# Patient Record
Sex: Female | Born: 1945
Health system: Southern US, Community
[De-identification: ages and names within clinical notes are randomized; demographics above are authoritative.]

## PROBLEM LIST (undated history)

## (undated) DIAGNOSIS — E669 Obesity, unspecified: Secondary | ICD-10-CM

## (undated) DIAGNOSIS — E785 Hyperlipidemia, unspecified: Secondary | ICD-10-CM

## (undated) DIAGNOSIS — T7840XA Allergy, unspecified, initial encounter: Secondary | ICD-10-CM

## (undated) DIAGNOSIS — N189 Chronic kidney disease, unspecified: Secondary | ICD-10-CM

## (undated) DIAGNOSIS — I10 Essential (primary) hypertension: Secondary | ICD-10-CM

## (undated) DIAGNOSIS — M1990 Unspecified osteoarthritis, unspecified site: Secondary | ICD-10-CM

## (undated) DIAGNOSIS — K635 Polyp of colon: Secondary | ICD-10-CM

## (undated) DIAGNOSIS — H269 Unspecified cataract: Secondary | ICD-10-CM

## (undated) HISTORY — DX: Chronic kidney disease, unspecified: N18.9

## (undated) HISTORY — DX: Unspecified osteoarthritis, unspecified site: M19.90

## (undated) HISTORY — DX: Obesity, unspecified: E66.9

## (undated) HISTORY — PX: COLON SURGERY: SHX602

## (undated) HISTORY — DX: Essential (primary) hypertension: I10

## (undated) HISTORY — PX: ABDOMINAL HYSTERECTOMY: SHX81

## (undated) HISTORY — DX: Unspecified cataract: H26.9

## (undated) HISTORY — DX: Polyp of colon: K63.5

## (undated) HISTORY — DX: Hyperlipidemia, unspecified: E78.5

## (undated) HISTORY — DX: Allergy, unspecified, initial encounter: T78.40XA

---

## 1998-06-12 ENCOUNTER — Ambulatory Visit (HOSPITAL_COMMUNITY): Admission: RE | Admit: 1998-06-12 | Discharge: 1998-06-12 | Payer: Self-pay | Admitting: Gastroenterology

## 2005-02-10 ENCOUNTER — Ambulatory Visit: Payer: Self-pay | Admitting: Unknown Physician Specialty

## 2005-12-12 ENCOUNTER — Other Ambulatory Visit: Payer: Self-pay

## 2005-12-25 ENCOUNTER — Ambulatory Visit: Payer: Self-pay | Admitting: Unknown Physician Specialty

## 2006-03-02 ENCOUNTER — Ambulatory Visit: Payer: Self-pay | Admitting: Unknown Physician Specialty

## 2006-05-18 ENCOUNTER — Ambulatory Visit: Payer: Self-pay | Admitting: Unknown Physician Specialty

## 2006-05-28 ENCOUNTER — Inpatient Hospital Stay: Payer: Self-pay | Admitting: Unknown Physician Specialty

## 2007-04-26 ENCOUNTER — Ambulatory Visit: Payer: Self-pay | Admitting: Unknown Physician Specialty

## 2008-06-19 ENCOUNTER — Ambulatory Visit: Payer: Self-pay | Admitting: Unknown Physician Specialty

## 2008-09-21 LAB — HM PAP SMEAR: HM Pap smear: NORMAL

## 2009-06-22 ENCOUNTER — Ambulatory Visit: Payer: Self-pay | Admitting: Unknown Physician Specialty

## 2009-07-22 LAB — HM COLONOSCOPY: HM Colonoscopy: NORMAL

## 2009-11-18 LAB — HM DEXA SCAN: HM Dexa Scan: NORMAL

## 2010-12-23 ENCOUNTER — Encounter: Payer: Self-pay | Admitting: Internal Medicine

## 2010-12-23 ENCOUNTER — Ambulatory Visit (INDEPENDENT_AMBULATORY_CARE_PROVIDER_SITE_OTHER): Payer: BC Managed Care – PPO | Admitting: Internal Medicine

## 2010-12-23 VITALS — BP 120/80 | HR 61 | Temp 97.8°F | Resp 12 | Ht 68.5 in | Wt 275.5 lb

## 2010-12-23 DIAGNOSIS — I10 Essential (primary) hypertension: Secondary | ICD-10-CM

## 2010-12-23 DIAGNOSIS — E785 Hyperlipidemia, unspecified: Secondary | ICD-10-CM

## 2010-12-23 DIAGNOSIS — Z0000 Encounter for general adult medical examination without abnormal findings: Secondary | ICD-10-CM

## 2010-12-23 DIAGNOSIS — E669 Obesity, unspecified: Secondary | ICD-10-CM | POA: Insufficient documentation

## 2010-12-23 DIAGNOSIS — E119 Type 2 diabetes mellitus without complications: Secondary | ICD-10-CM

## 2010-12-23 LAB — COMPREHENSIVE METABOLIC PANEL
ALT: 33 U/L (ref 0–35)
Albumin: 3.5 g/dL (ref 3.5–5.2)
Alkaline Phosphatase: 71 U/L (ref 39–117)
Glucose, Bld: 144 mg/dL — ABNORMAL HIGH (ref 70–99)
Potassium: 4.2 mEq/L (ref 3.5–5.1)
Sodium: 138 mEq/L (ref 135–145)
Total Protein: 7 g/dL (ref 6.0–8.3)

## 2010-12-23 LAB — LIPID PANEL
HDL: 42.3 mg/dL (ref >39.00)
Total CHOL/HDL Ratio: 6

## 2010-12-23 LAB — CBC WITH DIFFERENTIAL/PLATELET
Basophils Absolute: 0 10*3/uL (ref 0.0–0.1)
Basophils Relative: 0.7 % (ref 0.0–3.0)
Eosinophils Relative: 3 % (ref 0.0–5.0)
HCT: 38.3 % (ref 36.0–46.0)
Hemoglobin: 13.1 g/dL (ref 12.0–15.0)
Lymphocytes Relative: 17.4 % (ref 12.0–46.0)
Lymphs Abs: 1.1 10*3/uL (ref 0.7–4.0)
Monocytes Relative: 7.1 % (ref 3.0–12.0)
Neutro Abs: 4.6 10*3/uL (ref 1.4–7.7)
RBC: 4.09 Mil/uL (ref 3.87–5.11)
RDW: 13.4 % (ref 11.5–14.6)

## 2010-12-23 LAB — HEMOGLOBIN A1C: Hgb A1c MFr Bld: 6.2 % (ref 4.6–6.5)

## 2010-12-23 LAB — MICROALBUMIN / CREATININE URINE RATIO: Microalb Creat Ratio: 0.1 mg/g (ref 0.0–30.0)

## 2010-12-23 NOTE — Patient Instructions (Signed)
[facility] [place]  Labs today.

## 2010-12-23 NOTE — Progress Notes (Signed)
Subjective:    Patient ID: Savannah Hood, female    DOB: 1945/06/14, 65 y.o.   MRN: [id number]  HPI Savannah Hood is a 65 year old female who presents to establish care. She reports a history in the past of prediabetes which was treated with medication. She reports that she lost a significant amount of weight using weight watchers and was able to come off medication for her diabetes. Subsequently, she has regained her weight and is concerned that her blood sugars may be elevated. She also notes a history of hypertension for which she takes losartan and metoprolol. She has not been regularly checking her blood pressure.  She is concerned about her weight and is interested in starting a diet and exercise program to help lose weight. She currently eats a regular diet and is sedentary.  Outpatient Encounter Prescriptions as of 12/23/2010  Medication Sig Dispense Refill  . aspirin 81 MG tablet Take 81 mg by mouth daily.        . brimonidine (ALPHAGAN P) 0.1 % SOLN Place 1 drop into both eyes 2 (two) times daily.        . Cetirizine HCl (ZYRTEC ALLERGY PO) Take 1 tablet by mouth daily.        Marland Kitchen CINNAMON PO Take 1 capsule by mouth 2 (two) times daily.        . Coenzyme Q10 (COQ10 PO) Take 1 tablet by mouth daily.        Marland Kitchen KRILL OIL PO Take 1 capsule by mouth daily.        Marland Kitchen latanoprost (XALATAN) 0.005 % ophthalmic solution Place 1 drop into both eyes daily.        Marland Kitchen losartan-hydrochlorothiazide (HYZAAR) 100-25 MG per tablet Take 1 tablet by mouth daily.        . LUTEIN PO Take 1 tablet by mouth daily.        . metoprolol (TOPROL-XL) 50 MG 24 hr tablet Take 50 mg by mouth daily.        . Misc Natural Products (TART CHERRY ADVANCED PO) Take 1 capsule by mouth 2 (two) times daily.        . Multiple Vitamin (MULTIVITAMIN) capsule Take 1 capsule by mouth daily.        . Multiple Vitamins-Minerals (PRESERVISION AREDS PO) Take 1 tablet by mouth 2 (two) times daily.        Marland Kitchen nystatin-triamcinolone (MYCOLOG  II) cream Apply 1 application topically as needed.          Review of Systems  Constitutional: Negative for fever, chills, appetite change, fatigue and unexpected weight change.  HENT: Negative for ear pain, congestion, sore throat, trouble swallowing, neck pain, voice change and sinus pressure.   Eyes: Negative for visual disturbance.  Respiratory: Negative for cough, shortness of breath, wheezing and stridor.   Cardiovascular: Negative for chest pain, palpitations and leg swelling.  Gastrointestinal: Negative for nausea, vomiting, abdominal pain, diarrhea, constipation, blood in stool, abdominal distention and anal bleeding.  Genitourinary: Negative for dysuria and flank pain.  Musculoskeletal: Positive for arthralgias. Negative for myalgias and gait problem.  Skin: Negative for color change and rash.  Neurological: Negative for dizziness and headaches.  Hematological: Negative for adenopathy. Does not bruise/bleed easily.  Psychiatric/Behavioral: Negative for suicidal ideas, sleep disturbance and dysphoric mood. The patient is not nervous/anxious.    BP 120/80  Pulse 61  Temp(Src) 97.8 F (36.6 C) (Oral)  Resp 12  Ht 5' 8.5" (1.74 m)  Wt 275 lb 8  oz (124.966 kg)  BMI 41.28 kg/m2  SpO2 98%     Objective:   Physical Exam  Constitutional: She is oriented to person, place, and time. She appears well-developed and well-nourished. No distress.  HENT:  Head: Normocephalic and atraumatic.  Right Ear: External ear normal.  Left Ear: External ear normal.  Nose: Nose normal.  Mouth/Throat: Oropharynx is clear and moist. No oropharyngeal exudate.  Eyes: Conjunctivae are normal. Pupils are equal, round, and reactive to light. Right eye exhibits no discharge. Left eye exhibits no discharge. No scleral icterus.  Neck: Normal range of motion. Neck supple. No tracheal deviation present. No thyromegaly present.  Cardiovascular: Normal rate, regular rhythm, normal heart sounds and intact  distal pulses.  Exam reveals no gallop and no friction rub.   No murmur heard. Pulmonary/Chest: Effort normal and breath sounds normal. No respiratory distress. She has no wheezes. She has no rales. She exhibits no tenderness.  Abdominal: Soft. Bowel sounds are normal. She exhibits no distension and no mass. There is no tenderness. There is no rebound and no guarding.  Musculoskeletal: Normal range of motion. She exhibits no edema and no tenderness.  Lymphadenopathy:    She has no cervical adenopathy.  Neurological: She is alert and oriented to person, place, and time. No cranial nerve deficit. She exhibits normal muscle tone. Coordination normal.  Skin: Skin is warm and dry. No rash noted. She is not diaphoretic. No erythema. No pallor.  Psychiatric: She has a normal mood and affect. Her behavior is normal. Judgment and thought content normal.          Assessment & Plan:  1. Obesity - patient BMI elevated at 41. We discussed goal BMI of 20-25. We discussed calorie counting with a goal of 1200 calories per day for weight loss of 1-2 pounds per week. Given that we watchers worked well for her in the past I encouraged her to consider using this again. I also gave her some resources in regards to healthy diet. We discussed using appetite suppressants, however given her hypertension and use of medications is limited. We will plan to check a set blood work today including CBC, CMP, TSH, hemoglobin A1c, and lipid profile. She will followup in one month.  2. Hypertension - patient with history of hypertension. Blood pressure is currently well controlled on losartan and metoprolol. Will check renal function with labs today. She will return to clinic in one month.  3. Diabetes - patient with history of diabetes in the past. She was previously on medication for this. We'll check hemoglobin A1c with labs.  4. Health Maintenance - will set up mammogram. Will get records on previous Pap smears. Will get  records on previous vaccinations. Patient will followup for physical exam in one month.

## 2010-12-25 ENCOUNTER — Other Ambulatory Visit: Payer: Self-pay | Admitting: Internal Medicine

## 2011-01-17 ENCOUNTER — Ambulatory Visit: Payer: Self-pay | Admitting: Internal Medicine

## 2011-01-19 ENCOUNTER — Other Ambulatory Visit: Payer: Self-pay | Admitting: Internal Medicine

## 2011-01-27 ENCOUNTER — Encounter: Payer: Self-pay | Admitting: Internal Medicine

## 2011-01-27 ENCOUNTER — Ambulatory Visit (INDEPENDENT_AMBULATORY_CARE_PROVIDER_SITE_OTHER): Payer: BC Managed Care – PPO | Admitting: Internal Medicine

## 2011-01-27 VITALS — BP 122/78 | HR 60 | Temp 97.6°F | Wt 271.0 lb

## 2011-01-27 DIAGNOSIS — R739 Hyperglycemia, unspecified: Secondary | ICD-10-CM

## 2011-01-27 DIAGNOSIS — R7309 Other abnormal glucose: Secondary | ICD-10-CM

## 2011-01-27 DIAGNOSIS — N289 Disorder of kidney and ureter, unspecified: Secondary | ICD-10-CM

## 2011-01-27 DIAGNOSIS — Z23 Encounter for immunization: Secondary | ICD-10-CM

## 2011-01-27 DIAGNOSIS — H6690 Otitis media, unspecified, unspecified ear: Secondary | ICD-10-CM

## 2011-01-27 DIAGNOSIS — E785 Hyperlipidemia, unspecified: Secondary | ICD-10-CM

## 2011-01-27 DIAGNOSIS — I10 Essential (primary) hypertension: Secondary | ICD-10-CM

## 2011-01-27 MED ORDER — NYSTATIN-TRIAMCINOLONE 100000-0.1 UNIT/GM-% EX CREA: TOPICAL_CREAM | Freq: Two times a day (BID) | CUTANEOUS | Status: DC

## 2011-01-27 MED ORDER — METOPROLOL SUCCINATE ER 50 MG PO TB24: 50.0000 mg | ORAL_TABLET | Freq: Every day | ORAL | Status: DC

## 2011-01-27 MED ORDER — AMOXICILLIN-POT CLAVULANATE 875-125 MG PO TABS: 1.0000 | ORAL_TABLET | Freq: Two times a day (BID) | ORAL | Status: AC

## 2011-01-27 MED ORDER — LOSARTAN POTASSIUM 100 MG PO TABS: 100.0000 mg | ORAL_TABLET | Freq: Every day | ORAL | Status: DC

## 2011-01-27 NOTE — Patient Instructions (Addendum)
Stop Hyzaar. Start Cozaar. Monitor blood pressure. Call if blood pressure >140/90 consistently.  Increase fluid intake. Decrease intake of saturated fats and sugar. Increase fiber intake. Start antibiotics for ear infection. Call if fever >101F, or worsening pain. Labs in 3 months.

## 2011-01-27 NOTE — Progress Notes (Signed)
Subjective:    Patient ID: Savannah Hood, female    DOB: 07-13-1945, 65 y.o.   MRN: [id number]  HPI 65 year old female presents for her physical exam. She reports that she has been feeling well she denies any complaints today with the exception of some mild left ear pain. She denies any congestion. She denies any fever.  Today we reviewed her lab work from her previous visit which showed an elevated fasting blood sugar at 144 and elevated hemoglobin A1c at 6.2. We discussed that this was consistent with prediabetes. We also reviewed her recent cholesterol numbers including elevated triglycerides which were greater than 300. She reports that she has been on cholesterol medication the past. We also discussed elevated creatinine level on recent labs of 1.3. We discussed that this may be related to dehydration. She has been taking hydrochlorothiazide as part of her regimen for high blood pressure. She reports full compliance with his meds. She also notes that she has limited intake of fluids and often forgets to drink liquids.  Outpatient Encounter Prescriptions as of 01/27/2011  Medication Sig Dispense Refill  . aspirin 81 MG tablet Take 81 mg by mouth daily.        . brimonidine (ALPHAGAN P) 0.1 % SOLN Place 1 drop into both eyes 2 (two) times daily.        Marland Kitchen CINNAMON PO Take 1 capsule by mouth 2 (two) times daily.        . Coenzyme Q10 (COQ10 PO) Take 1 tablet by mouth daily.        Marland Kitchen KRILL OIL PO Take 1 capsule by mouth daily.        Marland Kitchen latanoprost (XALATAN) 0.005 % ophthalmic solution Place 1 drop into both eyes daily.        Marland Kitchen loratadine (CLARITIN) 10 MG tablet Take 10 mg by mouth daily.        . LUTEIN PO Take 1 tablet by mouth daily.        . metoprolol (TOPROL-XL) 50 MG 24 hr tablet Take 1 tablet (50 mg total) by mouth daily.  90 tablet  3  . Misc Natural Products (TART CHERRY ADVANCED PO) Take 1 capsule by mouth 2 (two) times daily.        Marland Kitchen nystatin-triamcinolone (MYCOLOG II) cream Apply  topically 2 (two) times daily.  120 g  3    Review of Systems  Constitutional: Negative for fever, chills, appetite change, fatigue and unexpected weight change.  HENT: Positive for ear pain. Negative for congestion, sore throat, trouble swallowing, neck pain, voice change and sinus pressure.   Eyes: Negative for visual disturbance.  Respiratory: Negative for cough, shortness of breath, wheezing and stridor.   Cardiovascular: Negative for chest pain, palpitations and leg swelling.  Gastrointestinal: Negative for nausea, vomiting, abdominal pain, diarrhea, constipation, blood in stool, abdominal distention and anal bleeding.  Genitourinary: Negative for dysuria and flank pain.  Musculoskeletal: Negative for myalgias, arthralgias and gait problem.  Skin: Negative for color change and rash.  Neurological: Negative for dizziness and headaches.  Hematological: Negative for adenopathy. Does not bruise/bleed easily.  Psychiatric/Behavioral: Negative for suicidal ideas, sleep disturbance and dysphoric mood. The patient is not nervous/anxious.    BP 122/78  Pulse 60  Temp(Src) 97.6 F (36.4 C) (Oral)  Wt 271 lb (122.925 kg)  SpO2 96%     Objective:   Physical Exam  Constitutional: She is oriented to person, place, and time. She appears well-developed and well-nourished. No distress.  HENT:  Head: Normocephalic and atraumatic.  Right Ear: Tympanic membrane, external ear and ear canal normal.  Left Ear: External ear normal. Tympanic membrane is injected. A middle ear effusion is present.  Nose: Nose normal.  Mouth/Throat: Oropharynx is clear and moist. No oropharyngeal exudate.  Eyes: Conjunctivae are normal. Pupils are equal, round, and reactive to light. Right eye exhibits no discharge. Left eye exhibits no discharge. No scleral icterus.  Neck: Normal range of motion. Neck supple. No tracheal deviation present. No thyromegaly present.  Cardiovascular: Normal rate, regular rhythm, normal  heart sounds and intact distal pulses.  Exam reveals no gallop and no friction rub.   No murmur heard. Pulmonary/Chest: Effort normal and breath sounds normal. No respiratory distress. She has no wheezes. She has no rales. She exhibits no tenderness. Right breast exhibits no inverted nipple, no mass, no nipple discharge, no skin change and no tenderness. Left breast exhibits no mass, no nipple discharge, no skin change and no tenderness. Breasts are symmetrical.  Musculoskeletal: Normal range of motion. She exhibits no edema and no tenderness.  Lymphadenopathy:    She has no cervical adenopathy.  Neurological: She is alert and oriented to person, place, and time. No cranial nerve deficit. She exhibits normal muscle tone. Coordination normal.  Skin: Skin is warm and dry. No rash noted. She is not diaphoretic. No erythema. No pallor.  Psychiatric: She has a normal mood and affect. Her behavior is normal. Judgment and thought content normal.          Assessment & Plan:  1. General exam -exam including breast exam is normal today. Pap was deferred because patient is status post complete hysterectomy. We reviewed recent labs as above.  2. Otitis media - exam remarkable for left otitis media. Will treat with Augmentin. Patient will call if symptoms are not improving.  3. Hyperglycemia -we discussed hyperglycemia and that patient's fasting blood sugar was consistent with diabetes. She is making effort at improving diet by reducing sugar in her diet and increasing her intake of fiber. We will plan to repeat her fasting blood sugar and hemoglobin A1c in 3 months.  4. Hypertriglyceridemia -we discussed elevated triglycerides. We discussed increasing fiber and decreasing saturated fat in her diet. We also discussed him creasing physical activity. We will plan to repeat lipids in 3 months.  5. Renal insufficiency -we discussed elevated creatinine on labs. This seems most consistent with dehydration. Will  try stopping her hydrochlorothiazide and repeating renal function in 3 months.  6. Hypertension - as above, will change Hyzaar to Cozaar, removing hydrochlorothiazide because of dehydration on recent labs. She will continue to monitor blood pressure and call if blood pressure greater than 140/90.

## 2011-02-12 ENCOUNTER — Encounter: Payer: Self-pay | Admitting: Internal Medicine

## 2011-04-09 ENCOUNTER — Ambulatory Visit (INDEPENDENT_AMBULATORY_CARE_PROVIDER_SITE_OTHER): Payer: BC Managed Care – PPO | Admitting: *Deleted

## 2011-04-09 VITALS — BP 186/90 | HR 64 | Wt 263.0 lb

## 2011-04-09 DIAGNOSIS — I10 Essential (primary) hypertension: Secondary | ICD-10-CM

## 2011-04-09 MED ORDER — AMLODIPINE BESYLATE 10 MG PO TABS: 10.0000 mg | ORAL_TABLET | Freq: Every day | ORAL | Status: DC

## 2011-04-09 NOTE — Progress Notes (Signed)
Pt came in for BP check.  It was 177/84 at her pharmacy yesterday.  Today it is 186/90 in office.  Per Dr. Dan Humphreys, instructed pt to add amlodipine 5 mg's to other meds she is taking and come back in one week for recheck.  Medicine sent to [facility].

## 2011-05-05 ENCOUNTER — Encounter: Payer: Self-pay | Admitting: Internal Medicine

## 2011-05-05 ENCOUNTER — Ambulatory Visit (INDEPENDENT_AMBULATORY_CARE_PROVIDER_SITE_OTHER): Payer: BC Managed Care – PPO | Admitting: Internal Medicine

## 2011-05-05 DIAGNOSIS — E785 Hyperlipidemia, unspecified: Secondary | ICD-10-CM

## 2011-05-05 DIAGNOSIS — H6692 Otitis media, unspecified, left ear: Secondary | ICD-10-CM

## 2011-05-05 DIAGNOSIS — E119 Type 2 diabetes mellitus without complications: Secondary | ICD-10-CM

## 2011-05-05 DIAGNOSIS — H6690 Otitis media, unspecified, unspecified ear: Secondary | ICD-10-CM

## 2011-05-05 DIAGNOSIS — I10 Essential (primary) hypertension: Secondary | ICD-10-CM

## 2011-05-05 DIAGNOSIS — R7309 Other abnormal glucose: Secondary | ICD-10-CM

## 2011-05-05 DIAGNOSIS — E669 Obesity, unspecified: Secondary | ICD-10-CM

## 2011-05-05 LAB — COMPREHENSIVE METABOLIC PANEL
ALT: 83 U/L — ABNORMAL HIGH (ref 0–35)
Alkaline Phosphatase: 104 U/L (ref 39–117)
CO2: 25 mEq/L (ref 19–32)
Creatinine, Ser: 1 mg/dL (ref 0.4–1.2)
GFR: 58.03 mL/min — ABNORMAL LOW (ref >60.00)
Glucose, Bld: 116 mg/dL — ABNORMAL HIGH (ref 70–99)
Sodium: 134 mEq/L — ABNORMAL LOW (ref 135–145)
Total Bilirubin: 0.6 mg/dL (ref 0.3–1.2)

## 2011-05-05 LAB — LIPID PANEL
Cholesterol: 233 mg/dL — ABNORMAL HIGH (ref 0–200)
HDL: 46.8 mg/dL (ref >39.00)
Total CHOL/HDL Ratio: 5
VLDL: 31.2 mg/dL (ref 0.0–40.0)

## 2011-05-05 LAB — LDL CHOLESTEROL, DIRECT: Direct LDL: 150.5 mg/dL

## 2011-05-05 MED ORDER — AMOXICILLIN-POT CLAVULANATE 875-125 MG PO TABS: 1.0000 | ORAL_TABLET | Freq: Two times a day (BID) | ORAL | Status: AC

## 2011-05-05 NOTE — Assessment & Plan Note (Signed)
Blood pressure is well-controlled today. We'll check renal function with labs. Followup 6 months.

## 2011-05-05 NOTE — Assessment & Plan Note (Signed)
Patient reports making significant improvement in her diet. However, weight is unchanged. Encouraged continued efforts at improvement dietary choices and regular physical activity.

## 2011-05-05 NOTE — Progress Notes (Signed)
Subjective:    Patient ID: Savannah Hood, female    DOB: April 09, 1945, 66 y.o.   MRN: [id number]  HPI 66 year old female with history of hypertension, hyperlipidemia, diabetes, obesity presents for followup. She reports that generally she is doing well. In regards to her hypertension, she notes significant improvement in her blood pressures with the addition of amlodipine. She denies any chest pain, palpitations, headache.  In regards to her hyperlipidemia, elevated blood sugars, and obesity, she notes taking some efforts at improving her diet over the last few months. She is interested in having her A1c and cholesterol rechecked today. She has not had any significant weight loss. She continues to be sedentary.  She notes a one-week history of nasal congestion and left ear pain. She reports that she's been using over-the-counter Claritin with minimal improvement. She tried changing to [employer] with no improvement as well. She denies any cough, shortness of breath, fever, or chills. She notes a history of otitis media in her left ear this fall.  Outpatient Encounter Prescriptions as of 05/05/2011  Medication Sig Dispense Refill  . Alpha-Lipoic Acid 200 MG TABS Take by mouth daily.      Marland Kitchen amLODipine (NORVASC) 10 MG tablet Take 1 tablet (10 mg total) by mouth daily.  30 tablet  1  . aspirin 81 MG tablet Take 81 mg by mouth daily.        . brimonidine (ALPHAGAN P) 0.1 % SOLN Place 1 drop into both eyes 2 (two) times daily.        . cholecalciferol (VITAMIN D) 1000 UNITS tablet Take 2,000 Units by mouth daily.      Marland Kitchen CINNAMON PO Take 1 capsule by mouth 2 (two) times daily.        . Coenzyme Q10 (COQ10 PO) Take 1 tablet by mouth daily.        . fexofenadine (ALLEGRA) 180 MG tablet Take 180 mg by mouth daily.      Marland Kitchen KRILL OIL PO Take 1 capsule by mouth daily.        Marland Kitchen latanoprost (XALATAN) 0.005 % ophthalmic solution Place 1 drop into both eyes daily.        Marland Kitchen losartan (COZAAR) 100 MG tablet Take 1  tablet (100 mg total) by mouth daily.  90 tablet  4  . LUTEIN PO Take 1 tablet by mouth daily.        . metoprolol (TOPROL-XL) 50 MG 24 hr tablet Take 1 tablet (50 mg total) by mouth daily.  90 tablet  3  . Misc Natural Products (TART CHERRY ADVANCED PO) Take 1 capsule by mouth 2 (two) times daily.        . Multiple Vitamins-Minerals (ICAPS MV) TABS Take by mouth daily.      Marland Kitchen nystatin-triamcinolone (MYCOLOG II) cream Apply topically 2 (two) times daily.  120 g  3  . amoxicillin-clavulanate (AUGMENTIN) 875-125 MG per tablet Take 1 tablet by mouth 2 (two) times daily.  20 tablet  0  . DISCONTD: loratadine (CLARITIN) 10 MG tablet Take 10 mg by mouth daily.          Review of Systems  Constitutional: Negative for fever, chills, appetite change, fatigue and unexpected weight change.  HENT: Positive for ear pain and congestion. Negative for sore throat, rhinorrhea, trouble swallowing, neck pain, voice change, postnasal drip and sinus pressure.   Eyes: Negative for visual disturbance.  Respiratory: Negative for cough, shortness of breath, wheezing and stridor.   Cardiovascular: Negative for chest  pain, palpitations and leg swelling.  Gastrointestinal: Negative for nausea, vomiting, abdominal pain, diarrhea, constipation, blood in stool, abdominal distention and anal bleeding.  Genitourinary: Negative for dysuria and flank pain.  Musculoskeletal: Negative for myalgias, arthralgias and gait problem.  Skin: Negative for color change and rash.  Neurological: Negative for dizziness and headaches.  Hematological: Negative for adenopathy. Does not bruise/bleed easily.  Psychiatric/Behavioral: Negative for suicidal ideas, sleep disturbance and dysphoric mood. The patient is not nervous/anxious.    BP 118/70  Pulse 78  Temp(Src) 97.4 F (36.3 C) (Oral)  Ht 5\' 7"  (1.702 m)  Wt 272 lb (123.378 kg)  BMI 42.60 kg/m2  SpO2 100%     Objective:   Physical Exam  Constitutional: She is oriented to  person, place, and time. She appears well-developed and well-nourished. No distress.  HENT:  Head: Normocephalic and atraumatic.  Right Ear: External ear normal. Tympanic membrane is not erythematous. No middle ear effusion.  Left Ear: External ear normal. Tympanic membrane is erythematous. A middle ear effusion is present.  Nose: Nose normal.  Mouth/Throat: Oropharynx is clear and moist. No oropharyngeal exudate.  Eyes: Conjunctivae are normal. Pupils are equal, round, and reactive to light. Right eye exhibits no discharge. Left eye exhibits no discharge. No scleral icterus.  Neck: Normal range of motion. Neck supple. No tracheal deviation present. No thyromegaly present.  Cardiovascular: Normal rate, regular rhythm, normal heart sounds and intact distal pulses.  Exam reveals no gallop and no friction rub.   No murmur heard. Pulmonary/Chest: Effort normal and breath sounds normal. No respiratory distress. She has no wheezes. She has no rales. She exhibits no tenderness.  Musculoskeletal: Normal range of motion. She exhibits no edema and no tenderness.  Lymphadenopathy:    She has no cervical adenopathy.  Neurological: She is alert and oriented to person, place, and time. No cranial nerve deficit. She exhibits normal muscle tone. Coordination normal.  Skin: Skin is warm and dry. No rash noted. She is not diaphoretic. No erythema. No pallor.  Psychiatric: She has a normal mood and affect. Her behavior is normal. Judgment and thought content normal.          Assessment & Plan:

## 2011-05-05 NOTE — Assessment & Plan Note (Signed)
Noted on labs in November 2012. Patient has made significant changes to her diet. Will recheck lipids today. Goal LDL less than 100.

## 2011-05-05 NOTE — Assessment & Plan Note (Signed)
Symptoms and exam consistent with left otitis media. Patient feels that symptoms are gradually improving. She will continue to monitor. If symptoms persist over the next 48 hours or worsen, she will start Augmentin twice daily for 10 days. She will call if symptoms do not resolve.

## 2011-05-05 NOTE — Assessment & Plan Note (Signed)
Last A1c was elevated at 6.2%. Will check A1c today. Patient has made significant changes in her diet. Followup in 6 months or sooner if needed.

## 2011-05-07 ENCOUNTER — Encounter: Payer: Self-pay | Admitting: Internal Medicine

## 2011-06-06 ENCOUNTER — Other Ambulatory Visit: Payer: Self-pay | Admitting: Internal Medicine

## 2011-07-07 ENCOUNTER — Other Ambulatory Visit: Payer: Self-pay | Admitting: *Deleted

## 2011-07-07 MED ORDER — NYSTATIN-TRIAMCINOLONE 100000-0.1 UNIT/GM-% EX CREA: TOPICAL_CREAM | Freq: Two times a day (BID) | CUTANEOUS | Status: DC

## 2011-07-11 LAB — HM MAMMOGRAPHY: HM Mammogram: NORMAL

## 2011-08-02 ENCOUNTER — Other Ambulatory Visit: Payer: Self-pay | Admitting: Internal Medicine

## 2011-09-28 ENCOUNTER — Other Ambulatory Visit: Payer: Self-pay | Admitting: Internal Medicine

## 2011-11-20 ENCOUNTER — Ambulatory Visit (INDEPENDENT_AMBULATORY_CARE_PROVIDER_SITE_OTHER): Payer: BC Managed Care – PPO | Admitting: Internal Medicine

## 2011-11-20 ENCOUNTER — Encounter: Payer: Self-pay | Admitting: Internal Medicine

## 2011-11-20 VITALS — BP 102/70 | HR 59 | Temp 97.9°F | Ht 67.0 in | Wt 263.0 lb

## 2011-11-20 DIAGNOSIS — Z2911 Encounter for prophylactic immunotherapy for respiratory syncytial virus (RSV): Secondary | ICD-10-CM

## 2011-11-20 DIAGNOSIS — Z23 Encounter for immunization: Secondary | ICD-10-CM

## 2011-11-20 DIAGNOSIS — R739 Hyperglycemia, unspecified: Secondary | ICD-10-CM

## 2011-11-20 DIAGNOSIS — I10 Essential (primary) hypertension: Secondary | ICD-10-CM

## 2011-11-20 DIAGNOSIS — E669 Obesity, unspecified: Secondary | ICD-10-CM

## 2011-11-20 DIAGNOSIS — E785 Hyperlipidemia, unspecified: Secondary | ICD-10-CM

## 2011-11-20 DIAGNOSIS — E7849 Other hyperlipidemia: Secondary | ICD-10-CM | POA: Insufficient documentation

## 2011-11-20 DIAGNOSIS — R7309 Other abnormal glucose: Secondary | ICD-10-CM

## 2011-11-20 DIAGNOSIS — R7303 Prediabetes: Secondary | ICD-10-CM | POA: Insufficient documentation

## 2011-11-20 LAB — COMPREHENSIVE METABOLIC PANEL
AST: 27 U/L (ref 0–37)
Albumin: 3.8 g/dL (ref 3.5–5.2)
Alkaline Phosphatase: 95 U/L (ref 39–117)
Potassium: 4.2 mEq/L (ref 3.5–5.1)
Sodium: 139 mEq/L (ref 135–145)
Total Protein: 7 g/dL (ref 6.0–8.3)

## 2011-11-20 LAB — HEMOGLOBIN A1C: Hgb A1c MFr Bld: 5.7 % (ref 4.6–6.5)

## 2011-11-20 LAB — LIPID PANEL: Total CHOL/HDL Ratio: 6

## 2011-11-20 NOTE — Patient Instructions (Signed)
[email or web address]  

## 2011-11-20 NOTE — Assessment & Plan Note (Signed)
Blood sugars have been slightly elevated with A1c in the prediabetes range. Encourage continued efforts at healthy diet and regular physical activity. Followup 6 months.

## 2011-11-20 NOTE — Assessment & Plan Note (Signed)
Blood pressure well-controlled on current medications. Will plan to continue. Recent renal function was normal. Followup 6 months.

## 2011-11-20 NOTE — Assessment & Plan Note (Signed)
Congratulated patient on nearly 10 pound weight loss. Encouraged her to continue efforts at healthy diet and regular physical activity. Followup 6 months.

## 2011-11-20 NOTE — Progress Notes (Signed)
Subjective:    Patient ID: Savannah Hood, female    DOB: 17-Apr-1945, 66 y.o.   MRN: [id number]  HPI 66 year old female with history of hypertension, hyperlipidemia, elevated blood sugars, and obesity presents for followup. She reports she is generally doing well. She has been following [employer] for the last several months and has lost nearly 10 pounds. She is also trying to incorporate more physical activity. In regards to hypertension, she reports full compliance with medication. She denies any chest pain, palpitations, headache. She has no new concerns today.  Outpatient Encounter Prescriptions as of 11/20/2011  Medication Sig Dispense Refill  . Alpha-Lipoic Acid 200 MG TABS Take by mouth daily.      Marland Kitchen amLODipine (NORVASC) 10 MG tablet TAKE 1 TABLET BY MOUTH EVERY DAY  30 tablet  1  . aspirin 81 MG tablet Take 81 mg by mouth daily.        . brimonidine (ALPHAGAN P) 0.1 % SOLN Place 1 drop into both eyes 2 (two) times daily.        . cholecalciferol (VITAMIN D) 1000 UNITS tablet Take 2,000 Units by mouth daily.      Marland Kitchen CINNAMON PO Take 1 capsule by mouth 2 (two) times daily.        . Coenzyme Q10 (COQ10 PO) Take 1 tablet by mouth daily.        . fexofenadine (ALLEGRA) 180 MG tablet Take 180 mg by mouth daily.      Marland Kitchen KRILL OIL PO Take 1 capsule by mouth daily.        Marland Kitchen latanoprost (XALATAN) 0.005 % ophthalmic solution Place 1 drop into both eyes daily.        Marland Kitchen losartan (COZAAR) 100 MG tablet Take 1 tablet (100 mg total) by mouth daily.  90 tablet  4  . LUTEIN PO Take 1 tablet by mouth daily.        . metoprolol (TOPROL-XL) 50 MG 24 hr tablet Take 1 tablet (50 mg total) by mouth daily.  90 tablet  3  . Misc Natural Products (TART CHERRY ADVANCED PO) Take 1 capsule by mouth 2 (two) times daily.        . Multiple Vitamins-Minerals (ICAPS MV) TABS Take by mouth daily.      Marland Kitchen nystatin-triamcinolone (MYCOLOG II) cream APPLY TOPICALLY TWICE A DAY  120 g  3    Review of Systems    Constitutional: Negative for fever, chills, appetite change, fatigue and unexpected weight change.  HENT: Negative for ear pain, congestion, sore throat, trouble swallowing, neck pain, voice change and sinus pressure.   Eyes: Negative for visual disturbance.  Respiratory: Negative for cough, shortness of breath, wheezing and stridor.   Cardiovascular: Negative for chest pain, palpitations and leg swelling.  Gastrointestinal: Negative for nausea, vomiting, abdominal pain, diarrhea, constipation, blood in stool, abdominal distention and anal bleeding.  Genitourinary: Negative for dysuria and flank pain.  Musculoskeletal: Negative for myalgias, arthralgias and gait problem.  Skin: Negative for color change and rash.  Neurological: Negative for dizziness and headaches.  Hematological: Negative for adenopathy. Does not bruise/bleed easily.  Psychiatric/Behavioral: Negative for suicidal ideas, disturbed wake/sleep cycle and dysphoric mood. The patient is not nervous/anxious.        Objective:   Physical Exam  Constitutional: She is oriented to person, place, and time. She appears well-developed and well-nourished. No distress.  HENT:  Head: Normocephalic and atraumatic.  Right Ear: External ear normal.  Left Ear: External ear normal.  Nose: Nose normal.  Mouth/Throat: Oropharynx is clear and moist. No oropharyngeal exudate.  Eyes: Conjunctivae are normal. Pupils are equal, round, and reactive to light. Right eye exhibits no discharge. Left eye exhibits no discharge. No scleral icterus.  Neck: Normal range of motion. Neck supple. No tracheal deviation present. No thyromegaly present.  Cardiovascular: Normal rate, regular rhythm, normal heart sounds and intact distal pulses.  Exam reveals no gallop and no friction rub.   No murmur heard. Pulmonary/Chest: Effort normal and breath sounds normal. No respiratory distress. She has no wheezes. She has no rales. She exhibits no tenderness.   Musculoskeletal: Normal range of motion. She exhibits no edema and no tenderness.  Lymphadenopathy:    She has no cervical adenopathy.  Neurological: She is alert and oriented to person, place, and time. No cranial nerve deficit. She exhibits normal muscle tone. Coordination normal.  Skin: Skin is warm and dry. No rash noted. She is not diaphoretic. No erythema. No pallor.  Psychiatric: She has a normal mood and affect. Her behavior is normal. Judgment and thought content normal.          Assessment & Plan:

## 2011-11-20 NOTE — Assessment & Plan Note (Signed)
Lipids elevated above goal with LDL 150. Patient is working on dietary and exercise interventions in an effort to lose weight and improve cholesterol. We'll plan to repeat lipids in 6 months.

## 2011-11-21 ENCOUNTER — Encounter: Payer: Self-pay | Admitting: Internal Medicine

## 2011-11-28 ENCOUNTER — Other Ambulatory Visit: Payer: Self-pay | Admitting: Internal Medicine

## 2011-12-30 ENCOUNTER — Other Ambulatory Visit: Payer: Self-pay | Admitting: Internal Medicine

## 2012-01-11 ENCOUNTER — Other Ambulatory Visit: Payer: Self-pay | Admitting: Internal Medicine

## 2012-01-29 ENCOUNTER — Other Ambulatory Visit: Payer: Self-pay | Admitting: Internal Medicine

## 2012-03-11 ENCOUNTER — Telehealth: Payer: Self-pay | Admitting: Internal Medicine

## 2012-03-11 NOTE — Telephone Encounter (Signed)
Losartan Potassium 100 mg tablet  Take 1 tablet by mouth every   # 90

## 2012-03-12 MED ORDER — LOSARTAN POTASSIUM 100 MG PO TABS: 100.0000 mg | ORAL_TABLET | Freq: Every day | ORAL | Status: DC

## 2012-03-12 NOTE — Telephone Encounter (Signed)
Med filled.  

## 2012-05-08 ENCOUNTER — Other Ambulatory Visit: Payer: Self-pay

## 2012-05-26 ENCOUNTER — Encounter: Payer: BC Managed Care – PPO | Admitting: Internal Medicine

## 2012-07-13 ENCOUNTER — Other Ambulatory Visit: Payer: Self-pay | Admitting: Internal Medicine

## 2012-08-09 ENCOUNTER — Encounter: Payer: Self-pay | Admitting: Internal Medicine

## 2012-08-09 ENCOUNTER — Ambulatory Visit (INDEPENDENT_AMBULATORY_CARE_PROVIDER_SITE_OTHER): Payer: BC Managed Care – PPO | Admitting: Internal Medicine

## 2012-08-09 VITALS — BP 110/72 | HR 56 | Temp 97.8°F | Ht 67.5 in | Wt 270.0 lb

## 2012-08-09 DIAGNOSIS — E669 Obesity, unspecified: Secondary | ICD-10-CM

## 2012-08-09 DIAGNOSIS — I10 Essential (primary) hypertension: Secondary | ICD-10-CM

## 2012-08-09 DIAGNOSIS — B3789 Other sites of candidiasis: Secondary | ICD-10-CM | POA: Insufficient documentation

## 2012-08-09 DIAGNOSIS — Z0000 Encounter for general adult medical examination without abnormal findings: Secondary | ICD-10-CM

## 2012-08-09 DIAGNOSIS — B372 Candidiasis of skin and nail: Secondary | ICD-10-CM

## 2012-08-09 LAB — HM PAP SMEAR

## 2012-08-09 LAB — COMPREHENSIVE METABOLIC PANEL
ALT: 27 U/L (ref 0–35)
AST: 23 U/L (ref 0–37)
Albumin: 3.5 g/dL (ref 3.5–5.2)
Calcium: 9 mg/dL (ref 8.4–10.5)
Chloride: 104 mEq/L (ref 96–112)
Potassium: 4.4 mEq/L (ref 3.5–5.1)
Sodium: 137 mEq/L (ref 135–145)
Total Protein: 6.9 g/dL (ref 6.0–8.3)

## 2012-08-09 LAB — CBC WITH DIFFERENTIAL/PLATELET
Basophils Absolute: 0 10*3/uL (ref 0.0–0.1)
Eosinophils Relative: 2.1 % (ref 0.0–5.0)
HCT: 38.3 % (ref 36.0–46.0)
Lymphocytes Relative: 14.7 % (ref 12.0–46.0)
Lymphs Abs: 1.1 10*3/uL (ref 0.7–4.0)
Monocytes Relative: 5.4 % (ref 3.0–12.0)
Platelets: 181 10*3/uL (ref 150.0–400.0)
RDW: 13.3 % (ref 11.5–14.6)
WBC: 7.4 10*3/uL (ref 4.5–10.5)

## 2012-08-09 LAB — LIPID PANEL: HDL: 36.8 mg/dL — ABNORMAL LOW (ref >39.00)

## 2012-08-09 LAB — LDL CHOLESTEROL, DIRECT: Direct LDL: 143.3 mg/dL

## 2012-08-09 MED ORDER — NYSTATIN 100000 UNIT/GM EX POWD: Freq: Four times a day (QID) | CUTANEOUS | Status: DC

## 2012-08-09 MED ORDER — FLUCONAZOLE 150 MG PO TABS: 150.0000 mg | ORAL_TABLET | Freq: Every day | ORAL | Status: DC

## 2012-08-09 NOTE — Assessment & Plan Note (Signed)
Exam is consistent with topical candidiasis. Will change to topical nystatin powder and will start oral diflucan for 7 day course. Encouraged pt to keep area dry as much as possible. She will call or email with update later this week.

## 2012-08-09 NOTE — Assessment & Plan Note (Signed)
Wt Readings from Last 3 Encounters:  08/09/12 270 lb (122.471 kg)  11/20/11 263 lb (119.296 kg)  05/05/11 272 lb (123.378 kg)   Encouraged effort at healthy diet and prioritizing exercise such as walking with goal of 5 days per week.

## 2012-08-09 NOTE — Assessment & Plan Note (Signed)
General medical exam including breast exam normal today. PAP and pelvic deferred as pt s/p complete hysterectomy.  Mammogram ordered. Will check labs including CBC, CMP, lipids. Encouraged continued efforts at healthy diet and regular physical activity with goal of weight loss.

## 2012-08-09 NOTE — Assessment & Plan Note (Signed)
BP Readings from Last 3 Encounters:  08/09/12 110/72  11/20/11 102/70  05/05/11 118/70   BP well controlled on current medications. Will continue.

## 2012-08-09 NOTE — Progress Notes (Signed)
Subjective:    Patient ID: Savannah Hood, female    DOB: February 21, 1946, 67 y.o.   MRN: [id number]  HPI 67YO female presents for annual exam. Doing well. Only concern today is rash in left groin area. This has been present off and on for greater than 1 year. Using topical Nystatin cream with minimal improvement. Rash tends to come and go. Becomes occasionally bright red and tender. No fever, chills. Also had rash under breasts in the past, however this has cleared.  Also reports ongoing struggle with weight loss. Not following any particular diet. Not currently exercising. However, plans to start increased walking.  Outpatient Encounter Prescriptions as of 08/09/2012  Medication Sig Dispense Refill  . Alpha-Lipoic Acid 200 MG TABS Take by mouth daily.      Marland Kitchen amLODipine (NORVASC) 10 MG tablet TAKE 1 TABLET BY MOUTH EVERY DAY  30 tablet  6  . b complex vitamins capsule Take 1 capsule by mouth daily.      . brimonidine (ALPHAGAN P) 0.1 % SOLN Place 1 drop into both eyes 2 (two) times daily.        . cetirizine (ZYRTEC) 10 MG tablet Take 10 mg by mouth daily.      . cholecalciferol (VITAMIN D) 1000 UNITS tablet Take 2,000 Units by mouth daily.      Marland Kitchen CINNAMON PO Take 1 capsule by mouth 2 (two) times daily.        Marland Kitchen KRILL OIL PO Take 1 capsule by mouth daily.        Marland Kitchen latanoprost (XALATAN) 0.005 % ophthalmic solution Place 1 drop into both eyes daily.        Marland Kitchen losartan (COZAAR) 100 MG tablet Take 1 tablet (100 mg total) by mouth daily.  90 tablet  4  . LUTEIN PO Take 1 tablet by mouth daily.        . metoprolol succinate (TOPROL-XL) 50 MG 24 hr tablet TAKE 1 TABLET BY MOUTH ONCE A DAY  90 tablet  3  . Multiple Vitamins-Minerals (ICAPS MV) TABS Take by mouth daily.      Marland Kitchen nystatin-triamcinolone (MYCOLOG II) cream APPLY TOPICALLY TWICE A DAY  120 g  3  . aspirin 81 MG tablet Take 81 mg by mouth daily.        . Coenzyme Q10 (COQ10 PO) Take 1 tablet by mouth daily.        . fexofenadine (ALLEGRA) 180  MG tablet Take 180 mg by mouth daily.      . fluconazole (DIFLUCAN) 150 MG tablet Take 1 tablet (150 mg total) by mouth daily.  7 tablet  1  . Misc Natural Products (TART CHERRY ADVANCED PO) Take 1 capsule by mouth 2 (two) times daily.        Marland Kitchen nystatin (MYCOSTATIN) powder Apply topically 4 (four) times daily.  60 g  4  . nystatin-triamcinolone (MYCOLOG II) cream APPLY TO AFFECTED AREA TWICE A DAY  120 g  3   No facility-administered encounter medications on file as of 08/09/2012.   BP 110/72  Pulse 56  Temp(Src) 97.8 F (36.6 C) (Oral)  Ht 5' 7.5" (1.715 m)  Wt 270 lb (122.471 kg)  BMI 41.64 kg/m2  SpO2 97%  Review of Systems  Constitutional: Negative for fever, chills, appetite change, fatigue and unexpected weight change.  HENT: Negative for ear pain, congestion, sore throat, trouble swallowing, neck pain, voice change and sinus pressure.   Eyes: Negative for visual disturbance.  Respiratory: Negative for  cough, shortness of breath, wheezing and stridor.   Cardiovascular: Negative for chest pain, palpitations and leg swelling.  Gastrointestinal: Negative for nausea, vomiting, abdominal pain, diarrhea, constipation, blood in stool, abdominal distention and anal bleeding.  Genitourinary: Negative for dysuria and flank pain.  Musculoskeletal: Negative for myalgias, arthralgias and gait problem.  Skin: Positive for color change and rash.  Neurological: Negative for dizziness and headaches.  Hematological: Negative for adenopathy. Does not bruise/bleed easily.  Psychiatric/Behavioral: Negative for suicidal ideas, sleep disturbance and dysphoric mood. The patient is not nervous/anxious.        Objective:   Physical Exam  Constitutional: She is oriented to person, place, and time. She appears well-developed and well-nourished. No distress.  HENT:  Head: Normocephalic and atraumatic.  Right Ear: External ear normal.  Left Ear: External ear normal.  Nose: Nose normal.  Mouth/Throat:  Oropharynx is clear and moist. No oropharyngeal exudate.  Eyes: Conjunctivae are normal. Pupils are equal, round, and reactive to light. Right eye exhibits no discharge. Left eye exhibits no discharge. No scleral icterus.  Neck: Normal range of motion. Neck supple. No tracheal deviation present. No thyromegaly present.  Cardiovascular: Normal rate, regular rhythm, normal heart sounds and intact distal pulses.  Exam reveals no gallop and no friction rub.   No murmur heard. Pulmonary/Chest: Effort normal and breath sounds normal. No accessory muscle usage. Not tachypneic. No respiratory distress. She has no decreased breath sounds. She has no wheezes. She has no rales. She exhibits no tenderness. Right breast exhibits no inverted nipple, no mass, no nipple discharge, no skin change and no tenderness. Left breast exhibits no inverted nipple, no mass, no nipple discharge, no skin change and no tenderness. Breasts are symmetrical.  Abdominal: Soft. Bowel sounds are normal. She exhibits no distension and no mass. There is no tenderness. There is no rebound and no guarding.  Musculoskeletal: Normal range of motion. She exhibits no edema and no tenderness.  Lymphadenopathy:    She has no cervical adenopathy.  Neurological: She is alert and oriented to person, place, and time. No cranial nerve deficit. She exhibits normal muscle tone. Coordination normal.  Skin: Skin is warm and dry. Rash noted. Rash is macular. She is not diaphoretic. There is erythema. No pallor.     Psychiatric: She has a normal mood and affect. Her behavior is normal. Judgment and thought content normal.          Assessment & Plan:

## 2012-08-17 ENCOUNTER — Encounter: Payer: Self-pay | Admitting: Internal Medicine

## 2012-08-17 DIAGNOSIS — B372 Candidiasis of skin and nail: Secondary | ICD-10-CM

## 2012-08-17 MED ORDER — NYSTATIN 100000 UNIT/GM EX POWD: Freq: Four times a day (QID) | CUTANEOUS | Status: DC

## 2012-08-18 ENCOUNTER — Other Ambulatory Visit: Payer: Self-pay | Admitting: Internal Medicine

## 2012-09-13 ENCOUNTER — Ambulatory Visit: Payer: Self-pay | Admitting: Internal Medicine

## 2012-10-08 ENCOUNTER — Encounter: Payer: Self-pay | Admitting: Internal Medicine

## 2012-10-27 ENCOUNTER — Other Ambulatory Visit: Payer: Self-pay

## 2012-12-23 ENCOUNTER — Other Ambulatory Visit: Payer: Self-pay | Admitting: Internal Medicine

## 2013-01-04 ENCOUNTER — Other Ambulatory Visit: Payer: Self-pay | Admitting: Internal Medicine

## 2013-01-24 ENCOUNTER — Other Ambulatory Visit: Payer: Self-pay | Admitting: Internal Medicine

## 2013-01-24 NOTE — Telephone Encounter (Signed)
Eprescribed.

## 2013-01-27 ENCOUNTER — Other Ambulatory Visit: Payer: Self-pay

## 2013-02-07 ENCOUNTER — Ambulatory Visit: Payer: BC Managed Care – PPO | Admitting: Internal Medicine

## 2013-02-21 ENCOUNTER — Other Ambulatory Visit: Payer: Self-pay | Admitting: Internal Medicine

## 2013-03-09 ENCOUNTER — Ambulatory Visit: Payer: BC Managed Care – PPO | Admitting: Internal Medicine

## 2013-03-10 ENCOUNTER — Ambulatory Visit: Payer: BC Managed Care – PPO | Admitting: Internal Medicine

## 2013-03-11 ENCOUNTER — Ambulatory Visit (INDEPENDENT_AMBULATORY_CARE_PROVIDER_SITE_OTHER): Payer: BC Managed Care – PPO | Admitting: Internal Medicine

## 2013-03-11 ENCOUNTER — Encounter: Payer: Self-pay | Admitting: Internal Medicine

## 2013-03-11 VITALS — BP 110/70 | HR 61 | Temp 97.6°F | Wt 272.0 lb

## 2013-03-11 DIAGNOSIS — Z23 Encounter for immunization: Secondary | ICD-10-CM

## 2013-03-11 DIAGNOSIS — E785 Hyperlipidemia, unspecified: Secondary | ICD-10-CM

## 2013-03-11 DIAGNOSIS — I10 Essential (primary) hypertension: Secondary | ICD-10-CM

## 2013-03-11 DIAGNOSIS — E669 Obesity, unspecified: Secondary | ICD-10-CM

## 2013-03-11 DIAGNOSIS — R739 Hyperglycemia, unspecified: Secondary | ICD-10-CM

## 2013-03-11 DIAGNOSIS — R7309 Other abnormal glucose: Secondary | ICD-10-CM

## 2013-03-11 LAB — COMPREHENSIVE METABOLIC PANEL
ALT: 30 U/L (ref 0–35)
AST: 29 U/L (ref 0–37)
Alkaline Phosphatase: 80 U/L (ref 39–117)
CO2: 24 mEq/L (ref 19–32)
Calcium: 9 mg/dL (ref 8.4–10.5)
Chloride: 105 mEq/L (ref 96–112)
Creatinine, Ser: 1.2 mg/dL (ref 0.4–1.2)
GFR: 47.83 mL/min — ABNORMAL LOW (ref >60.00)
Glucose, Bld: 129 mg/dL — ABNORMAL HIGH (ref 70–99)
Sodium: 136 mEq/L (ref 135–145)
Total Bilirubin: 0.7 mg/dL (ref 0.3–1.2)
Total Protein: 6.6 g/dL (ref 6.0–8.3)

## 2013-03-11 LAB — HEMOGLOBIN A1C: Hgb A1c MFr Bld: 6.1 % (ref 4.6–6.5)

## 2013-03-11 LAB — MICROALBUMIN / CREATININE URINE RATIO
Microalb Creat Ratio: 0.1 mg/g (ref 0.0–30.0)
Microalb, Ur: 0.3 mg/dL (ref 0.0–1.9)

## 2013-03-11 NOTE — Progress Notes (Signed)
Pre-visit discussion using our clinic review tool. No additional management support is needed unless otherwise documented below in the visit note.  

## 2013-03-12 NOTE — Assessment & Plan Note (Signed)
BP Readings from Last 3 Encounters:  03/11/13 110/70  08/09/12 110/72  11/20/11 102/70   BP well controlled on current medications. Will continue.

## 2013-03-12 NOTE — Assessment & Plan Note (Signed)
Lab Results  Component Value Date   HGBA1C 6.1 03/11/2013   BG elevated in range of increased risk of DM. Encouraged healthy, low-glycemic index diet and regular exercise.

## 2013-03-12 NOTE — Assessment & Plan Note (Signed)
Wt Readings from Last 3 Encounters:  03/11/13 272 lb (123.378 kg)  08/09/12 270 lb (122.471 kg)  11/20/11 263 lb (119.296 kg)   Encouraged healthy diet and regular exercise with goal of weight loss.

## 2013-03-12 NOTE — Progress Notes (Signed)
Subjective:    Patient ID: Savannah Hood, female    DOB: 1945/10/31, 67 y.o.   MRN: [id number]  HPI 67 year old female with history of obesity, hypertension, and elevated blood sugar presents for followup. She reports that she is generally feeling well. She is compliant with her medications. No concerns today. She is planning to take a trip later this month to [place].  Outpatient Encounter Prescriptions as of 03/11/2013  Medication Sig  . Alpha-Lipoic Acid 200 MG TABS Take by mouth daily.  Marland Kitchen amLODipine (NORVASC) 10 MG tablet TAKE 1 TABLET BY MOUTH EVERY DAY  . b complex vitamins capsule Take 1 capsule by mouth daily.  . brimonidine (ALPHAGAN P) 0.1 % SOLN Place 1 drop into both eyes 2 (two) times daily.    . cetirizine (ZYRTEC) 10 MG tablet Take 10 mg by mouth daily.  . cholecalciferol (VITAMIN D) 1000 UNITS tablet Take 2,000 Units by mouth daily.  Marland Kitchen CINNAMON PO Take 1 capsule by mouth 2 (two) times daily.    . Coenzyme Q10 (COQ10 PO) Take 1 tablet by mouth daily.    Marland Kitchen KRILL OIL PO Take 1 capsule by mouth daily.    Marland Kitchen latanoprost (XALATAN) 0.005 % ophthalmic solution Place 1 drop into both eyes daily.    Marland Kitchen losartan (COZAAR) 100 MG tablet Take 1 tablet (100 mg total) by mouth daily.  . LUTEIN PO Take 1 tablet by mouth daily.    . metoprolol succinate (TOPROL-XL) 50 MG 24 hr tablet TAKE 1 TABLET BY MOUTH ONCE A DAY  . Misc Natural Products (TART CHERRY ADVANCED PO) Take 1 capsule by mouth 2 (two) times daily.    . Multiple Vitamins-Minerals (ICAPS MV) TABS Take by mouth daily.  Marland Kitchen nystatin (MYCOSTATIN/NYSTOP) 100000 UNIT/GM POWD APPLY TOPICALLY 4 (FOUR) TIMES DAILY.  Marland Kitchen nystatin-triamcinolone (MYCOLOG II) cream APPLY TOPICALLY TWICE A DAY  . aspirin 81 MG tablet Take 81 mg by mouth daily.      Review of Systems  Constitutional: Negative for fever, chills, appetite change, fatigue and unexpected weight change.  HENT: Negative for congestion, ear pain, sinus pressure, sore throat,  trouble swallowing and voice change.   Eyes: Negative for visual disturbance.  Respiratory: Negative for cough, shortness of breath, wheezing and stridor.   Cardiovascular: Negative for chest pain, palpitations and leg swelling.  Gastrointestinal: Negative for nausea, vomiting, abdominal pain, diarrhea, constipation, blood in stool, abdominal distention and anal bleeding.  Genitourinary: Negative for dysuria and flank pain.  Musculoskeletal: Negative for arthralgias, gait problem, myalgias and neck pain.  Skin: Negative for color change and rash.  Neurological: Negative for dizziness and headaches.  Hematological: Negative for adenopathy. Does not bruise/bleed easily.  Psychiatric/Behavioral: Negative for suicidal ideas, sleep disturbance and dysphoric mood. The patient is not nervous/anxious.        Objective:   Physical Exam  Constitutional: She is oriented to person, place, and time. She appears well-developed and well-nourished. No distress.  HENT:  Head: Normocephalic and atraumatic.  Right Ear: External ear normal.  Left Ear: External ear normal.  Nose: Nose normal.  Mouth/Throat: Oropharynx is clear and moist. No oropharyngeal exudate.  Eyes: Conjunctivae are normal. Pupils are equal, round, and reactive to light. Right eye exhibits no discharge. Left eye exhibits no discharge. No scleral icterus.  Neck: Normal range of motion. Neck supple. No tracheal deviation present. No thyromegaly present.  Cardiovascular: Normal rate, regular rhythm, normal heart sounds and intact distal pulses.  Exam reveals no gallop and  no friction rub.   No murmur heard. Pulmonary/Chest: Effort normal and breath sounds normal. No accessory muscle usage. Not tachypneic. No respiratory distress. She has no decreased breath sounds. She has no wheezes. She has no rhonchi. She has no rales. She exhibits no tenderness.  Musculoskeletal: Normal range of motion. She exhibits no edema and no tenderness.    Lymphadenopathy:    She has no cervical adenopathy.  Neurological: She is alert and oriented to person, place, and time. No cranial nerve deficit. She exhibits normal muscle tone. Coordination normal.  Skin: Skin is warm and dry. No rash noted. She is not diaphoretic. No erythema. No pallor.  Psychiatric: She has a normal mood and affect. Her behavior is normal. Judgment and thought content normal.          Assessment & Plan:

## 2013-03-14 ENCOUNTER — Other Ambulatory Visit: Payer: Self-pay | Admitting: Internal Medicine

## 2013-03-15 DIAGNOSIS — Z23 Encounter for immunization: Secondary | ICD-10-CM

## 2013-03-15 NOTE — Addendum Note (Signed)
Addended by: Theola Sequin on: 03/15/2013 05:54 PM   Modules accepted: Orders

## 2013-03-27 ENCOUNTER — Other Ambulatory Visit: Payer: Self-pay | Admitting: Internal Medicine

## 2013-05-25 ENCOUNTER — Other Ambulatory Visit: Payer: Self-pay | Admitting: Internal Medicine

## 2013-05-26 NOTE — Telephone Encounter (Signed)
Ok refill? 

## 2013-06-08 ENCOUNTER — Other Ambulatory Visit: Payer: Self-pay | Admitting: Internal Medicine

## 2013-06-09 NOTE — Telephone Encounter (Signed)
Ok refill? 

## 2013-07-29 ENCOUNTER — Other Ambulatory Visit: Payer: Self-pay | Admitting: Internal Medicine

## 2013-08-26 ENCOUNTER — Other Ambulatory Visit: Payer: Self-pay | Admitting: Internal Medicine

## 2013-09-01 ENCOUNTER — Other Ambulatory Visit: Payer: Self-pay | Admitting: Internal Medicine

## 2013-09-02 NOTE — Telephone Encounter (Signed)
Refill

## 2013-09-19 ENCOUNTER — Encounter: Payer: BC Managed Care – PPO | Admitting: Internal Medicine

## 2013-10-04 ENCOUNTER — Other Ambulatory Visit: Payer: Self-pay | Admitting: Internal Medicine

## 2013-10-24 ENCOUNTER — Ambulatory Visit (INDEPENDENT_AMBULATORY_CARE_PROVIDER_SITE_OTHER): Payer: BC Managed Care – PPO | Admitting: Internal Medicine

## 2013-10-24 ENCOUNTER — Encounter: Payer: Self-pay | Admitting: Internal Medicine

## 2013-10-24 VITALS — BP 124/72 | HR 60 | Temp 97.5°F | Ht 67.5 in | Wt 275.2 lb

## 2013-10-24 DIAGNOSIS — N183 Chronic kidney disease, stage 3 unspecified: Secondary | ICD-10-CM | POA: Insufficient documentation

## 2013-10-24 DIAGNOSIS — E785 Hyperlipidemia, unspecified: Secondary | ICD-10-CM

## 2013-10-24 DIAGNOSIS — E669 Obesity, unspecified: Secondary | ICD-10-CM

## 2013-10-24 DIAGNOSIS — Z0000 Encounter for general adult medical examination without abnormal findings: Secondary | ICD-10-CM | POA: Insufficient documentation

## 2013-10-24 DIAGNOSIS — E7849 Other hyperlipidemia: Secondary | ICD-10-CM

## 2013-10-24 DIAGNOSIS — I10 Essential (primary) hypertension: Secondary | ICD-10-CM

## 2013-10-24 LAB — LIPID PANEL
CHOLESTEROL: 257 mg/dL — AB (ref 0–200)
HDL: 40.2 mg/dL (ref >39.00)
NonHDL: 216.8
Total CHOL/HDL Ratio: 6
Triglycerides: 217 mg/dL — ABNORMAL HIGH (ref 0.0–149.0)
VLDL: 43.4 mg/dL — AB (ref 0.0–40.0)

## 2013-10-24 LAB — COMPREHENSIVE METABOLIC PANEL
ALBUMIN: 3.7 g/dL (ref 3.5–5.2)
ALK PHOS: 69 U/L (ref 39–117)
ALT: 18 U/L (ref 0–35)
AST: 22 U/L (ref 0–37)
BUN: 20 mg/dL (ref 6–23)
CALCIUM: 8.8 mg/dL (ref 8.4–10.5)
CO2: 26 mEq/L (ref 19–32)
Chloride: 100 mEq/L (ref 96–112)
Creatinine, Ser: 1.2 mg/dL (ref 0.4–1.2)
GFR: 48.2 mL/min — ABNORMAL LOW (ref >60.00)
GLUCOSE: 99 mg/dL (ref 70–99)
POTASSIUM: 4.2 meq/L (ref 3.5–5.1)
Sodium: 132 mEq/L — ABNORMAL LOW (ref 135–145)
Total Bilirubin: 0.7 mg/dL (ref 0.2–1.2)
Total Protein: 6.4 g/dL (ref 6.0–8.3)

## 2013-10-24 LAB — CBC WITH DIFFERENTIAL/PLATELET
Basophils Absolute: 0 10*3/uL (ref 0.0–0.1)
Basophils Relative: 0.5 % (ref 0.0–3.0)
EOS PCT: 1.7 % (ref 0.0–5.0)
Eosinophils Absolute: 0.1 10*3/uL (ref 0.0–0.7)
HCT: 40.3 % (ref 36.0–46.0)
Hemoglobin: 13.8 g/dL (ref 12.0–15.0)
LYMPHS PCT: 16.8 % (ref 12.0–46.0)
Lymphs Abs: 1.2 10*3/uL (ref 0.7–4.0)
MCHC: 34.2 g/dL (ref 30.0–36.0)
MCV: 93.8 fl (ref 78.0–100.0)
Monocytes Absolute: 0.5 10*3/uL (ref 0.1–1.0)
Monocytes Relative: 6.5 % (ref 3.0–12.0)
Neutro Abs: 5.2 10*3/uL (ref 1.4–7.7)
Neutrophils Relative %: 74.5 % (ref 43.0–77.0)
PLATELETS: 205 10*3/uL (ref 150.0–400.0)
RBC: 4.29 Mil/uL (ref 3.87–5.11)
RDW: 13.4 % (ref 11.5–15.5)
WBC: 7 10*3/uL (ref 4.0–10.5)

## 2013-10-24 LAB — MICROALBUMIN / CREATININE URINE RATIO
Creatinine,U: 53 mg/dL
Microalb Creat Ratio: 0.4 mg/g (ref 0.0–30.0)
Microalb, Ur: 0.2 mg/dL (ref 0.0–1.9)

## 2013-10-24 LAB — HEMOGLOBIN A1C: HEMOGLOBIN A1C: 6.2 % (ref 4.6–6.5)

## 2013-10-24 LAB — LDL CHOLESTEROL, DIRECT: Direct LDL: 190.9 mg/dL

## 2013-10-24 NOTE — Assessment & Plan Note (Signed)
Wt Readings from Last 3 Encounters:  10/24/13 275 lb 4 oz (124.853 kg)  03/11/13 272 lb (123.378 kg)  08/09/12 270 lb (122.471 kg)   Body mass index is 42.45 kg/(m^2). Encouraged healthy diet and regular exercise such as walking with goal of weight loss.

## 2013-10-24 NOTE — Assessment & Plan Note (Signed)
General medical exam including breast exam normal today. PAP and pelvic deferred as pt s/p complete hysterectomy. Mammogram ordered. Colonoscopy UTD. Immunizations UTD. Will check labs including CBC, CMP, lipids, A1c. Encouraged healthy diet and exercise.

## 2013-10-24 NOTE — Progress Notes (Signed)
The patient is here for annual Medicare Wellness Examination and management of other chronic and acute problems.   The risk factors are reflected in the history.  The roster of all physicians providing medical care to patient - is listed in the Snapshot section of the chart.  Activities of daily living:   The patient is 100% independent in all ADLs: dressing, toileting, feeding as well as independent mobility. Patient lives with husband. Pets - two dogs. Single story home. Carpet and tile floors.  Home safety :  The patient has smoke detectors in the home.  They wear seatbelts in their car. There are no firearms at home.  There is no violence in the home. They feel safe where they live.  Infectious Risks: There is no risks for hepatitis, STDs or HIV.  There is no  history of blood transfusion.  They have no travel history to infectious disease endemic areas of the world.  Additional Health Care Providers: The patient has seen their dentist in the last six months. Dentist - Dr. Leanor Kail, [facility] They have seen their eye doctor in the last year. Opthalmologist - Dr. Dawna Part They deny hearing issues. They have deferred audiologic testing in the last year.   They do not  have excessive sun exposure. Discussed the need for sun protection: hats,long sleeves and use of sunscreen if there is significant sun exposure.  Dermatologist - none  Diet: the importance of a healthy diet is discussed. They do have a relatively healthy diet.  The benefits of regular aerobic exercise were discussed. Patient exercises by walking almost daily.  Depression screen: there are no signs or vegative symptoms of depression- irritability, change in appetite, anhedonia, sadness/tearfullness.  Cognitive assessment: the patient manages all their financial and personal affairs and is actively engaged. They could relate day,date,year and events.  HCPOA - husband, Iona Beard Living Will - yes  The following  portions of the patient's history were reviewed and updated as appropriate: allergies, current medications, past family history, past medical history,  past surgical history, past social history and problem list.  Visual acuity was not assessed per patient preference as they have regular follow up with their ophthalmologist. Hearing and body mass index were assessed and reviewed.   During the course of the visit the patient was educated and counseled about appropriate screening and preventive services including : fall prevention , diabetes screening, nutrition counseling, colorectal cancer screening, and recommended immunizations.    Review of Systems  Constitutional: Negative for fever, chills, appetite change, fatigue and unexpected weight change.  Eyes: Negative for visual disturbance.  Respiratory: Negative for shortness of breath.   Cardiovascular: Negative for chest pain and leg swelling.  Gastrointestinal: Negative for nausea, vomiting, abdominal pain, diarrhea and blood in stool.  Musculoskeletal: Negative for arthralgias, back pain and myalgias.  Skin: Negative for color change and rash.  Hematological: Negative for adenopathy. Does not bruise/bleed easily.  Psychiatric/Behavioral: Negative for sleep disturbance and dysphoric mood. The patient is not nervous/anxious.        Objective:    BP 124/72  Pulse 60  Temp(Src) 97.5 F (36.4 C) (Oral)  Ht 5' 7.5" (1.715 m)  Wt 275 lb 4 oz (124.853 kg)  BMI 42.45 kg/m2  SpO2 97% Physical Exam  Constitutional: She is oriented to person, place, and time. She appears well-developed and well-nourished. No distress.  HENT:  Head: Normocephalic and atraumatic.  Right Ear: External ear normal.  Left Ear: External ear normal.  Nose: Nose  normal.  Mouth/Throat: Oropharynx is clear and moist. No oropharyngeal exudate.  Eyes: Conjunctivae are normal. Pupils are equal, round, and reactive to light. Right eye exhibits no discharge. Left eye  exhibits no discharge. No scleral icterus.  Neck: Normal range of motion. Neck supple. No tracheal deviation present. No thyromegaly present.  Cardiovascular: Normal rate, regular rhythm, normal heart sounds and intact distal pulses.  Exam reveals no gallop and no friction rub.   No murmur heard. Pulmonary/Chest: Effort normal and breath sounds normal. No accessory muscle usage. Not tachypneic. No respiratory distress. She has no decreased breath sounds. She has no wheezes. She has no rhonchi. She has no rales. She exhibits no tenderness. Right breast exhibits no inverted nipple, no mass, no nipple discharge, no skin change and no tenderness. Left breast exhibits no inverted nipple, no mass, no nipple discharge, no skin change and no tenderness. Breasts are symmetrical.  Abdominal: Soft. Bowel sounds are normal. She exhibits no distension and no mass. There is no tenderness. There is no rebound and no guarding.  Musculoskeletal: Normal range of motion. She exhibits no edema and no tenderness.  Lymphadenopathy:    She has no cervical adenopathy.  Neurological: She is alert and oriented to person, place, and time. No cranial nerve deficit. She exhibits normal muscle tone. Coordination normal.  Skin: Skin is warm and dry. No rash noted. She is not diaphoretic. No erythema. No pallor.  Psychiatric: She has a normal mood and affect. Her behavior is normal. Judgment and thought content normal.          Assessment & Plan:   Problem List Items Addressed This Visit     Unprioritized   CKD (chronic kidney disease) stage 3, GFR 30-59 ml/min     Chronic mild renal insufficiency noted, and stable. Likely secondary to HTN. Will continue Losartan. Plan to monitor renal function q3-6 months.    Familial hyperlipidemia, high LDL     Labs show elevated LDL 190. Recommended starting Atorvastatin 20mg  daily and rechecking lipids in 4 weeks.    Hypertension      BP Readings from Last 3 Encounters:   10/24/13 124/72  03/11/13 110/70  08/09/12 110/72   BP well controlled on current medications. Will continue.    Medicare annual wellness visit, subsequent - Primary     General medical exam including breast exam normal today. PAP and pelvic deferred as pt s/p complete hysterectomy. Mammogram ordered. Colonoscopy UTD. Immunizations UTD. Will check labs including CBC, CMP, lipids, A1c. Encouraged healthy diet and exercise.    Relevant Orders      Comprehensive metabolic panel (Completed)      Hemoglobin A1c (Completed)      Lipid panel (Completed)      Microalbumin / creatinine urine ratio (Completed)      CBC with Differential (Completed)      MM Digital Screening   Obesity      Wt Readings from Last 3 Encounters:  10/24/13 275 lb 4 oz (124.853 kg)  03/11/13 272 lb (123.378 kg)  08/09/12 270 lb (122.471 kg)   Body mass index is 42.45 kg/(m^2). Encouraged healthy diet and regular exercise such as walking with goal of weight loss.        Return in about 6 months (around 04/26/2014) for Recheck.

## 2013-10-24 NOTE — Progress Notes (Signed)
Pre visit review using our clinic review tool, if applicable. No additional management support is needed unless otherwise documented below in the visit note. 

## 2013-10-24 NOTE — Assessment & Plan Note (Signed)
BP Readings from Last 3 Encounters:  10/24/13 124/72  03/11/13 110/70  08/09/12 110/72   BP well controlled on current medications. Will continue.

## 2013-10-24 NOTE — Assessment & Plan Note (Signed)
Chronic mild renal insufficiency noted, and stable. Likely secondary to HTN. Will continue Losartan. Plan to monitor renal function q3-6 months.

## 2013-10-24 NOTE — Assessment & Plan Note (Signed)
Labs show elevated LDL 190. Recommended starting Atorvastatin 20mg  daily and rechecking lipids in 4 weeks.

## 2013-10-24 NOTE — Patient Instructions (Signed)

## 2013-10-26 ENCOUNTER — Encounter: Payer: Self-pay | Admitting: *Deleted

## 2013-10-31 ENCOUNTER — Telehealth: Payer: Self-pay | Admitting: *Deleted

## 2013-10-31 NOTE — Telephone Encounter (Signed)
Pt states that she does not want to take any statin medications because she has taken before and it made her glucose increase.  Pt wants to know why her kidney function is low and wanted to let you know that she is only covered under Part A or medicare, in case you need to change anything for her last visit.

## 2013-10-31 NOTE — Telephone Encounter (Signed)
She has chronic slight decrease in kidney function which is likely from hypertension, diabetes and obesity. I would recommend that we set up an evaluation with a kidney specialist at some point, with evaluation to include ultrasound of the kidneys.

## 2013-11-01 NOTE — Telephone Encounter (Signed)
Notified pt., Pt states that she does not want to do anything at this time but she will keep this in mind for the future.

## 2013-11-14 ENCOUNTER — Other Ambulatory Visit: Payer: Self-pay | Admitting: Internal Medicine

## 2013-11-27 LAB — HM MAMMOGRAPHY: HM MAMMO: NORMAL (ref 0–4)

## 2013-11-29 ENCOUNTER — Other Ambulatory Visit: Payer: Self-pay | Admitting: Internal Medicine

## 2013-12-06 ENCOUNTER — Ambulatory Visit: Payer: Self-pay | Admitting: Internal Medicine

## 2013-12-12 ENCOUNTER — Other Ambulatory Visit: Payer: Self-pay | Admitting: Internal Medicine

## 2013-12-15 ENCOUNTER — Ambulatory Visit (INDEPENDENT_AMBULATORY_CARE_PROVIDER_SITE_OTHER): Payer: BC Managed Care – PPO

## 2013-12-15 DIAGNOSIS — Z23 Encounter for immunization: Secondary | ICD-10-CM

## 2013-12-18 ENCOUNTER — Other Ambulatory Visit: Payer: Self-pay | Admitting: Internal Medicine

## 2014-01-02 ENCOUNTER — Encounter: Payer: Self-pay | Admitting: Internal Medicine

## 2014-02-16 ENCOUNTER — Other Ambulatory Visit: Payer: Self-pay | Admitting: Internal Medicine

## 2014-03-22 ENCOUNTER — Other Ambulatory Visit: Payer: Self-pay | Admitting: Internal Medicine

## 2014-03-23 NOTE — Telephone Encounter (Signed)
Refill

## 2014-04-30 ENCOUNTER — Other Ambulatory Visit: Payer: Self-pay | Admitting: Internal Medicine

## 2014-05-29 ENCOUNTER — Other Ambulatory Visit: Payer: Self-pay | Admitting: Internal Medicine

## 2014-05-31 ENCOUNTER — Other Ambulatory Visit: Payer: Self-pay | Admitting: Internal Medicine

## 2014-06-26 ENCOUNTER — Other Ambulatory Visit: Payer: Self-pay | Admitting: Internal Medicine

## 2014-08-16 ENCOUNTER — Encounter: Payer: Self-pay | Admitting: Internal Medicine

## 2014-08-16 ENCOUNTER — Other Ambulatory Visit: Payer: Self-pay | Admitting: *Deleted

## 2014-08-16 ENCOUNTER — Ambulatory Visit (INDEPENDENT_AMBULATORY_CARE_PROVIDER_SITE_OTHER): Payer: BLUE CROSS/BLUE SHIELD | Admitting: Internal Medicine

## 2014-08-16 VITALS — BP 117/71 | HR 56 | Temp 97.6°F | Ht 67.5 in | Wt 270.5 lb

## 2014-08-16 DIAGNOSIS — E669 Obesity, unspecified: Secondary | ICD-10-CM

## 2014-08-16 DIAGNOSIS — N183 Chronic kidney disease, stage 3 unspecified: Secondary | ICD-10-CM

## 2014-08-16 DIAGNOSIS — R739 Hyperglycemia, unspecified: Secondary | ICD-10-CM

## 2014-08-16 DIAGNOSIS — I10 Essential (primary) hypertension: Secondary | ICD-10-CM | POA: Diagnosis not present

## 2014-08-16 DIAGNOSIS — E785 Hyperlipidemia, unspecified: Secondary | ICD-10-CM

## 2014-08-16 DIAGNOSIS — R7309 Other abnormal glucose: Secondary | ICD-10-CM | POA: Diagnosis not present

## 2014-08-16 DIAGNOSIS — E7849 Other hyperlipidemia: Secondary | ICD-10-CM

## 2014-08-16 LAB — MICROALBUMIN / CREATININE URINE RATIO
Creatinine,U: 246.4 mg/dL
Microalb Creat Ratio: 0.4 mg/g (ref 0.0–30.0)
Microalb, Ur: 0.9 mg/dL (ref 0.0–1.9)

## 2014-08-16 LAB — COMPREHENSIVE METABOLIC PANEL
ALK PHOS: 80 U/L (ref 39–117)
ALT: 21 U/L (ref 0–35)
AST: 20 U/L (ref 0–37)
Albumin: 3.9 g/dL (ref 3.5–5.2)
BUN: 19 mg/dL (ref 6–23)
CHLORIDE: 105 meq/L (ref 96–112)
CO2: 26 meq/L (ref 19–32)
Calcium: 9 mg/dL (ref 8.4–10.5)
Creatinine, Ser: 1.11 mg/dL (ref 0.40–1.20)
GFR: 52.1 mL/min — ABNORMAL LOW (ref >60.00)
GLUCOSE: 136 mg/dL — AB (ref 70–99)
Potassium: 4.6 mEq/L (ref 3.5–5.1)
SODIUM: 136 meq/L (ref 135–145)
Total Bilirubin: 0.8 mg/dL (ref 0.2–1.2)
Total Protein: 6.6 g/dL (ref 6.0–8.3)

## 2014-08-16 LAB — TSH: TSH: 2.54 u[IU]/mL (ref 0.35–4.50)

## 2014-08-16 LAB — LIPID PANEL
CHOL/HDL RATIO: 6
CHOLESTEROL: 253 mg/dL — AB (ref 0–200)
HDL: 43.9 mg/dL (ref >39.00)
NONHDL: 209.1
TRIGLYCERIDES: 231 mg/dL — AB (ref 0.0–149.0)
VLDL: 46.2 mg/dL — AB (ref 0.0–40.0)

## 2014-08-16 LAB — HEMOGLOBIN A1C: Hgb A1c MFr Bld: 6.1 % (ref 4.6–6.5)

## 2014-08-16 LAB — LDL CHOLESTEROL, DIRECT: LDL DIRECT: 167 mg/dL

## 2014-08-16 LAB — VITAMIN D 25 HYDROXY (VIT D DEFICIENCY, FRACTURES): VITD: 12.59 ng/mL — AB (ref 30.00–100.00)

## 2014-08-16 MED ORDER — LOSARTAN POTASSIUM 100 MG PO TABS: 100.0000 mg | ORAL_TABLET | Freq: Every day | ORAL | Status: DC

## 2014-08-16 MED ORDER — AMLODIPINE BESYLATE 10 MG PO TABS: 10.0000 mg | ORAL_TABLET | Freq: Every day | ORAL | Status: DC

## 2014-08-16 MED ORDER — METOPROLOL SUCCINATE ER 50 MG PO TB24: 50.0000 mg | ORAL_TABLET | Freq: Every day | ORAL | Status: DC

## 2014-08-16 NOTE — Patient Instructions (Signed)
Labs today

## 2014-08-16 NOTE — Progress Notes (Signed)
Pre visit review using our clinic review tool, if applicable. No additional management support is needed unless otherwise documented below in the visit note. 

## 2014-08-16 NOTE — Progress Notes (Signed)
Subjective:    Patient ID: Savannah Hood, female    DOB: 1945-09-19, 69 y.o.   MRN: [id number]  HPI 69YO female presents for follow up.  Feeling well. Compliant with medications. No concerns today. No recent chest pain, palpitations.  Wt Readings from Last 3 Encounters:  08/16/14 270 lb 8 oz (122.698 kg)  10/24/13 275 lb 4 oz (124.853 kg)  03/11/13 272 lb (123.378 kg)     Past medical, surgical, family and social history per today's encounter.  Review of Systems  Constitutional: Negative for fever, chills, appetite change, fatigue and unexpected weight change.  Eyes: Negative for visual disturbance.  Respiratory: Negative for shortness of breath.   Cardiovascular: Negative for chest pain and leg swelling.  Gastrointestinal: Negative for nausea, vomiting, abdominal pain, diarrhea and constipation.  Musculoskeletal: Negative for myalgias and arthralgias.  Skin: Negative for color change and rash.  Hematological: Negative for adenopathy. Does not bruise/bleed easily.  Psychiatric/Behavioral: Negative for dysphoric mood. The patient is not nervous/anxious.        Objective:    BP 117/71 mmHg  Pulse 56  Temp(Src) 97.6 F (36.4 C) (Oral)  Ht 5' 7.5" (1.715 m)  Wt 270 lb 8 oz (122.698 kg)  BMI 41.72 kg/m2  SpO2 96% Physical Exam  Constitutional: She is oriented to person, place, and time. She appears well-developed and well-nourished. No distress.  HENT:  Head: Normocephalic and atraumatic.  Right Ear: External ear normal.  Left Ear: External ear normal.  Nose: Nose normal.  Mouth/Throat: Oropharynx is clear and moist. No oropharyngeal exudate.  Eyes: Conjunctivae are normal. Pupils are equal, round, and reactive to light. Right eye exhibits no discharge. Left eye exhibits no discharge. No scleral icterus.  Neck: Normal range of motion. Neck supple. No tracheal deviation present. No thyromegaly present.  Cardiovascular: Normal rate, regular rhythm, normal heart  sounds and intact distal pulses.  Exam reveals no gallop and no friction rub.   No murmur heard. Pulmonary/Chest: Effort normal and breath sounds normal. No respiratory distress. She has no wheezes. She has no rales. She exhibits no tenderness.  Musculoskeletal: Normal range of motion. She exhibits no edema or tenderness.  Lymphadenopathy:    She has no cervical adenopathy.  Neurological: She is alert and oriented to person, place, and time. No cranial nerve deficit. She exhibits normal muscle tone. Coordination normal.  Skin: Skin is warm and dry. No rash noted. She is not diaphoretic. No erythema. No pallor.  Psychiatric: She has a normal mood and affect. Her behavior is normal. Judgment and thought content normal.          Assessment & Plan:   Problem List Items Addressed This Visit      Unprioritized   CKD (chronic kidney disease) stage 3, GFR 30-59 ml/min    Renal function with labs today.      Relevant Orders   Vit D  25 hydroxy (rtn osteoporosis monitoring)   Microalbumin / creatinine urine ratio   Elevated blood sugar    Will check A1c with labs.      Relevant Orders   Hemoglobin A1c   TSH   Familial hyperlipidemia, high LDL    Will check lipids and LFTs with labs.      Relevant Medications   metoprolol succinate (TOPROL-XL) 50 MG 24 hr tablet   losartan (COZAAR) 100 MG tablet   amLODipine (NORVASC) 10 MG tablet   Other Relevant Orders   Lipid panel   Hypertension - Primary  BP Readings from Last 3 Encounters:  08/16/14 117/71  10/24/13 124/72  03/11/13 110/70   BP well controlled. Continue current medications. Renal function with labs.      Relevant Medications   metoprolol succinate (TOPROL-XL) 50 MG 24 hr tablet   losartan (COZAAR) 100 MG tablet   amLODipine (NORVASC) 10 MG tablet   Other Relevant Orders   Comprehensive metabolic panel   Obesity    Wt Readings from Last 3 Encounters:  08/16/14 270 lb 8 oz (122.698 kg)  10/24/13 275 lb 4 oz  (124.853 kg)  03/11/13 272 lb (123.378 kg)   Body mass index is 41.72 kg/(m^2). The patient is asked to make an attempt to improve diet and exercise patterns to aid in medical management of this problem.           Return in about 6 months (around 02/16/2015) for Wellness Visit.

## 2014-08-16 NOTE — Assessment & Plan Note (Signed)
Wt Readings from Last 3 Encounters:  08/16/14 270 lb 8 oz (122.698 kg)  10/24/13 275 lb 4 oz (124.853 kg)  03/11/13 272 lb (123.378 kg)   Body mass index is 41.72 kg/(m^2). The patient is asked to make an attempt to improve diet and exercise patterns to aid in medical management of this problem.

## 2014-08-16 NOTE — Assessment & Plan Note (Signed)
Will check A1c with labs.

## 2014-08-16 NOTE — Assessment & Plan Note (Signed)
Will check lipids and LFTs with labs. 

## 2014-08-16 NOTE — Assessment & Plan Note (Signed)
Renal function with labs today.  

## 2014-08-16 NOTE — Assessment & Plan Note (Signed)
BP Readings from Last 3 Encounters:  08/16/14 117/71  10/24/13 124/72  03/11/13 110/70   BP well controlled. Continue current medications. Renal function with labs.

## 2014-08-23 ENCOUNTER — Other Ambulatory Visit: Payer: Self-pay | Admitting: Internal Medicine

## 2014-08-23 NOTE — Telephone Encounter (Signed)
Last OV 5.25.16, last refill 5.13.16.  Please advise refill

## 2014-09-22 ENCOUNTER — Other Ambulatory Visit: Payer: Self-pay | Admitting: Internal Medicine

## 2014-11-10 ENCOUNTER — Other Ambulatory Visit: Payer: Self-pay | Admitting: Internal Medicine

## 2014-11-13 NOTE — Telephone Encounter (Signed)
Last OV 5.25.16, last refill 7.27.16.  Please advise refill

## 2015-02-05 ENCOUNTER — Other Ambulatory Visit: Payer: Self-pay | Admitting: Internal Medicine

## 2015-04-19 ENCOUNTER — Other Ambulatory Visit: Payer: Self-pay | Admitting: Internal Medicine

## 2015-06-24 ENCOUNTER — Other Ambulatory Visit: Payer: Self-pay | Admitting: Internal Medicine

## 2015-07-26 ENCOUNTER — Other Ambulatory Visit: Payer: Self-pay | Admitting: Internal Medicine

## 2015-08-03 ENCOUNTER — Encounter: Payer: BLUE CROSS/BLUE SHIELD | Admitting: Internal Medicine

## 2015-08-06 DIAGNOSIS — H40002 Preglaucoma, unspecified, left eye: Secondary | ICD-10-CM | POA: Diagnosis not present

## 2015-08-14 DIAGNOSIS — H40002 Preglaucoma, unspecified, left eye: Secondary | ICD-10-CM | POA: Diagnosis not present

## 2015-08-23 ENCOUNTER — Other Ambulatory Visit: Payer: Self-pay | Admitting: Internal Medicine

## 2015-08-28 ENCOUNTER — Encounter: Payer: Self-pay | Admitting: Internal Medicine

## 2015-08-28 ENCOUNTER — Ambulatory Visit (INDEPENDENT_AMBULATORY_CARE_PROVIDER_SITE_OTHER): Payer: BLUE CROSS/BLUE SHIELD | Admitting: Internal Medicine

## 2015-08-28 VITALS — BP 136/78 | HR 62 | Ht 68.0 in | Wt 269.2 lb

## 2015-08-28 DIAGNOSIS — Z0000 Encounter for general adult medical examination without abnormal findings: Secondary | ICD-10-CM | POA: Diagnosis not present

## 2015-08-28 DIAGNOSIS — Z23 Encounter for immunization: Secondary | ICD-10-CM | POA: Diagnosis not present

## 2015-08-28 DIAGNOSIS — Z0001 Encounter for general adult medical examination with abnormal findings: Secondary | ICD-10-CM | POA: Insufficient documentation

## 2015-08-28 LAB — CBC WITH DIFFERENTIAL/PLATELET
BASOS ABS: 0 10*3/uL (ref 0.0–0.1)
Basophils Relative: 0.6 % (ref 0.0–3.0)
Eosinophils Absolute: 0.2 10*3/uL (ref 0.0–0.7)
Eosinophils Relative: 2.2 % (ref 0.0–5.0)
HCT: 40.8 % (ref 36.0–46.0)
HEMOGLOBIN: 14 g/dL (ref 12.0–15.0)
LYMPHS ABS: 1.2 10*3/uL (ref 0.7–4.0)
Lymphocytes Relative: 15.1 % (ref 12.0–46.0)
MCHC: 34.2 g/dL (ref 30.0–36.0)
MCV: 91.8 fl (ref 78.0–100.0)
MONO ABS: 0.5 10*3/uL (ref 0.1–1.0)
Monocytes Relative: 6.4 % (ref 3.0–12.0)
NEUTROS PCT: 75.7 % (ref 43.0–77.0)
Neutro Abs: 5.9 10*3/uL (ref 1.4–7.7)
Platelets: 206 10*3/uL (ref 150.0–400.0)
RBC: 4.45 Mil/uL (ref 3.87–5.11)
RDW: 13.4 % (ref 11.5–15.5)
WBC: 7.8 10*3/uL (ref 4.0–10.5)

## 2015-08-28 LAB — COMPREHENSIVE METABOLIC PANEL
ALBUMIN: 3.9 g/dL (ref 3.5–5.2)
ALK PHOS: 79 U/L (ref 39–117)
ALT: 23 U/L (ref 0–35)
AST: 22 U/L (ref 0–37)
BILIRUBIN TOTAL: 0.6 mg/dL (ref 0.2–1.2)
BUN: 22 mg/dL (ref 6–23)
CALCIUM: 9.1 mg/dL (ref 8.4–10.5)
CHLORIDE: 103 meq/L (ref 96–112)
CO2: 27 mEq/L (ref 19–32)
CREATININE: 1.11 mg/dL (ref 0.40–1.20)
GFR: 51.94 mL/min — ABNORMAL LOW (ref >60.00)
Glucose, Bld: 131 mg/dL — ABNORMAL HIGH (ref 70–99)
Potassium: 4.5 mEq/L (ref 3.5–5.1)
SODIUM: 136 meq/L (ref 135–145)
TOTAL PROTEIN: 6.5 g/dL (ref 6.0–8.3)

## 2015-08-28 LAB — LIPID PANEL
CHOLESTEROL: 231 mg/dL — AB (ref 0–200)
HDL: 44.6 mg/dL (ref >39.00)
LDL Cholesterol: 148 mg/dL — ABNORMAL HIGH (ref 0–99)
NonHDL: 186.72
TRIGLYCERIDES: 193 mg/dL — AB (ref 0.0–149.0)
Total CHOL/HDL Ratio: 5
VLDL: 38.6 mg/dL (ref 0.0–40.0)

## 2015-08-28 LAB — MICROALBUMIN / CREATININE URINE RATIO
Creatinine,U: 159.2 mg/dL
MICROALB/CREAT RATIO: 0.4 mg/g (ref 0.0–30.0)
Microalb, Ur: <0.7 mg/dL (ref 0.0–1.9)

## 2015-08-28 LAB — TSH: TSH: 2.37 u[IU]/mL (ref 0.35–4.50)

## 2015-08-28 LAB — HEMOGLOBIN A1C: Hgb A1c MFr Bld: 5.9 % (ref 4.6–6.5)

## 2015-08-28 LAB — VITAMIN D 25 HYDROXY (VIT D DEFICIENCY, FRACTURES): VITD: 19.26 ng/mL — ABNORMAL LOW (ref 30.00–100.00)

## 2015-08-28 MED ORDER — AMLODIPINE BESYLATE 10 MG PO TABS: 10.0000 mg | ORAL_TABLET | Freq: Every day | ORAL | Status: DC

## 2015-08-28 MED ORDER — LOSARTAN POTASSIUM 100 MG PO TABS: 100.0000 mg | ORAL_TABLET | Freq: Every day | ORAL | Status: DC

## 2015-08-28 MED ORDER — METOPROLOL SUCCINATE ER 50 MG PO TB24: 50.0000 mg | ORAL_TABLET | Freq: Every day | ORAL | Status: DC

## 2015-08-28 NOTE — Patient Instructions (Signed)
Health Maintenance, Female Adopting a healthy lifestyle and getting preventive care can go a long way to promote health and wellness. Talk with your health care provider about what schedule of regular examinations is right for you. This is a good chance for you to check in with your provider about disease prevention and staying healthy. In between checkups, there are plenty of things you can do on your own. Experts have done a lot of research about which lifestyle changes and preventive measures are most likely to keep you healthy. Ask your health care provider for more information. WEIGHT AND DIET  Eat a healthy diet  Be sure to include plenty of vegetables, fruits, low-fat dairy products, and lean protein.  Do not eat a lot of foods high in solid fats, added sugars, or salt.  Get regular exercise. This is one of the most important things you can do for your health.  Most adults should exercise for at least 150 minutes each week. The exercise should increase your heart rate and make you sweat (moderate-intensity exercise).  Most adults should also do strengthening exercises at least twice a week. This is in addition to the moderate-intensity exercise.  Maintain a healthy weight  Body mass index (BMI) is a measurement that can be used to identify possible weight problems. It estimates body fat based on height and weight. Your health care provider can help determine your BMI and help you achieve or maintain a healthy weight.  For females 20 years of age and older:   A BMI below 18.5 is considered underweight.  A BMI of 18.5 to 24.9 is normal.  A BMI of 25 to 29.9 is considered overweight.  A BMI of 30 and above is considered obese.  Watch levels of cholesterol and blood lipids  You should start having your blood tested for lipids and cholesterol at 68 years of age, then have this test every 5 years.  You may need to have your cholesterol levels checked more often if:  Your lipid  or cholesterol levels are high.  You are older than 68 years of age.  You are at high risk for heart disease.  CANCER SCREENING   Lung Cancer  Lung cancer screening is recommended for adults 55-80 years old who are at high risk for lung cancer because of a history of smoking.  A yearly low-dose CT scan of the lungs is recommended for people who:  Currently smoke.  Have quit within the past 15 years.  Have at least a 30-pack-year history of smoking. A pack year is smoking an average of one pack of cigarettes a day for 1 year.  Yearly screening should continue until it has been 15 years since you quit.  Yearly screening should stop if you develop a health problem that would prevent you from having lung cancer treatment.  Breast Cancer  Practice breast self-awareness. This means understanding how your breasts normally appear and feel.  It also means doing regular breast self-exams. Let your health care provider know about any changes, no matter how small.  If you are in your 20s or 30s, you should have a clinical breast exam (CBE) by a health care provider every 1-3 years as part of a regular health exam.  If you are 40 or older, have a CBE every year. Also consider having a breast X-ray (mammogram) every year.  If you have a family history of breast cancer, talk to your health care provider about genetic screening.  If you   are at high risk for breast cancer, talk to your health care provider about having an MRI and a mammogram every year.  Breast cancer gene (BRCA) assessment is recommended for women who have family members with BRCA-related cancers. BRCA-related cancers include:  Breast.  Ovarian.  Tubal.  Peritoneal cancers.  Results of the assessment will determine the need for genetic counseling and BRCA1 and BRCA2 testing. Cervical Cancer Your health care provider may recommend that you be screened regularly for cancer of the pelvic organs (ovaries, uterus, and  vagina). This screening involves a pelvic examination, including checking for microscopic changes to the surface of your cervix (Pap test). You may be encouraged to have this screening done every 3 years, beginning at age 21.  For women ages 30-65, health care providers may recommend pelvic exams and Pap testing every 3 years, or they may recommend the Pap and pelvic exam, combined with testing for human papilloma virus (HPV), every 5 years. Some types of HPV increase your risk of cervical cancer. Testing for HPV may also be done on women of any age with unclear Pap test results.  Other health care providers may not recommend any screening for nonpregnant women who are considered low risk for pelvic cancer and who do not have symptoms. Ask your health care provider if a screening pelvic exam is right for you.  If you have had past treatment for cervical cancer or a condition that could lead to cancer, you need Pap tests and screening for cancer for at least 20 years after your treatment. If Pap tests have been discontinued, your risk factors (such as having a new sexual partner) need to be reassessed to determine if screening should resume. Some women have medical problems that increase the chance of getting cervical cancer. In these cases, your health care provider may recommend more frequent screening and Pap tests. Colorectal Cancer  This type of cancer can be detected and often prevented.  Routine colorectal cancer screening usually begins at 68 years of age and continues through 68 years of age.  Your health care provider may recommend screening at an earlier age if you have risk factors for colon cancer.  Your health care provider may also recommend using home test kits to check for hidden blood in the stool.  A small camera at the end of a tube can be used to examine your colon directly (sigmoidoscopy or colonoscopy). This is done to check for the earliest forms of colorectal  cancer.  Routine screening usually begins at age 50.  Direct examination of the colon should be repeated every 5-10 years through 68 years of age. However, you may need to be screened more often if early forms of precancerous polyps or small growths are found. Skin Cancer  Check your skin from head to toe regularly.  Tell your health care provider about any new moles or changes in moles, especially if there is a change in a mole's shape or color.  Also tell your health care provider if you have a mole that is larger than the size of a pencil eraser.  Always use sunscreen. Apply sunscreen liberally and repeatedly throughout the day.  Protect yourself by wearing long sleeves, pants, a wide-brimmed hat, and sunglasses whenever you are outside. HEART DISEASE, DIABETES, AND HIGH BLOOD PRESSURE   High blood pressure causes heart disease and increases the risk of stroke. High blood pressure is more likely to develop in:  People who have blood pressure in the high end   of the normal range (130-139/85-89 mm Hg).  People who are overweight or obese.  People who are African American.  If you are 38-23 years of age, have your blood pressure checked every 3-5 years. If you are 61 years of age or older, have your blood pressure checked every year. You should have your blood pressure measured twice--once when you are at a hospital or clinic, and once when you are not at a hospital or clinic. Record the average of the two measurements. To check your blood pressure when you are not at a hospital or clinic, you can use:  An automated blood pressure machine at a pharmacy.  A home blood pressure monitor.  If you are between 45 years and 39 years old, ask your health care provider if you should take aspirin to prevent strokes.  Have regular diabetes screenings. This involves taking a blood sample to check your fasting blood sugar level.  If you are at a normal weight and have a low risk for diabetes,  have this test once every three years after 68 years of age.  If you are overweight and have a high risk for diabetes, consider being tested at a younger age or more often. PREVENTING INFECTION  Hepatitis B  If you have a higher risk for hepatitis B, you should be screened for this virus. You are considered at high risk for hepatitis B if:  You were born in a country where hepatitis B is common. Ask your health care provider which countries are considered high risk.  Your parents were born in a high-risk country, and you have not been immunized against hepatitis B (hepatitis B vaccine).  You have HIV or AIDS.  You use needles to inject street drugs.  You live with someone who has hepatitis B.  You have had sex with someone who has hepatitis B.  You get hemodialysis treatment.  You take certain medicines for conditions, including cancer, organ transplantation, and autoimmune conditions. Hepatitis C  Blood testing is recommended for:  Everyone born from [birth year] through 1963.  Anyone with known risk factors for hepatitis C. Sexually transmitted infections (STIs)  You should be screened for sexually transmitted infections (STIs) including gonorrhea and chlamydia if:  You are sexually active and are younger than 68 years of age.  You are older than 68 years of age and your health care provider tells you that you are at risk for this type of infection.  Your sexual activity has changed since you were last screened and you are at an increased risk for chlamydia or gonorrhea. Ask your health care provider if you are at risk.  If you do not have HIV, but are at risk, it may be recommended that you take a prescription medicine daily to prevent HIV infection. This is called pre-exposure prophylaxis (PrEP). You are considered at risk if:  You are sexually active and do not regularly use condoms or know the HIV status of your partner(s).  You take drugs by injection.  You are sexually  active with a partner who has HIV. Talk with your health care provider about whether you are at high risk of being infected with HIV. If you choose to begin PrEP, you should first be tested for HIV. You should then be tested every 3 months for as long as you are taking PrEP.  PREGNANCY   If you are premenopausal and you may become pregnant, ask your health care provider about preconception counseling.  If you may  become pregnant, take 400 to 800 micrograms (mcg) of folic acid every day.  If you want to prevent pregnancy, talk to your health care provider about birth control (contraception). OSTEOPOROSIS AND MENOPAUSE   Osteoporosis is a disease in which the bones lose minerals and strength with aging. This can result in serious bone fractures. Your risk for osteoporosis can be identified using a bone density scan.  If you are 61 years of age or older, or if you are at risk for osteoporosis and fractures, ask your health care provider if you should be screened.  Ask your health care provider whether you should take a calcium or vitamin D supplement to lower your risk for osteoporosis.  Menopause may have certain physical symptoms and risks.  Hormone replacement therapy may reduce some of these symptoms and risks. Talk to your health care provider about whether hormone replacement therapy is right for you.  HOME CARE INSTRUCTIONS   Schedule regular health, dental, and eye exams.  Stay current with your immunizations.   Do not use any tobacco products including cigarettes, chewing tobacco, or electronic cigarettes.  If you are pregnant, do not drink alcohol.  If you are breastfeeding, limit how much and how often you drink alcohol.  Limit alcohol intake to no more than 1 drink per day for nonpregnant women. One drink equals 12 ounces of beer, 5 ounces of wine, or 1 ounces of hard liquor.  Do not use street drugs.  Do not share needles.  Ask your health care provider for help if  you need support or information about quitting drugs.  Tell your health care provider if you often feel depressed.  Tell your health care provider if you have ever been abused or do not feel safe at home.   This information is not intended to replace advice given to you by your health care provider. Make sure you discuss any questions you have with your health care provider.   Document Released: 09/23/2010 Document Revised: 03/31/2014 Document Reviewed: 02/09/2013 Elsevier Interactive Patient Education [employer].

## 2015-08-28 NOTE — Addendum Note (Signed)
Addended by: Leeanne Rio on: 08/28/2015 09:27 AM   Modules accepted: Miquel Dunn

## 2015-08-28 NOTE — Addendum Note (Signed)
Addended by: Elpidio Galea T on: 08/28/2015 09:24 AM   Modules accepted: Orders, SmartSet

## 2015-08-28 NOTE — Assessment & Plan Note (Signed)
General medical exam normal today including breast exam. Mammogram ordered for fall of 2017. Colonoscopy UTD. Prevnar given today. Labs as ordered. Encouraged healthy diet and exercise. Follow up in 6 months and prn.

## 2015-08-28 NOTE — Progress Notes (Signed)
Subjective:    Patient ID: Savannah Hood, female    DOB: [birth year], 70 y.o.   MRN: [id number]  HPI  70YO female presents for physical exam.  Feeling well. No concerns today.  Wt Readings from Last 3 Encounters:  08/28/15 269 lb 3.2 oz (122.108 kg)  08/16/14 270 lb 8 oz (122.698 kg)  10/24/13 275 lb 4 oz (124.853 kg)   BP Readings from Last 3 Encounters:  08/28/15 136/78  08/16/14 117/71  10/24/13 124/72    Past Medical History  Diagnosis Date  . Obesity   . Diabetes mellitus     pre-diabetes in past  . Arthritis   . Colon polyps   . Hypertension   . Hyperlipidemia   . Glaucoma    Family History  Problem Relation Age of Onset  . Arthritis Mother   . Arthritis Sister   . Cancer Father     prostate  . Diabetes Father   . Diabetes Paternal Grandmother    Past Surgical History  Procedure Laterality Date  . Abdominal hysterectomy      total, secondary to uterine polyps, Dr. Rayford Halsted   Social History   Social History  . Marital Status: Married    Spouse Name: N/A  . Number of Children: N/A  . Years of Education: N/A   Social History Main Topics  . Smoking status: Never Smoker   . Smokeless tobacco: Never Used  . Alcohol Use: Yes     Comment: rarely  . Drug Use: No  . Sexual Activity: Not Asked   Other Topics Concern  . None   Social History Narrative   Lives in [place] with husband, has 2 children. 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 Pets - 2 dogs.     Review of Systems  Constitutional: Negative for fever, chills, appetite change, fatigue and unexpected weight change.  Eyes: Negative for visual disturbance.  Respiratory: Negative for cough and shortness of breath.   Cardiovascular: Negative for chest pain, palpitations and leg swelling.  Gastrointestinal: Negative for nausea, vomiting, abdominal pain, diarrhea and constipation.  Musculoskeletal: Negative for myalgias and arthralgias.  Skin: Negative for color change and rash.  Hematological:  Negative for adenopathy. Does not bruise/bleed easily.  Psychiatric/Behavioral: Negative for suicidal ideas, sleep disturbance and dysphoric mood. The patient is not nervous/anxious.        Objective:    BP 136/78 mmHg  Pulse 62  Ht 5\' 8"  (1.727 m)  Wt 269 lb 3.2 oz (122.108 kg)  BMI 40.94 kg/m2  SpO2 98% Physical Exam  Constitutional: She is oriented to person, place, and time. She appears well-developed and well-nourished. No distress.  HENT:  Head: Normocephalic and atraumatic.  Right Ear: External ear normal.  Left Ear: External ear normal.  Nose: Nose normal.  Mouth/Throat: Oropharynx is clear and moist. No oropharyngeal exudate.  Eyes: Conjunctivae are normal. Pupils are equal, round, and reactive to light. Right eye exhibits no discharge. Left eye exhibits no discharge. No scleral icterus.  Neck: Normal range of motion. Neck supple. No tracheal deviation present. No thyromegaly present.  Cardiovascular: Normal rate, regular rhythm, normal heart sounds and intact distal pulses.  Exam reveals no gallop and no friction rub.   No murmur heard. Pulmonary/Chest: Effort normal and breath sounds normal. No accessory muscle usage. No tachypnea. No respiratory distress. She has no decreased breath sounds. She has no wheezes. She has no rales. She exhibits no tenderness. Right breast exhibits no inverted nipple, no mass, no nipple discharge, no skin  change and no tenderness. Left breast exhibits no inverted nipple, no mass, no nipple discharge, no skin change and no tenderness. Breasts are symmetrical.  Abdominal: Soft. Bowel sounds are normal. She exhibits no distension and no mass. There is no tenderness. There is no rebound and no guarding.  Musculoskeletal: Normal range of motion. She exhibits no edema or tenderness.  Lymphadenopathy:    She has no cervical adenopathy.  Neurological: She is alert and oriented to person, place, and time. No cranial nerve deficit. She exhibits normal  muscle tone. Coordination normal.  Skin: Skin is warm and dry. No rash noted. She is not diaphoretic. No erythema. No pallor.  Psychiatric: She has a normal mood and affect. Her behavior is normal. Judgment and thought content normal.          Assessment & Plan:   Problem List Items Addressed This Visit      Unprioritized   Routine general medical examination at a health care facility - Primary    General medical exam normal today including breast exam. Mammogram ordered for fall of 2017. Colonoscopy UTD. Prevnar given today. Labs as ordered. Encouraged healthy diet and exercise. Follow up in 6 months and prn.      Relevant Orders   MM Digital Screening   Hemoglobin A1c   CBC with Differential/Platelet   Comprehensive metabolic panel   Lipid panel   VITAMIN D 25 Hydroxy (Vit-D Deficiency, Fractures)   TSH   Microalbumin / creatinine urine ratio       Return in about 6 months (around 02/27/2016) for Recheck.  Ronette Deter, MD [facility]

## 2015-08-28 NOTE — Progress Notes (Signed)
Pre visit review using our clinic review tool, if applicable. No additional management support is needed unless otherwise documented below in the visit note. 

## 2015-09-02 ENCOUNTER — Other Ambulatory Visit: Payer: Self-pay | Admitting: Internal Medicine

## 2015-10-01 ENCOUNTER — Other Ambulatory Visit: Payer: Self-pay | Admitting: Internal Medicine

## 2015-10-01 MED ORDER — NYSTATIN-TRIAMCINOLONE 100000-0.1 UNIT/GM-% EX CREA: TOPICAL_CREAM | CUTANEOUS | Status: DC

## 2015-11-07 ENCOUNTER — Ambulatory Visit: Payer: BLUE CROSS/BLUE SHIELD

## 2015-11-13 ENCOUNTER — Encounter: Payer: Self-pay | Admitting: Family

## 2015-11-13 ENCOUNTER — Ambulatory Visit (INDEPENDENT_AMBULATORY_CARE_PROVIDER_SITE_OTHER): Payer: BLUE CROSS/BLUE SHIELD | Admitting: Family

## 2015-11-13 VITALS — BP 128/78 | HR 61 | Temp 97.6°F | Wt 265.8 lb

## 2015-11-13 DIAGNOSIS — H9203 Otalgia, bilateral: Secondary | ICD-10-CM

## 2015-11-13 MED ORDER — CEFDINIR 300 MG PO CAPS: 300.0000 mg | ORAL_CAPSULE | Freq: Two times a day (BID) | ORAL | 0 refills | Status: AC

## 2015-11-13 MED ORDER — OFLOXACIN 0.3 % OT SOLN: OTIC | 0 refills | Status: DC

## 2015-11-13 NOTE — Progress Notes (Signed)
Subjective:    Patient ID: Savannah Hood, female    DOB: [birth year], 70 y.o.   MRN: [id number]  CC: Savannah Hood is a 70 y.o. female who presents today for an acute visit.    HPI: Here for evaluation of bilateral ear pain 3 weeks ago, improving. Recently traveled to [place] and treated with amoxicillin and flonase without resolve. Notes congestion prior to flight in which during flight at a lot of pain from pressure as well impaired hearing lingering after flight. Hearing is improving. Ear pain is 'largely gone' , mostly pressure. Endorses congestion, mild cough. No sinus pain, fever, chills. She also states she hears background music all the time. No tinnitus.        HISTORY:  Past Medical History:  Diagnosis Date  . Arthritis   . Colon polyps   . Diabetes mellitus    pre-diabetes in past  . Glaucoma   . Hyperlipidemia   . Hypertension   . Obesity    Past Surgical History:  Procedure Laterality Date  . ABDOMINAL HYSTERECTOMY     total, secondary to uterine polyps, Dr. Rayford Halsted   Family History  Problem Relation Age of Onset  . Arthritis Mother   . Arthritis Sister   . Cancer Father     prostate  . Diabetes Father   . Diabetes Paternal Grandmother     Allergies: Review of patient's allergies indicates no known allergies. Current Outpatient Prescriptions on File Prior to Visit  Medication Sig Dispense Refill  . amLODipine (NORVASC) 10 MG tablet Take 1 tablet (10 mg total) by mouth daily. 90 tablet 3  . aspirin 81 MG tablet Take 81 mg by mouth daily.      Marland Kitchen KRILL OIL PO Take 1 capsule by mouth daily.      Marland Kitchen latanoprost (XALATAN) 0.005 % ophthalmic solution Place 1 drop into both eyes daily.      Marland Kitchen losartan (COZAAR) 100 MG tablet Take 1 tablet (100 mg total) by mouth daily. 90 tablet 3  . LUTEIN PO Take 1 tablet by mouth daily.      . metoprolol succinate (TOPROL-XL) 50 MG 24 hr tablet Take 1 tablet (50 mg total) by mouth daily. Take with or immediately  following a meal. 90 tablet 3  . Multiple Vitamins-Minerals (CENTRUM SILVER ULTRA WOMENS PO) Take by mouth.    . nystatin (MYCOSTATIN/NYSTOP) 100000 UNIT/GM POWD APPLY TO AFFECTED AREA 4 TIMES A DAY 120 g 2  . nystatin-triamcinolone (MYCOLOG II) cream APPLY TO AFFECTED AREA TWICE A DAY 180 g 3  . vitamin B-12 (CYANOCOBALAMIN) 1000 MCG tablet Take 1,000 mcg by mouth daily.     No current facility-administered medications on file prior to visit.     Social History  Substance Use Topics  . Smoking status: Never Smoker  . Smokeless tobacco: Never Used  . Alcohol use Yes     Comment: rarely    Review of Systems  Constitutional: Negative for chills and fever.  HENT: Positive for congestion and hearing loss. Negative for sinus pressure, sore throat, tinnitus and trouble swallowing.   Respiratory: Positive for cough. Negative for shortness of breath and wheezing.   Cardiovascular: Negative for chest pain and palpitations.  Gastrointestinal: Negative for nausea and vomiting.  Neurological: Negative for dizziness and headaches.      Objective:    BP 128/78   Pulse 61   Temp 97.6 F (36.4 C) (Oral)   Wt 265 lb 12.8 oz (120.6 kg)  SpO2 97%   BMI 40.41 kg/m    Physical Exam  Constitutional: She appears well-developed and well-nourished.  HENT:  Head: Normocephalic and atraumatic.  Right Ear: Hearing, tympanic membrane, external ear and ear canal normal. No drainage, swelling or tenderness. No foreign bodies. Tympanic membrane is not erythematous and not bulging. No middle ear effusion. No decreased hearing is noted.  Left Ear: Hearing, tympanic membrane, external ear and ear canal normal. No drainage, swelling or tenderness. No foreign bodies. Tympanic membrane is not erythematous and not bulging.  No middle ear effusion. No decreased hearing is noted.  Nose: Nose normal. No rhinorrhea. Right sinus exhibits no maxillary sinus tenderness and no frontal sinus tenderness. Left sinus  exhibits no maxillary sinus tenderness and no frontal sinus tenderness.  Mouth/Throat: Uvula is midline, oropharynx is clear and moist and mucous membranes are normal. No oropharyngeal exudate, posterior oropharyngeal edema, posterior oropharyngeal erythema or tonsillar abscesses.  Tympanic membranes appear intact.   Eyes: Conjunctivae are normal.  Cardiovascular: Regular rhythm, normal heart sounds and normal pulses.   Pulmonary/Chest: Effort normal and breath sounds normal. She has no wheezes. She has no rhonchi. She has no rales.  Lymphadenopathy:       Head (right side): No submental, no submandibular, no tonsillar, no preauricular, no posterior auricular and no occipital adenopathy present.       Head (left side): No submental, no submandibular, no tonsillar, no preauricular, no posterior auricular and no occipital adenopathy present.    She has no cervical adenopathy.  Neurological: She is alert.  Skin: Skin is warm and dry.  Psychiatric: She has a normal mood and affect. Her speech is normal and behavior is normal. Thought content normal.  Vitals reviewed.      Assessment & Plan:   1. Ear pain, bilateral I have concern for barotrauma due to patient's recent airplane travel and complaining of bilateral ear pain. I was unable to appreciate any perforation of eardrums however have concern for this as patient is hearing music and has impaired hearing at this time. I'm reassured that the ear pain has largely resolved and her hearing has improved. Nonetheless, I placed an urgent referral to ENT and to see if we can get her in sooner than the appointment the patient made for early September. Will closely follow.   - Ambulatory referral to ENT - ofloxacin (FLOXIN) 0.3 % otic solution; 10 gtts daily in affected ear(s) for 7 days.  Dispense: 4 mL; Refill: 0 - cefdinir (OMNICEF) 300 MG capsule; Take 1 capsule (300 mg total) by mouth 2 (two) times daily.  Dispense: 20 capsule; Refill: 0    I  am having Ms. Iyengar start on ofloxacin and cefdinir. I am also having her maintain her latanoprost, LUTEIN PO, KRILL OIL PO, aspirin, vitamin B-12, Multiple Vitamins-Minerals (CENTRUM SILVER ULTRA WOMENS PO), nystatin, metoprolol succinate, losartan, amLODipine, and nystatin-triamcinolone.   Meds ordered this encounter  Medications  . ofloxacin (FLOXIN) 0.3 % otic solution    Sig: 10 gtts daily in affected ear(s) for 7 days.    Dispense:  4 mL    Refill:  0    Order Specific Question:   Supervising Provider    Answer:   Derrel Nip, TERESA L [[id number]]  . cefdinir (OMNICEF) 300 MG capsule    Sig: Take 1 capsule (300 mg total) by mouth 2 (two) times daily.    Dispense:  20 capsule    Refill:  0    Order Specific Question:  Supervising Provider    Answer:   Crecencio Mc [[id number]]    Return precautions given.   Risks, benefits, and alternatives of the medications and treatment plan prescribed today were discussed, and patient expressed understanding.   Education regarding symptom management and diagnosis given to patient on AVS.  Continue to follow with Rica Mast, MD for routine health maintenance.   Lorin Picket and I agreed with plan.   Mable Paris, FNP

## 2015-11-13 NOTE — Patient Instructions (Signed)
Let's treat one more time with different antibiotics including oral and ear drops. I will speak with referral coordinator and see if we cant get you to ENT sooner.    Otitis Media, Adult Otitis media is redness, soreness, and inflammation of the middle ear. Otitis media may be caused by allergies or, most commonly, by infection. Often it occurs as a complication of the common cold. SIGNS AND SYMPTOMS Symptoms of otitis media may include:  Earache.  Fever.  Ringing in your ear.  Headache.  Leakage of fluid from the ear. DIAGNOSIS To diagnose otitis media, your health care provider will examine your ear with an otoscope. This is an instrument that allows your health care provider to see into your ear in order to examine your eardrum. Your health care provider also will ask you questions about your symptoms. TREATMENT  Typically, otitis media resolves on its own within 3-5 days. Your health care provider may prescribe medicine to ease your symptoms of pain. If otitis media does not resolve within 5 days or is recurrent, your health care provider may prescribe antibiotic medicines if he or she suspects that a bacterial infection is the cause. HOME CARE INSTRUCTIONS   If you were prescribed an antibiotic medicine, finish it all even if you start to feel better.  Take medicines only as directed by your health care provider.  Keep all follow-up visits as directed by your health care provider. SEEK MEDICAL CARE IF:  You have otitis media only in one ear, or bleeding from your nose, or both.  You notice a lump on your neck.  You are not getting better in 3-5 days.  You feel worse instead of better. SEEK IMMEDIATE MEDICAL CARE IF:   You have pain that is not controlled with medicine.  You have swelling, redness, or pain around your ear or stiffness in your neck.  You notice that part of your face is paralyzed.  You notice that the bone behind your ear (mastoid) is tender when you  touch it. MAKE SURE YOU:   Understand these instructions.  Will watch your condition.  Will get help right away if you are not doing well or get worse.   This information is not intended to replace advice given to you by your health care provider. Make sure you discuss any questions you have with your health care provider.   Document Released: 12/14/2003 Document Revised: 03/31/2014 Document Reviewed: 10/05/2012 Elsevier Interactive Patient Education [employer].

## 2015-11-13 NOTE — Progress Notes (Signed)
Pre visit review using our clinic review tool, if applicable. No additional management support is needed unless otherwise documented below in the visit note. 

## 2015-11-14 ENCOUNTER — Encounter: Payer: Self-pay | Admitting: Family

## 2015-11-15 NOTE — Progress Notes (Signed)
Spoke to patient. Ears still have not improved too much.  Patient has not heard anything from ENT.

## 2015-11-16 NOTE — Telephone Encounter (Signed)
Pt's appt has been rescheduled to 8/30. Pt is aware.

## 2015-11-21 DIAGNOSIS — H906 Mixed conductive and sensorineural hearing loss, bilateral: Secondary | ICD-10-CM | POA: Diagnosis not present

## 2015-11-21 DIAGNOSIS — H6121 Impacted cerumen, right ear: Secondary | ICD-10-CM | POA: Diagnosis not present

## 2015-11-21 DIAGNOSIS — H6980 Other specified disorders of Eustachian tube, unspecified ear: Secondary | ICD-10-CM | POA: Diagnosis not present

## 2015-11-28 DIAGNOSIS — H6980 Other specified disorders of Eustachian tube, unspecified ear: Secondary | ICD-10-CM | POA: Diagnosis not present

## 2015-12-12 DIAGNOSIS — H401121 Primary open-angle glaucoma, left eye, mild stage: Secondary | ICD-10-CM | POA: Diagnosis not present

## 2015-12-18 ENCOUNTER — Ambulatory Visit
Admission: RE | Admit: 2015-12-18 | Discharge: 2015-12-18 | Disposition: A | Discharge status: Home / Self Care | Payer: BLUE CROSS/BLUE SHIELD | Source: Ambulatory Visit | Attending: Internal Medicine | Admitting: Internal Medicine

## 2015-12-18 ENCOUNTER — Other Ambulatory Visit: Payer: Self-pay | Admitting: Internal Medicine

## 2015-12-18 DIAGNOSIS — Z0000 Encounter for general adult medical examination without abnormal findings: Secondary | ICD-10-CM | POA: Diagnosis not present

## 2015-12-18 DIAGNOSIS — Z1231 Encounter for screening mammogram for malignant neoplasm of breast: Secondary | ICD-10-CM | POA: Diagnosis not present

## 2016-01-17 DIAGNOSIS — Z23 Encounter for immunization: Secondary | ICD-10-CM | POA: Diagnosis not present

## 2016-02-18 DIAGNOSIS — H903 Sensorineural hearing loss, bilateral: Secondary | ICD-10-CM | POA: Diagnosis not present

## 2016-02-18 DIAGNOSIS — H6980 Other specified disorders of Eustachian tube, unspecified ear: Secondary | ICD-10-CM | POA: Diagnosis not present

## 2016-02-27 ENCOUNTER — Ambulatory Visit: Payer: BLUE CROSS/BLUE SHIELD | Admitting: Internal Medicine

## 2016-02-27 ENCOUNTER — Ambulatory Visit (INDEPENDENT_AMBULATORY_CARE_PROVIDER_SITE_OTHER): Payer: BLUE CROSS/BLUE SHIELD | Admitting: Internal Medicine

## 2016-02-27 VITALS — BP 110/76 | HR 60 | Temp 97.5°F | Resp 12 | Ht 68.0 in | Wt 267.8 lb

## 2016-02-27 DIAGNOSIS — E7800 Pure hypercholesterolemia, unspecified: Secondary | ICD-10-CM

## 2016-02-27 DIAGNOSIS — N183 Chronic kidney disease, stage 3 unspecified: Secondary | ICD-10-CM

## 2016-02-27 DIAGNOSIS — R7303 Prediabetes: Secondary | ICD-10-CM | POA: Diagnosis not present

## 2016-02-27 DIAGNOSIS — E559 Vitamin D deficiency, unspecified: Secondary | ICD-10-CM | POA: Diagnosis not present

## 2016-02-27 DIAGNOSIS — B3789 Other sites of candidiasis: Secondary | ICD-10-CM

## 2016-02-27 DIAGNOSIS — I10 Essential (primary) hypertension: Secondary | ICD-10-CM | POA: Diagnosis not present

## 2016-02-27 LAB — COMPREHENSIVE METABOLIC PANEL
ALBUMIN: 3.9 g/dL (ref 3.5–5.2)
ALK PHOS: 78 U/L (ref 39–117)
ALT: 16 U/L (ref 0–35)
AST: 20 U/L (ref 0–37)
BILIRUBIN TOTAL: 0.8 mg/dL (ref 0.2–1.2)
BUN: 19 mg/dL (ref 6–23)
CO2: 26 mEq/L (ref 19–32)
Calcium: 9.2 mg/dL (ref 8.4–10.5)
Chloride: 104 mEq/L (ref 96–112)
Creatinine, Ser: 1.13 mg/dL (ref 0.40–1.20)
GFR: 50.8 mL/min — ABNORMAL LOW (ref >60.00)
GLUCOSE: 128 mg/dL — AB (ref 70–99)
POTASSIUM: 4.7 meq/L (ref 3.5–5.1)
SODIUM: 138 meq/L (ref 135–145)
TOTAL PROTEIN: 6.6 g/dL (ref 6.0–8.3)

## 2016-02-27 LAB — LIPID PANEL
Cholesterol: 246 mg/dL — ABNORMAL HIGH (ref 0–200)
HDL: 48 mg/dL (ref >39.00)
NONHDL: 197.51
Total CHOL/HDL Ratio: 5
Triglycerides: 208 mg/dL — ABNORMAL HIGH (ref 0.0–149.0)
VLDL: 41.6 mg/dL — ABNORMAL HIGH (ref 0.0–40.0)

## 2016-02-27 LAB — HEMOGLOBIN A1C: HEMOGLOBIN A1C: 6 % (ref 4.6–6.5)

## 2016-02-27 LAB — LDL CHOLESTEROL, DIRECT: LDL DIRECT: 147 mg/dL

## 2016-02-27 LAB — VITAMIN D 25 HYDROXY (VIT D DEFICIENCY, FRACTURES): VITD: 23.94 ng/mL — AB (ref 30.00–100.00)

## 2016-02-27 MED ORDER — NYSTATIN-TRIAMCINOLONE 100000-0.1 UNIT/GM-% EX CREA: TOPICAL_CREAM | CUTANEOUS | 3 refills | Status: DC

## 2016-02-27 NOTE — Patient Instructions (Addendum)
.  Please take a probiotic ( Align, Floraque or Culturelle), the generic version of one of these over the counter medications, or an alternative form (kombucha,  [employer], or another dietary source) for a minimum of 3 weeks  Whenever you are given a probiotic to prevent a serious antibiotic associated diarrhea  Called clostridium dificile colitis.  Taking a probiotic may also prevent vaginitis due to yeast infections and can be continued indefinitely if you feel that it improves your digestion or your elimination (bowels).     Gold Bold medicated Powder with zinc:  Worth trying after a treatment of candida flare to prevent steroid related skin atrophy    Here's my cell if you need t contact me [phone]

## 2016-02-27 NOTE — Progress Notes (Signed)
Pre-visit discussion using our clinic review tool. No additional management support is needed unless otherwise documented below in the visit note.  

## 2016-02-27 NOTE — Progress Notes (Signed)
Subjective:  Patient ID: Savannah Hood, female    DOB: 09-09-1945  Age: 70 y.o. MRN: [id number]  CC: The primary encounter diagnosis was Pure hypercholesterolemia. Diagnoses of Vitamin D deficiency, Prediabetes, Essential hypertension, CKD (chronic kidney disease) stage 3, GFR 30-59 ml/min, and Candida rash of groin were also pertinent to this visit.  HPI Savannah Hood presents for transition of care from dr walker.    bp fine,  Weight stable bmi 40   Due for labs a1c 5.9 Colonoscopy 2011  Clean history of polyps since childhood   [facility] ,     History of elevated glucose with statin therapy,  Stopped statin,  checkking a1c's.  Chronic otitis media this summer brought on by cabin pressure changes during trip to [place] .  Finally resolved after prednisone ,6 day taper and 3 rounds of antibiotics   Saw Vaught.  Ongoing  Still has PND,  Some tinnitius some pressure.    Some left knee pain chronic intermittent . DJD/OA  By [facility] eval years ago  Taking turrneric and krill oil.     Needs dexa at [facility] last one 2011  t score +1.4!?   Outpatient Medications Prior to Visit  Medication Sig Dispense Refill  . amLODipine (NORVASC) 10 MG tablet Take 1 tablet (10 mg total) by mouth daily. 90 tablet 3  . aspirin 81 MG tablet Take 81 mg by mouth daily.      Marland Kitchen KRILL OIL PO Take 1 capsule by mouth daily.      Marland Kitchen latanoprost (XALATAN) 0.005 % ophthalmic solution Place 1 drop into both eyes daily.      Marland Kitchen losartan (COZAAR) 100 MG tablet Take 1 tablet (100 mg total) by mouth daily. 90 tablet 3  . LUTEIN PO Take 1 tablet by mouth daily.      . metoprolol succinate (TOPROL-XL) 50 MG 24 hr tablet Take 1 tablet (50 mg total) by mouth daily. Take with or immediately following a meal. 90 tablet 3  . Multiple Vitamins-Minerals (CENTRUM SILVER ULTRA WOMENS PO) Take by mouth.    . nystatin (MYCOSTATIN/NYSTOP) 100000 UNIT/GM POWD APPLY TO AFFECTED AREA 4 TIMES A DAY 120 g 2  . ofloxacin  (FLOXIN) 0.3 % otic solution 10 gtts daily in affected ear(s) for 7 days. 4 mL 0  . vitamin B-12 (CYANOCOBALAMIN) 1000 MCG tablet Take 1,000 mcg by mouth daily.    Marland Kitchen nystatin-triamcinolone (MYCOLOG II) cream APPLY TO AFFECTED AREA TWICE A DAY 180 g 3   No facility-administered medications prior to visit.     Review of Systems;  Patient denies headache, fevers, malaise, unintentional weight loss, skin rash, eye pain, sinus congestion and sinus pain, sore throat, dysphagia,  hemoptysis , cough, dyspnea, wheezing, chest pain, palpitations, orthopnea, edema, abdominal pain, nausea, melena, diarrhea, constipation, flank pain, dysuria, hematuria, urinary  Frequency, nocturia, numbness, tingling, seizures,  Focal weakness, Loss of consciousness,  Tremor, insomnia, depression, anxiety, and suicidal ideation.      Objective:  BP 110/76   Pulse 60   Temp 97.5 F (36.4 C) (Oral)   Resp 12   Ht 5\' 8"  (1.727 m)   Wt 267 lb 12 oz (121.5 kg)   SpO2 96%   BMI 40.71 kg/m   BP Readings from Last 3 Encounters:  02/27/16 110/76  11/13/15 128/78  08/28/15 136/78    Wt Readings from Last 3 Encounters:  02/27/16 267 lb 12 oz (121.5 kg)  11/13/15 265 lb 12.8 oz (120.6 kg)  08/28/15  269 lb 3.2 oz (122.1 kg)    General appearance: alert, cooperative and appears stated age Ears: normal TM's and external ear canals both ears Throat: lips, mucosa, and tongue normal; teeth and gums normal Neck: no adenopathy, no carotid bruit, supple, symmetrical, trachea midline and thyroid not enlarged, symmetric, no tenderness/mass/nodules Back: symmetric, no curvature. ROM normal. No CVA tenderness. Lungs: clear to auscultation bilaterally Heart: regular rate and rhythm, S1, S2 normal, no murmur, click, rub or gallop Abdomen: soft, non-tender; bowel sounds normal; no masses,  no organomegaly Pulses: 2+ and symmetric Skin: Skin color, texture, turgor normal. No rashes or lesions Lymph nodes: Cervical,  supraclavicular, and axillary nodes normal.  Lab Results  Component Value Date   HGBA1C 6.0 02/27/2016   HGBA1C 5.9 08/28/2015   HGBA1C 6.1 08/16/2014    Lab Results  Component Value Date   CREATININE 1.13 02/27/2016   CREATININE 1.11 08/28/2015   CREATININE 1.11 08/16/2014    Lab Results  Component Value Date   WBC 7.8 08/28/2015   HGB 14.0 08/28/2015   HCT 40.8 08/28/2015   PLT 206.0 08/28/2015   GLUCOSE 128 (H) 02/27/2016   CHOL 246 (H) 02/27/2016   TRIG 208.0 (H) 02/27/2016   HDL 48.00 02/27/2016   LDLDIRECT 147.0 02/27/2016   LDLCALC 148 (H) 08/28/2015   ALT 16 02/27/2016   AST 20 02/27/2016   NA 138 02/27/2016   K 4.7 02/27/2016   CL 104 02/27/2016   CREATININE 1.13 02/27/2016   BUN 19 02/27/2016   CO2 26 02/27/2016   TSH 2.37 08/28/2015   HGBA1C 6.0 02/27/2016   MICROALBUR <0.7 08/28/2015    Mm Screening Breast Tomo Bilateral  Result Date: 12/18/2015 CLINICAL DATA:  Screening. EXAM: 2D DIGITAL SCREENING BILATERAL MAMMOGRAM WITH CAD AND ADJUNCT TOMO COMPARISON:  Previous exam(s). ACR Breast Density Category a: The breast tissue is almost entirely fatty. FINDINGS: There are no findings suspicious for malignancy. Images were processed with CAD. IMPRESSION: No mammographic evidence of malignancy. A result letter of this screening mammogram will be mailed directly to the patient. RECOMMENDATION: Screening mammogram in one year. (Code:SM-B-01Y) BI-RADS CATEGORY  1: Negative. Electronically Signed   By: Altamese Cabal M.D.   On: 12/18/2015 10:59    Assessment & Plan:   Problem List Items Addressed This Visit    Candida rash of groin    Recommend daily use of gold bond powder with zinc to prevent recurrence       Relevant Medications   nystatin-triamcinolone (MYCOLOG II) cream   CKD (chronic kidney disease) stage 3, GFR 30-59 ml/min    Renal function is at  baseline with avoidance of NSAIDs.  She is on an ARB for control of hypertension,  Lab Results    Component Value Date   CREATININE 1.13 02/27/2016   Lab Results  Component Value Date   NA 138 02/27/2016   K 4.7 02/27/2016   CL 104 02/27/2016   CO2 26 02/27/2016         Hypertension    Well controlled on current regimen. Renal function stable, no changes today.  Lab Results  Component Value Date   CREATININE 1.13 02/27/2016   Lab Results  Component Value Date   NA 138 02/27/2016   K 4.7 02/27/2016   CL 104 02/27/2016   CO2 26 02/27/2016         Prediabetes    Her  random glucose is not  elevated but her rising A1c suggests she is at risk for  developing diabetes.  I recommend she follow a low glycemic index diet and particpate regularly in an aerobic  exercise activity.  We should check an A1c in 6 months.   Lab Results  Component Value Date   HGBA1C 6.0 02/27/2016         Relevant Orders   Comprehensive metabolic panel (Completed)   Hemoglobin A1c (Completed)    Other Visit Diagnoses    Pure hypercholesterolemia    -  Primary   Relevant Orders   Lipid panel (Completed)   Vitamin D deficiency       Relevant Orders   VITAMIN D 25 Hydroxy (Vit-D Deficiency, Fractures) (Completed)      I am having Ms. Plucinski maintain her latanoprost, LUTEIN PO, KRILL OIL PO, aspirin, vitamin B-12, Multiple Vitamins-Minerals (CENTRUM SILVER ULTRA WOMENS PO), nystatin, metoprolol succinate, losartan, amLODipine, ofloxacin, timolol, Vitamin D3, Magnesium Citrate, Co Q10, Turmeric Curcumin, and nystatin-triamcinolone.  Meds ordered this encounter  Medications  . timolol (TIMOPTIC) 0.5 % ophthalmic solution    Sig: INSTILL 1 DROP INTO LEFT EYE EVERY DAY    Refill:  6  . Cholecalciferol (VITAMIN D3) 1000 units CAPS    Sig: Take 1 capsule by mouth daily.  . Magnesium Citrate 100 MG TABS    Sig: Take 150 mg by mouth daily.   . Coenzyme Q10 (CO Q10) 100 MG CAPS    Sig: Take 1 capsule by mouth daily.  . Misc Natural Products (TURMERIC CURCUMIN) CAPS    Sig: Take 1 capsule  by mouth daily.  Marland Kitchen nystatin-triamcinolone (MYCOLOG II) cream    Sig: APPLY TO AFFECTED AREA TWICE A DAY    Dispense:  180 g    Refill:  3    Medications Discontinued During This Encounter  Medication Reason  . nystatin-triamcinolone (MYCOLOG II) cream Reorder   A total of 25 minutes of face to face time was spent with patient more than half of which was spent in counselling about the above mentioned conditions  and coordination of care  Follow-up: Return in about 6 months (around 08/27/2016) for CPE/wellness .   Crecencio Mc, MD

## 2016-02-28 NOTE — Assessment & Plan Note (Signed)
Renal function is at  baseline with avoidance of NSAIDs.  She is on an ARB for control of hypertension,  Lab Results  Component Value Date   CREATININE 1.13 02/27/2016   Lab Results  Component Value Date   NA 138 02/27/2016   K 4.7 02/27/2016   CL 104 02/27/2016   CO2 26 02/27/2016

## 2016-02-28 NOTE — Assessment & Plan Note (Signed)
Well controlled on current regimen. Renal function stable, no changes today.  Lab Results  Component Value Date   CREATININE 1.13 02/27/2016   Lab Results  Component Value Date   NA 138 02/27/2016   K 4.7 02/27/2016   CL 104 02/27/2016   CO2 26 02/27/2016

## 2016-02-28 NOTE — Assessment & Plan Note (Signed)
Her  random glucose is not  elevated but her rising A1c suggests she is at risk for developing diabetes.  I recommend she follow a low glycemic index diet and particpate regularly in an aerobic  exercise activity.  We should check an A1c in 6 months.   Lab Results  Component Value Date   HGBA1C 6.0 02/27/2016

## 2016-02-28 NOTE — Assessment & Plan Note (Signed)
Recommend daily use of gold bond powder with zinc to prevent recurrence

## 2016-03-01 ENCOUNTER — Other Ambulatory Visit: Payer: Self-pay | Admitting: Internal Medicine

## 2016-03-01 DIAGNOSIS — E559 Vitamin D deficiency, unspecified: Secondary | ICD-10-CM

## 2016-03-01 DIAGNOSIS — E7800 Pure hypercholesterolemia, unspecified: Secondary | ICD-10-CM

## 2016-03-01 DIAGNOSIS — R7303 Prediabetes: Secondary | ICD-10-CM

## 2016-03-01 MED ORDER — ERGOCALCIFEROL 1.25 MG (50000 UT) PO CAPS: 50000.0000 [IU] | ORAL_CAPSULE | ORAL | 3 refills | Status: DC

## 2016-03-31 DIAGNOSIS — H903 Sensorineural hearing loss, bilateral: Secondary | ICD-10-CM | POA: Diagnosis not present

## 2016-03-31 DIAGNOSIS — H6980 Other specified disorders of Eustachian tube, unspecified ear: Secondary | ICD-10-CM | POA: Diagnosis not present

## 2016-05-13 DIAGNOSIS — H903 Sensorineural hearing loss, bilateral: Secondary | ICD-10-CM | POA: Diagnosis not present

## 2016-06-16 DIAGNOSIS — H401121 Primary open-angle glaucoma, left eye, mild stage: Secondary | ICD-10-CM | POA: Diagnosis not present

## 2016-06-24 DIAGNOSIS — H401124 Primary open-angle glaucoma, left eye, indeterminate stage: Secondary | ICD-10-CM | POA: Diagnosis not present

## 2016-08-20 ENCOUNTER — Other Ambulatory Visit: Payer: Self-pay

## 2016-08-20 MED ORDER — METOPROLOL SUCCINATE ER 50 MG PO TB24
50.0000 mg | ORAL_TABLET | Freq: Every day | ORAL | 0 refills | Status: DC
Start: 2016-08-20 — End: 2016-11-20

## 2016-08-28 ENCOUNTER — Encounter: Payer: Self-pay | Admitting: Internal Medicine

## 2016-08-28 ENCOUNTER — Ambulatory Visit (INDEPENDENT_AMBULATORY_CARE_PROVIDER_SITE_OTHER): Payer: BLUE CROSS/BLUE SHIELD | Admitting: Internal Medicine

## 2016-08-28 VITALS — BP 128/76 | HR 61 | Temp 98.1°F | Resp 16 | Ht 66.75 in | Wt 247.0 lb

## 2016-08-28 DIAGNOSIS — R5383 Other fatigue: Secondary | ICD-10-CM | POA: Diagnosis not present

## 2016-08-28 DIAGNOSIS — Z0000 Encounter for general adult medical examination without abnormal findings: Secondary | ICD-10-CM

## 2016-08-28 DIAGNOSIS — R7303 Prediabetes: Secondary | ICD-10-CM

## 2016-08-28 DIAGNOSIS — I10 Essential (primary) hypertension: Secondary | ICD-10-CM | POA: Diagnosis not present

## 2016-08-28 DIAGNOSIS — N183 Chronic kidney disease, stage 3 unspecified: Secondary | ICD-10-CM

## 2016-08-28 DIAGNOSIS — Z6837 Body mass index (BMI) 37.0-37.9, adult: Secondary | ICD-10-CM

## 2016-08-28 DIAGNOSIS — E785 Hyperlipidemia, unspecified: Secondary | ICD-10-CM | POA: Diagnosis not present

## 2016-08-28 DIAGNOSIS — E1169 Type 2 diabetes mellitus with other specified complication: Secondary | ICD-10-CM | POA: Diagnosis not present

## 2016-08-28 DIAGNOSIS — Z1231 Encounter for screening mammogram for malignant neoplasm of breast: Secondary | ICD-10-CM

## 2016-08-28 DIAGNOSIS — Z1211 Encounter for screening for malignant neoplasm of colon: Secondary | ICD-10-CM | POA: Diagnosis not present

## 2016-08-28 DIAGNOSIS — Z1239 Encounter for other screening for malignant neoplasm of breast: Secondary | ICD-10-CM

## 2016-08-28 DIAGNOSIS — E66812 Obesity, class 2: Secondary | ICD-10-CM

## 2016-08-28 DIAGNOSIS — E6609 Other obesity due to excess calories: Secondary | ICD-10-CM

## 2016-08-28 LAB — LIPID PANEL
CHOL/HDL RATIO: 6
CHOLESTEROL: 221 mg/dL — AB (ref 0–200)
HDL: 38.4 mg/dL — AB (ref >39.00)
NonHDL: 182.6
TRIGLYCERIDES: 219 mg/dL — AB (ref 0.0–149.0)
VLDL: 43.8 mg/dL — ABNORMAL HIGH (ref 0.0–40.0)

## 2016-08-28 LAB — COMPREHENSIVE METABOLIC PANEL
ALBUMIN: 4 g/dL (ref 3.5–5.2)
ALK PHOS: 102 U/L (ref 39–117)
ALT: 21 U/L (ref 0–35)
AST: 20 U/L (ref 0–37)
BUN: 22 mg/dL (ref 6–23)
CO2: 28 mEq/L (ref 19–32)
Calcium: 9.6 mg/dL (ref 8.4–10.5)
Chloride: 105 mEq/L (ref 96–112)
Creatinine, Ser: 1.2 mg/dL (ref 0.40–1.20)
GFR: 47.33 mL/min — AB (ref >60.00)
Glucose, Bld: 129 mg/dL — ABNORMAL HIGH (ref 70–99)
POTASSIUM: 4.6 meq/L (ref 3.5–5.1)
Sodium: 140 mEq/L (ref 135–145)
TOTAL PROTEIN: 6.6 g/dL (ref 6.0–8.3)
Total Bilirubin: 0.6 mg/dL (ref 0.2–1.2)

## 2016-08-28 LAB — HEMOGLOBIN A1C: HEMOGLOBIN A1C: 5.7 % (ref 4.6–6.5)

## 2016-08-28 LAB — HEPATITIS C ANTIBODY: HCV Ab: NEGATIVE

## 2016-08-28 LAB — MICROALBUMIN / CREATININE URINE RATIO
Creatinine,U: 264.9 mg/dL
MICROALB/CREAT RATIO: 0.4 mg/g (ref 0.0–30.0)
Microalb, Ur: 1.1 mg/dL (ref 0.0–1.9)

## 2016-08-28 LAB — LDL CHOLESTEROL, DIRECT: LDL DIRECT: 140 mg/dL

## 2016-08-28 MED ORDER — DOXYCYCLINE HYCLATE 100 MG PO TABS: 100.0000 mg | ORAL_TABLET | Freq: Two times a day (BID) | ORAL | 0 refills | Status: DC

## 2016-08-28 MED ORDER — AMOXICILLIN-POT CLAVULANATE 875-125 MG PO TABS: 1.0000 | ORAL_TABLET | Freq: Two times a day (BID) | ORAL | 0 refills | Status: DC

## 2016-08-28 NOTE — Progress Notes (Signed)
Patient ID: Savannah Hood, female    DOB: June 20, 1945  Age: 71 y.o. MRN: [id number]  The patient is here for annual physical  examination and management of other chronic and acute problems.   mammogram normal sept 2017 annually needed Colonoscopy was done in  2011 polyps,  Overdue for 5 yr follow up per chart [facility] in [place] 2011 Due for hep c screen  The risk factors are reflected in the social history.  The roster of all physicians providing medical care to patient - is listed in the Snapshot section of the chart.  Activities of daily living:  The patient is 100% independent in all ADLs: dressing, toileting, feeding as well as independent mobility  Home safety : The patient has smoke detectors in the home. They wear seatbelts.  There are no firearms at home. There is no violence in the home.   There is no risks for hepatitis, STDs or HIV. There is no   history of blood transfusion. They have no travel history to infectious disease endemic areas of the world.  The patient has seen their dentist in the last six month. They have seen their eye doctor in the last year.   They have deferred audiologic testing in the last year.  They do not  have excessive sun exposure. Discussed the need for sun protection: hats, long sleeves and use of sunscreen if there is significant sun exposure.   Diet: the importance of a healthy diet is discussed.  She has achieved a 27 lb weight loss in 4 months with weight watchers.  The benefits of regular aerobic exercise were discussed. She walks 4 times per week ,  20 minutes.   Depression screen: there are no signs or vegative symptoms of depression- irritability, change in appetite, anhedonia, sadness/tearfullness.  Cognitive assessment: the patient manages all their financial and personal affairs and is actively engaged. They could relate day,date,year and events; recalled 2/3 objects at 3 minutes; performed clock-face test normally.  The following  portions of the patient's history were reviewed and updated as appropriate: allergies, current medications, past family history, past medical history,  past surgical history, past social history  and problem list.  Visual acuity was not assessed per patient preference since she has regular follow up with her ophthalmologist. Hearing and body mass index were assessed and reviewed.   During the course of the visit the patient was educated and counseled about appropriate screening and preventive services including : fall prevention , diabetes screening, nutrition counseling, colorectal cancer screening, and recommended immunizations.    CC: The primary encounter diagnosis was Colon cancer screening. Diagnoses of Fatigue, unspecified type, Prediabetes, Hyperlipidemia associated with type 2 diabetes mellitus ([place]), Essential hypertension, Breast cancer screening, Routine general medical examination at a health care facility, CKD (chronic kidney disease) stage 3, GFR 30-59 ml/min, and Class 2 obesity due to excess calories without serious comorbidity with body mass index (BMI) of 37.0 to 37.9 in adult were also pertinent to this visit.  History of hearing loss last august due to flying to [place] with congestion and resultant persistent otitis media. .  Bilateral hearing aids.   History of obesity prior trila of metformin by Sher[facility]ge.   Goal is 220 .   History Nanami has a past medical history of Arthritis; Colon polyps; Diabetes mellitus; Glaucoma; Hyperlipidemia; Hypertension; and Obesity. losing wt with wt watchers . Previous success 75 lb wt loss and a1c normalized.    She has a past  surgical history that includes Abdominal hysterectomy.   Her family history includes Arthritis in her mother and sister; Cancer in her father; Diabetes in her father and paternal grandmother.She reports that she has never smoked. She has never used smokeless tobacco. She reports that she drinks alcohol. She  reports that she does not use drugs.  Outpatient Medications Prior to Visit  Medication Sig Dispense Refill  . amLODipine (NORVASC) 10 MG tablet Take 1 tablet (10 mg total) by mouth daily. 90 tablet 3  . aspirin 81 MG tablet Take 81 mg by mouth daily.      . Cholecalciferol (VITAMIN D3) 1000 units CAPS Take 1 capsule by mouth daily.    . Coenzyme Q10 (CO Q10) 100 MG CAPS Take 1 capsule by mouth daily.    Marland Kitchen KRILL OIL PO Take 1 capsule by mouth daily.      Marland Kitchen latanoprost (XALATAN) 0.005 % ophthalmic solution Place 1 drop into both eyes daily.      Marland Kitchen losartan (COZAAR) 100 MG tablet Take 1 tablet (100 mg total) by mouth daily. 90 tablet 3  . LUTEIN PO Take 1 tablet by mouth daily.      . Magnesium Citrate 100 MG TABS Take 150 mg by mouth daily.     . metoprolol succinate (TOPROL-XL) 50 MG 24 hr tablet Take 1 tablet (50 mg total) by mouth daily. Take with or immediately following a meal. 90 tablet 0  . Misc Natural Products (TURMERIC CURCUMIN) CAPS Take 1 capsule by mouth daily.    . Multiple Vitamins-Minerals (CENTRUM SILVER ULTRA WOMENS PO) Take by mouth.    . timolol (TIMOPTIC) 0.5 % ophthalmic solution INSTILL 1 DROP INTO LEFT EYE EVERY DAY  6  . vitamin B-12 (CYANOCOBALAMIN) 1000 MCG tablet Take 1,000 mcg by mouth daily.    . ergocalciferol (VITAMIN D2) 50000 units capsule Take 1 capsule (50,000 Units total) by mouth once a week. (Patient not taking: Reported on 08/28/2016) 4 capsule 3  . nystatin (MYCOSTATIN/NYSTOP) 100000 UNIT/GM POWD APPLY TO AFFECTED AREA 4 TIMES A DAY (Patient not taking: Reported on 08/28/2016) 120 g 2  . nystatin-triamcinolone (MYCOLOG II) cream APPLY TO AFFECTED AREA TWICE A DAY (Patient not taking: Reported on 08/28/2016) 180 g 3  . ofloxacin (FLOXIN) 0.3 % otic solution 10 gtts daily in affected ear(s) for 7 days. (Patient not taking: Reported on 08/28/2016) 4 mL 0   No facility-administered medications prior to visit.     Review of Systems   Patient denies headache,  fevers, malaise, unintentional weight loss, skin rash, eye pain, sinus congestion and sinus pain, sore throat, dysphagia,  hemoptysis , cough, dyspnea, wheezing, chest pain, palpitations, orthopnea, edema, abdominal pain, nausea, melena, diarrhea, constipation, flank pain, dysuria, hematuria, urinary  Frequency, nocturia, numbness, tingling, seizures,  Focal weakness, Loss of consciousness,  Tremor, insomnia, depression, anxiety, and suicidal ideation.      Objective:  BP 128/76 (BP Location: Left Arm, Patient Position: Sitting, Cuff Size: Normal)   Pulse 61   Temp 98.1 F (36.7 C) (Oral)   Resp 16   Ht 5' 6.75" (1.695 m)   Wt 247 lb (112 kg)   SpO2 96%   BMI 38.98 kg/m   Physical Exam   General appearance: alert, cooperative and appears stated age Head: Normocephalic, without obvious abnormality, atraumatic Eyes: conjunctivae/corneas clear. PERRL, EOM's intact. Fundi benign. Ears: normal TM's and external ear canals both ears Nose: Nares normal. Septum midline. Mucosa normal. No drainage or sinus tenderness. Throat: lips,  mucosa, and tongue normal; teeth and gums normal Neck: no adenopathy, no carotid bruit, no JVD, supple, symmetrical, trachea midline and thyroid not enlarged, symmetric, no tenderness/mass/nodules Lungs: clear to auscultation bilaterally Breasts: normal appearance, no masses or tenderness Heart: regular rate and rhythm, S1, S2 normal, no murmur, click, rub or gallop Abdomen: soft, non-tender; bowel sounds normal; no masses,  no organomegaly Extremities: extremities normal, atraumatic, no cyanosis or edema Pulses: 2+ and symmetric Skin: Skin color, texture, turgor normal. No rashes or lesions Neurologic: Alert and oriented X 3, normal strength and tone. Normal symmetric reflexes. Normal coordination and gait.     Assessment & Plan:   Problem List Items Addressed This Visit    Routine general medical examination at a health care facility    Annual comprehensive  preventive exam was done as well as an evaluation and management of chronic conditions .  During the course of the visit the patient was educated and counseled about appropriate screening and preventive services including :  diabetes screening, lipid analysis with projected  10 year  risk for CAD , nutrition counseling, breast, cervical and colorectal cancer screening, and recommended immunizations.  Printed recommendations for health maintenance screenings was given      Prediabetes    Her  random glucose is not  elevated and shehas reduced her A1c yo 5.7   I recommend she continue to follow a low glycemic index diet and particpate regularly in an aerobic  exercise activity.  We should check an A1c in 6 months.   Lab Results  Component Value Date   HGBA1C 5.7 08/28/2016         Relevant Orders   Comprehensive metabolic panel (Completed)   Hemoglobin A1c (Completed)   Obesity    I have congratulated her in reduction of   BMI and encouraged  Continued weight loss with goal of 10% of body weigh over the next 6 months using a low glycemic index diet and regular exercise a minimum of 5 days per week.        Hypertension    Well controlled on current regimen. Renal function stable, no changes today.  Lab Results  Component Value Date   CREATININE 1.20 08/28/2016   Lab Results  Component Value Date   NA 140 08/28/2016   K 4.6 08/28/2016   CL 105 08/28/2016   CO2 28 08/28/2016         Relevant Orders   Microalbumin / creatinine urine ratio (Completed)   CKD (chronic kidney disease) stage 3, GFR 30-59 ml/min    Renal function is at  baseline with avoidance of NSAIDs.  She is on an ARB for control of hypertension,  Lab Results  Component Value Date   CREATININE 1.20 08/28/2016   Lab Results  Component Value Date   NA 140 08/28/2016   K 4.6 08/28/2016   CL 105 08/28/2016   CO2 28 08/28/2016          Other Visit Diagnoses    Colon cancer screening    -  Primary    Relevant Orders   Ambulatory referral to Gastroenterology   Fatigue, unspecified type       Relevant Orders   Hepatitis C antibody (Completed)   Hyperlipidemia associated with type 2 diabetes mellitus ([place])       Relevant Orders   LDL cholesterol, direct (Completed)   Lipid panel (Completed)   Breast cancer screening       Relevant Orders   MM SCREENING  BREAST TOMO BILATERAL      I have discontinued Ms. Nosal's nystatin, ofloxacin, nystatin-triamcinolone, and ergocalciferol. I am also having her start on amoxicillin-clavulanate and doxycycline. Additionally, I am having her maintain her latanoprost, LUTEIN PO, KRILL OIL PO, aspirin, vitamin B-12, Multiple Vitamins-Minerals (CENTRUM SILVER ULTRA WOMENS PO), losartan, amLODipine, timolol, Vitamin D3, Magnesium Citrate, Co Q10, Turmeric Curcumin, metoprolol succinate, FA-BENFOTIAMINE-PABA-E-LIPOIC PO, and diphenhydrAMINE.  Meds ordered this encounter  Medications  . FA-BENFOTIAMINE-PABA-E-LIPOIC PO    Sig: Take 1 capsule by mouth daily.  . diphenhydrAMINE (BENADRYL) 25 mg capsule    Sig: Take 25 mg by mouth daily.  Marland Kitchen amoxicillin-clavulanate (AUGMENTIN) 875-125 MG tablet    Sig: Take 1 tablet by mouth 2 (two) times daily.    Dispense:  14 tablet    Refill:  0  . doxycycline (VIBRA-TABS) 100 MG tablet    Sig: Take 1 tablet (100 mg total) by mouth 2 (two) times daily.    Dispense:  20 tablet    Refill:  0    Medications Discontinued During This Encounter  Medication Reason  . ergocalciferol (VITAMIN D2) 50000 units capsule Therapy completed  . nystatin (MYCOSTATIN/NYSTOP) 100000 UNIT/GM POWD Patient has not taken in last 30 days  . nystatin-triamcinolone (MYCOLOG II) cream Patient has not taken in last 30 days  . ofloxacin (FLOXIN) 0.3 % otic solution Patient has not taken in last 30 days    Follow-up: No Follow-up on file.   Crecencio Mc, MD

## 2016-08-28 NOTE — Patient Instructions (Addendum)
The ShingRx vaccine will be available locally and  IS ADVISED for all interested adults over 50 to prevent shingles   Take the augmentin with you to [place] incase you get another sinus/ear infection  Use debrox in left ear again  .save the  doxycycline for fever plus headache or fever plus myalgias in the summertime and notify me so I can order tick fever antibodies    Taking an antibiotic can create an imbalance in the normal population of bacteria that live in the small intestine.  This imbalance can persist for 3 months.   Taking a probiotic ( Align, Floraque or Culturelle), the generic version of one of these over the counter medications, or an alternative form (kombucha,  Yogurt, or another dietary source) for a minimum of 3 weeks may help prevent a serious antibiotic associated diarrhea  Called clostridium dificile colitis that occurs when the bacteria population is altered .  Taking a probiotic may also prevent vaginitis due to yeast infections and can be continued indefinitely if you feel that it improves your digestion or your elimination (bowels).     Health Maintenance for Postmenopausal Women Menopause is a normal process in which your reproductive ability comes to an end. This process happens gradually over a span of months to years, usually between the ages of 21 and 75. Menopause is complete when you have missed 12 consecutive menstrual periods. It is important to talk with your health care provider about some of the most common conditions that affect postmenopausal women, such as heart disease, cancer, and bone loss (osteoporosis). Adopting a healthy lifestyle and getting preventive care can help to promote your health and wellness. Those actions can also lower your chances of developing some of these common conditions. What should I know about menopause? During menopause, you may experience a number of symptoms, such as:  Moderate-to-severe hot flashes.  Night  sweats.  Decrease in sex drive.  Mood swings.  Headaches.  Tiredness.  Irritability.  Memory problems.  Insomnia.  Choosing to treat or not to treat menopausal changes is an individual decision that you make with your health care provider. What should I know about hormone replacement therapy and supplements? Hormone therapy products are effective for treating symptoms that are associated with menopause, such as hot flashes and night sweats. Hormone replacement carries certain risks, especially as you become older. If you are thinking about using estrogen or estrogen with progestin treatments, discuss the benefits and risks with your health care provider. What should I know about heart disease and stroke? Heart disease, heart attack, and stroke become more likely as you age. This may be due, in part, to the hormonal changes that your body experiences during menopause. These can affect how your body processes dietary fats, triglycerides, and cholesterol. Heart attack and stroke are both medical emergencies. There are many things that you can do to help prevent heart disease and stroke:  Have your blood pressure checked at least every 1-2 years. High blood pressure causes heart disease and increases the risk of stroke.  If you are 27-69 years old, ask your health care provider if you should take aspirin to prevent a heart attack or a stroke.  Do not use any tobacco products, including cigarettes, chewing tobacco, or electronic cigarettes. If you need help quitting, ask your health care provider.  It is important to eat a healthy diet and maintain a healthy weight. ? Be sure to include plenty of vegetables, fruits, low-fat dairy products, and  lean protein. ? Avoid eating foods that are high in solid fats, added sugars, or salt (sodium).  Get regular exercise. This is one of the most important things that you can do for your health. ? Try to exercise for at least 150 minutes each week.  The type of exercise that you do should increase your heart rate and make you sweat. This is known as moderate-intensity exercise. ? Try to do strengthening exercises at least twice each week. Do these in addition to the moderate-intensity exercise.  Know your numbers.Ask your health care provider to check your cholesterol and your blood glucose. Continue to have your blood tested as directed by your health care provider.  What should I know about cancer screening? There are several types of cancer. Take the following steps to reduce your risk and to catch any cancer development as early as possible. Breast Cancer  Practice breast self-awareness. ? This means understanding how your breasts normally appear and feel. ? It also means doing regular breast self-exams. Let your health care provider know about any changes, no matter how small.  If you are 63 or older, have a clinician do a breast exam (clinical breast exam or CBE) every year. Depending on your age, family history, and medical history, it may be recommended that you also have a yearly breast X-ray (mammogram).  If you have a family history of breast cancer, talk with your health care provider about genetic screening.  If you are at high risk for breast cancer, talk with your health care provider about having an MRI and a mammogram every year.  Breast cancer (BRCA) gene test is recommended for women who have family members with BRCA-related cancers. Results of the assessment will determine the need for genetic counseling and BRCA1 and for BRCA2 testing. BRCA-related cancers include these types: ? Breast. This occurs in males or females. ? Ovarian. ? Tubal. This may also be called fallopian tube cancer. ? Cancer of the abdominal or pelvic lining (peritoneal cancer). ? Prostate. ? Pancreatic.  Cervical, Uterine, and Ovarian Cancer Your health care provider may recommend that you be screened regularly for cancer of the pelvic  organs. These include your ovaries, uterus, and vagina. This screening involves a pelvic exam, which includes checking for microscopic changes to the surface of your cervix (Pap test).  For women ages 21-65, health care providers may recommend a pelvic exam and a Pap test every three years. For women ages 33-65, they may recommend the Pap test and pelvic exam, combined with testing for human papilloma virus (HPV), every five years. Some types of HPV increase your risk of cervical cancer. Testing for HPV may also be done on women of any age who have unclear Pap test results.  Other health care providers may not recommend any screening for nonpregnant women who are considered low risk for pelvic cancer and have no symptoms. Ask your health care provider if a screening pelvic exam is right for you.  If you have had past treatment for cervical cancer or a condition that could lead to cancer, you need Pap tests and screening for cancer for at least 20 years after your treatment. If Pap tests have been discontinued for you, your risk factors (such as having a new sexual partner) need to be reassessed to determine if you should start having screenings again. Some women have medical problems that increase the chance of getting cervical cancer. In these cases, your health care provider may recommend that you have  screening and Pap tests more often.  If you have a family history of uterine cancer or ovarian cancer, talk with your health care provider about genetic screening.  If you have vaginal bleeding after reaching menopause, tell your health care provider.  There are currently no reliable tests available to screen for ovarian cancer.  Lung Cancer Lung cancer screening is recommended for adults 62-30 years old who are at high risk for lung cancer because of a history of smoking. A yearly low-dose CT scan of the lungs is recommended if you:  Currently smoke.  Have a history of at least 30 pack-years of  smoking and you currently smoke or have quit within the past 15 years. A pack-year is smoking an average of one pack of cigarettes per day for one year.  Yearly screening should:  Continue until it has been 15 years since you quit.  Stop if you develop a health problem that would prevent you from having lung cancer treatment.  Colorectal Cancer  This type of cancer can be detected and can often be prevented.  Routine colorectal cancer screening usually begins at age 71 and continues through age 57.  If you have risk factors for colon cancer, your health care provider may recommend that you be screened at an earlier age.  If you have a family history of colorectal cancer, talk with your health care provider about genetic screening.  Your health care provider may also recommend using home test kits to check for hidden blood in your stool.  A small camera at the end of a tube can be used to examine your colon directly (sigmoidoscopy or colonoscopy). This is done to check for the earliest forms of colorectal cancer.  Direct examination of the colon should be repeated every 5-10 years until age 13. However, if early forms of precancerous polyps or small growths are found or if you have a family history or genetic risk for colorectal cancer, you may need to be screened more often.  Skin Cancer  Check your skin from head to toe regularly.  Monitor any moles. Be sure to tell your health care provider: ? About any new moles or changes in moles, especially if there is a change in a mole's shape or color. ? If you have a mole that is larger than the size of a pencil eraser.  If any of your family members has a history of skin cancer, especially at a young age, talk with your health care provider about genetic screening.  Always use sunscreen. Apply sunscreen liberally and repeatedly throughout the day.  Whenever you are outside, protect yourself by wearing long sleeves, pants, a wide-brimmed  hat, and sunglasses.  What should I know about osteoporosis? Osteoporosis is a condition in which bone destruction happens more quickly than new bone creation. After menopause, you may be at an increased risk for osteoporosis. To help prevent osteoporosis or the bone fractures that can happen because of osteoporosis, the following is recommended:  If you are 23-14 years old, get at least 1,000 mg of calcium and at least 600 mg of vitamin D per day.  If you are older than age 89 but younger than age 34, get at least 1,200 mg of calcium and at least 600 mg of vitamin D per day.  If you are older than age 75, get at least 1,200 mg of calcium and at least 800 mg of vitamin D per day.  Smoking and excessive alcohol intake increase the risk  of osteoporosis. Eat foods that are rich in calcium and vitamin D, and do weight-bearing exercises several times each week as directed by your health care provider. What should I know about how menopause affects my mental health? Depression may occur at any age, but it is more common as you become older. Common symptoms of depression include:  Low or sad mood.  Changes in sleep patterns.  Changes in appetite or eating patterns.  Feeling an overall lack of motivation or enjoyment of activities that you previously enjoyed.  Frequent crying spells.  Talk with your health care provider if you think that you are experiencing depression. What should I know about immunizations? It is important that you get and maintain your immunizations. These include:  Tetanus, diphtheria, and pertussis (Tdap) booster vaccine.  Influenza every year before the flu season begins.  Pneumonia vaccine.  Shingles vaccine.  Your health care provider may also recommend other immunizations. This information is not intended to replace advice given to you by your health care provider. Make sure you discuss any questions you have with your health care provider. Document Released:  05/02/2005 Document Revised: 09/28/2015 Document Reviewed: 12/12/2014 Elsevier Interactive Patient Education  2018 [employer].

## 2016-08-30 NOTE — Assessment & Plan Note (Signed)
I have congratulated her in reduction of   BMI and encouraged  Continued weight loss with goal of 10% of body weigh over the next 6 months using a low glycemic index diet and regular exercise a minimum of 5 days per week.    

## 2016-08-30 NOTE — Assessment & Plan Note (Signed)
Well controlled on current regimen. Renal function stable, no changes today.  Lab Results  Component Value Date   CREATININE 1.20 08/28/2016   Lab Results  Component Value Date   NA 140 08/28/2016   K 4.6 08/28/2016   CL 105 08/28/2016   CO2 28 08/28/2016

## 2016-08-30 NOTE — Assessment & Plan Note (Signed)
Annual comprehensive preventive exam was done as well as an evaluation and management of chronic conditions .  During the course of the visit the patient was educated and counseled about appropriate screening and preventive services including :  diabetes screening, lipid analysis with projected  10 year  risk for CAD , nutrition counseling, breast, cervical and colorectal cancer screening, and recommended immunizations.  Printed recommendations for health maintenance screenings was given 

## 2016-08-30 NOTE — Assessment & Plan Note (Signed)
Renal function is at  baseline with avoidance of NSAIDs.  She is on an ARB for control of hypertension,  Lab Results  Component Value Date   CREATININE 1.20 08/28/2016   Lab Results  Component Value Date   NA 140 08/28/2016   K 4.6 08/28/2016   CL 105 08/28/2016   CO2 28 08/28/2016

## 2016-08-30 NOTE — Assessment & Plan Note (Addendum)
Her  random glucose is not  elevated and shehas reduced her A1c yo 5.7   I recommend she continue to follow a low glycemic index diet and particpate regularly in an aerobic  exercise activity.  We should check an A1c in 6 months.   Lab Results  Component Value Date   HGBA1C 5.7 08/28/2016

## 2016-08-31 ENCOUNTER — Encounter: Payer: Self-pay | Admitting: Internal Medicine

## 2016-09-22 ENCOUNTER — Other Ambulatory Visit: Payer: Self-pay

## 2016-09-22 MED ORDER — LOSARTAN POTASSIUM 100 MG PO TABS: 100.0000 mg | ORAL_TABLET | Freq: Every day | ORAL | 3 refills | Status: DC

## 2016-09-22 MED ORDER — AMLODIPINE BESYLATE 10 MG PO TABS: 10.0000 mg | ORAL_TABLET | Freq: Every day | ORAL | 3 refills | Status: DC

## 2016-09-29 ENCOUNTER — Encounter: Payer: Self-pay | Admitting: Internal Medicine

## 2016-09-29 DIAGNOSIS — Z1211 Encounter for screening for malignant neoplasm of colon: Secondary | ICD-10-CM | POA: Diagnosis not present

## 2016-09-29 DIAGNOSIS — K635 Polyp of colon: Secondary | ICD-10-CM | POA: Diagnosis not present

## 2016-09-29 DIAGNOSIS — D125 Benign neoplasm of sigmoid colon: Secondary | ICD-10-CM | POA: Diagnosis not present

## 2016-09-29 DIAGNOSIS — K5730 Diverticulosis of large intestine without perforation or abscess without bleeding: Secondary | ICD-10-CM | POA: Diagnosis not present

## 2016-09-29 LAB — HM COLONOSCOPY

## 2016-11-20 ENCOUNTER — Other Ambulatory Visit: Payer: Self-pay | Admitting: Internal Medicine

## 2016-12-23 DIAGNOSIS — H401121 Primary open-angle glaucoma, left eye, mild stage: Secondary | ICD-10-CM | POA: Diagnosis not present

## 2017-01-09 ENCOUNTER — Ambulatory Visit (INDEPENDENT_AMBULATORY_CARE_PROVIDER_SITE_OTHER): Payer: BLUE CROSS/BLUE SHIELD

## 2017-01-09 DIAGNOSIS — Z23 Encounter for immunization: Secondary | ICD-10-CM

## 2017-01-12 ENCOUNTER — Ambulatory Visit
Admission: RE | Admit: 2017-01-12 | Discharge: 2017-01-12 | Disposition: A | Discharge status: Home / Self Care | Payer: BLUE CROSS/BLUE SHIELD | Source: Ambulatory Visit | Attending: Internal Medicine | Admitting: Internal Medicine

## 2017-01-12 DIAGNOSIS — Z1239 Encounter for other screening for malignant neoplasm of breast: Secondary | ICD-10-CM

## 2017-01-12 DIAGNOSIS — Z1231 Encounter for screening mammogram for malignant neoplasm of breast: Secondary | ICD-10-CM | POA: Diagnosis not present

## 2017-02-03 ENCOUNTER — Encounter: Payer: Self-pay | Admitting: Internal Medicine

## 2017-02-05 ENCOUNTER — Other Ambulatory Visit: Payer: Self-pay | Admitting: Internal Medicine

## 2017-02-05 MED ORDER — VALSARTAN 160 MG PO TABS: 160.0000 mg | ORAL_TABLET | Freq: Every day | ORAL | 0 refills | Status: DC

## 2017-02-05 NOTE — Progress Notes (Signed)
Losartan dc'd due to recall and valsartan sent

## 2017-02-15 ENCOUNTER — Other Ambulatory Visit: Payer: Self-pay | Admitting: Internal Medicine

## 2017-03-02 ENCOUNTER — Ambulatory Visit: Payer: BLUE CROSS/BLUE SHIELD | Admitting: Internal Medicine

## 2017-03-02 ENCOUNTER — Encounter: Payer: Self-pay | Admitting: Internal Medicine

## 2017-03-04 ENCOUNTER — Other Ambulatory Visit: Payer: Self-pay | Admitting: Internal Medicine

## 2017-03-04 MED ORDER — LOSARTAN POTASSIUM-HCTZ 100-25 MG PO TABS: 1.0000 | ORAL_TABLET | Freq: Every day | ORAL | 0 refills | Status: DC

## 2017-03-06 MED ORDER — LOSARTAN POTASSIUM 100 MG PO TABS: 100.0000 mg | ORAL_TABLET | Freq: Every day | ORAL | 3 refills | Status: DC

## 2017-04-10 ENCOUNTER — Encounter: Payer: Self-pay | Admitting: Internal Medicine

## 2017-04-10 ENCOUNTER — Ambulatory Visit: Payer: BLUE CROSS/BLUE SHIELD | Admitting: Internal Medicine

## 2017-04-10 VITALS — BP 122/74 | HR 54 | Temp 98.2°F | Resp 15 | Ht 66.75 in | Wt 234.8 lb

## 2017-04-10 DIAGNOSIS — R7303 Prediabetes: Secondary | ICD-10-CM

## 2017-04-10 DIAGNOSIS — I10 Essential (primary) hypertension: Secondary | ICD-10-CM

## 2017-04-10 DIAGNOSIS — N183 Chronic kidney disease, stage 3 unspecified: Secondary | ICD-10-CM

## 2017-04-10 DIAGNOSIS — Z6835 Body mass index (BMI) 35.0-35.9, adult: Secondary | ICD-10-CM

## 2017-04-10 DIAGNOSIS — E6609 Other obesity due to excess calories: Secondary | ICD-10-CM | POA: Diagnosis not present

## 2017-04-10 DIAGNOSIS — E559 Vitamin D deficiency, unspecified: Secondary | ICD-10-CM | POA: Diagnosis not present

## 2017-04-10 DIAGNOSIS — E7849 Other hyperlipidemia: Secondary | ICD-10-CM

## 2017-04-10 LAB — COMPREHENSIVE METABOLIC PANEL
ALT: 26 U/L (ref 0–35)
AST: 26 U/L (ref 0–37)
Albumin: 4 g/dL (ref 3.5–5.2)
Alkaline Phosphatase: 88 U/L (ref 39–117)
BUN: 28 mg/dL — ABNORMAL HIGH (ref 6–23)
CHLORIDE: 102 meq/L (ref 96–112)
CO2: 29 mEq/L (ref 19–32)
Calcium: 9.5 mg/dL (ref 8.4–10.5)
Creatinine, Ser: 1.15 mg/dL (ref 0.40–1.20)
GFR: 49.62 mL/min — AB (ref >60.00)
GLUCOSE: 111 mg/dL — AB (ref 70–99)
POTASSIUM: 4.8 meq/L (ref 3.5–5.1)
SODIUM: 137 meq/L (ref 135–145)
Total Bilirubin: 0.8 mg/dL (ref 0.2–1.2)
Total Protein: 6.9 g/dL (ref 6.0–8.3)

## 2017-04-10 LAB — LIPID PANEL
CHOL/HDL RATIO: 4
Cholesterol: 196 mg/dL (ref 0–200)
HDL: 50.9 mg/dL (ref >39.00)
LDL CALC: 126 mg/dL — AB (ref 0–99)
NONHDL: 145.17
Triglycerides: 96 mg/dL (ref 0.0–149.0)
VLDL: 19.2 mg/dL (ref 0.0–40.0)

## 2017-04-10 LAB — HEMOGLOBIN A1C: HEMOGLOBIN A1C: 5.4 % (ref 4.6–6.5)

## 2017-04-10 LAB — VITAMIN D 25 HYDROXY (VIT D DEFICIENCY, FRACTURES): VITD: 26.04 ng/mL — ABNORMAL LOW (ref 30.00–100.00)

## 2017-04-10 MED ORDER — ZOSTER VAC RECOMB ADJUVANTED 50 MCG/0.5ML IM SUSR: 0.5000 mL | Freq: Once | INTRAMUSCULAR | 1 refills | Status: AC

## 2017-04-10 NOTE — Progress Notes (Signed)
Subjective:  Patient ID: Savannah Hood, female    DOB: 1945/07/01  Age: 72 y.o. MRN: [id number]  CC: The primary encounter diagnosis was Familial hyperlipidemia, high LDL. Diagnoses of Vitamin D deficiency, CKD (chronic kidney disease) stage 3, GFR 30-59 ml/min (HCC), Prediabetes, Essential hypertension, and Class 2 obesity due to excess calories without serious comorbidity with body mass index (BMI) of 35.0 to 35.9 in adult were also pertinent to this visit. HPI [name] presents for follow up on prediabetes complicated by hypertension and obesity.    Obesity:  She has achieved a 7 lb weight loss since June,  And a net total of 33 lbs since last December using the [employer] Low Glycemic index program.  Her personal goal is 220 lbs.  She feels good, is exercising  Daily using a walking pgram.    Currently Finishing a course of PCN for treatment of an asymptomatic Dental abscessdiscovered on routein films by her dentist.  She is scheduled for a  root canal by [facility] on Tuesday   Hypertension: patient checks blood pressure twice weekly at home.  Readings have been for the most part > 140/80 at rest . Patient is following a reduced salt diet and is taking medications as prescribed    Outpatient Medications Prior to Visit  Medication Sig Dispense Refill  . amLODipine (NORVASC) 10 MG tablet Take 1 tablet (10 mg total) by mouth daily. 90 tablet 3  . Cholecalciferol (VITAMIN D3) 1000 units CAPS Take 2 capsules by mouth daily.     . Coenzyme Q10 (CO Q10) 100 MG CAPS Take 1 capsule by mouth daily.    Marland Kitchen FA-BENFOTIAMINE-PABA-E-LIPOIC PO Take 1 capsule by mouth daily.    Marland Kitchen KRILL OIL PO Take 1 capsule by mouth daily.      Marland Kitchen latanoprost (XALATAN) 0.005 % ophthalmic solution Place 1 drop into both eyes daily.      Marland Kitchen losartan (COZAAR) 100 MG tablet Take 1 tablet (100 mg total) by mouth daily. 90 tablet 3  . LUTEIN PO Take 1 tablet by mouth daily.      . Magnesium Citrate 100 MG TABS  Take 150 mg by mouth daily.     . metoprolol succinate (TOPROL-XL) 50 MG 24 hr tablet TAKE 1 TABLET (50 MG TOTAL) BY MOUTH DAILY. TAKE WITH OR IMMEDIATELY FOLLOWING A MEAL. 90 tablet 0  . Misc Natural Products (TURMERIC CURCUMIN) CAPS Take 1 capsule by mouth daily.    . Multiple Vitamins-Minerals (CENTRUM SILVER ULTRA WOMENS PO) Take by mouth.    . Multiple Vitamins-Minerals (PRESERVISION AREDS 2 PO) Take 1 tablet by mouth daily.    . timolol (TIMOPTIC) 0.5 % ophthalmic solution INSTILL 1 DROP INTO LEFT EYE EVERY DAY  6  . vitamin B-12 (CYANOCOBALAMIN) 1000 MCG tablet Take 1,000 mcg by mouth daily.    Marland Kitchen amoxicillin-clavulanate (AUGMENTIN) 875-125 MG tablet Take 1 tablet by mouth 2 (two) times daily. (Patient not taking: Reported on 04/10/2017) 14 tablet 0  . aspirin 81 MG tablet Take 81 mg by mouth daily.      . diphenhydrAMINE (BENADRYL) 25 mg capsule Take 25 mg by mouth daily.    Marland Kitchen doxycycline (VIBRA-TABS) 100 MG tablet Take 1 tablet (100 mg total) by mouth 2 (two) times daily. (Patient not taking: Reported on 04/10/2017) 20 tablet 0  . losartan-hydrochlorothiazide (HYZAAR) 100-25 MG tablet Take 1 tablet by mouth daily. (Patient not taking: Reported on 04/10/2017) 90 tablet 0   No facility-administered medications prior  to visit.     Review of Systems;  Patient denies headache, fevers, malaise, unintentional weight loss, skin rash, eye pain, sinus congestion and sinus pain, sore throat, dysphagia,  hemoptysis , cough, dyspnea, wheezing, chest pain, palpitations, orthopnea, edema, abdominal pain, nausea, melena, diarrhea, constipation, flank pain, dysuria, hematuria, urinary  Frequency, nocturia, numbness, tingling, seizures,  Focal weakness, Loss of consciousness,  Tremor, insomnia, depression, anxiety, and suicidal ideation.      Objective:  BP 122/74 (BP Location: Left Arm, Patient Position: Sitting, Cuff Size: Normal)   Pulse (!) 54   Temp 98.2 F (36.8 C) (Oral)   Resp 15   Ht 5' 6.75"  (1.695 m)   Wt 234 lb 12.8 oz (106.5 kg)   SpO2 96%   BMI 37.05 kg/m   BP Readings from Last 3 Encounters:  04/10/17 122/74  08/28/16 128/76  02/27/16 110/76    Wt Readings from Last 3 Encounters:  04/10/17 234 lb 12.8 oz (106.5 kg)  08/28/16 247 lb (112 kg)  02/27/16 267 lb 12 oz (121.5 kg)    General appearance: alert, cooperative and appears stated age Ears: normal TM's and external ear canals both ears Throat: lips, mucosa, and tongue normal; teeth and gums normal Neck: no adenopathy, no carotid bruit, supple, symmetrical, trachea midline and thyroid not enlarged, symmetric, no tenderness/mass/nodules Back: symmetric, no curvature. ROM normal. No CVA tenderness. Lungs: clear to auscultation bilaterally Heart: regular rate and rhythm, S1, S2 normal, no murmur, click, rub or gallop Abdomen: soft, non-tender; bowel sounds normal; no masses,  no organomegaly Pulses: 2+ and symmetric Skin: Skin color, texture, turgor normal. No rashes or lesions Lymph nodes: Cervical, supraclavicular, and axillary nodes normal.  Lab Results  Component Value Date   HGBA1C 5.4 04/10/2017   HGBA1C 5.7 08/28/2016   HGBA1C 6.0 02/27/2016    Lab Results  Component Value Date   CREATININE 1.15 04/10/2017   CREATININE 1.20 08/28/2016   CREATININE 1.13 02/27/2016    Lab Results  Component Value Date   WBC 7.8 08/28/2015   HGB 14.0 08/28/2015   HCT 40.8 08/28/2015   PLT 206.0 08/28/2015   GLUCOSE 111 (H) 04/10/2017   CHOL 196 04/10/2017   TRIG 96.0 04/10/2017   HDL 50.90 04/10/2017   LDLDIRECT 140.0 08/28/2016   LDLCALC 126 (H) 04/10/2017   ALT 26 04/10/2017   AST 26 04/10/2017   NA 137 04/10/2017   K 4.8 04/10/2017   CL 102 04/10/2017   CREATININE 1.15 04/10/2017   BUN 28 (H) 04/10/2017   CO2 29 04/10/2017   TSH 2.37 08/28/2015   HGBA1C 5.4 04/10/2017   MICROALBUR 1.1 08/28/2016    Mm Screening Breast Tomo Bilateral  Result Date: 01/12/2017 CLINICAL DATA:  Screening.  EXAM: 2D DIGITAL SCREENING BILATERAL MAMMOGRAM WITH CAD AND ADJUNCT TOMO COMPARISON:  Previous exam(s). ACR Breast Density Category b: There are scattered areas of fibroglandular density. FINDINGS: There are no findings suspicious for malignancy. Images were processed with CAD. IMPRESSION: No mammographic evidence of malignancy. A result letter of this screening mammogram will be mailed directly to the patient. RECOMMENDATION: Screening mammogram in one year. (Code:SM-B-01Y) BI-RADS CATEGORY  1: Negative. Electronically Signed   By: Nolon Nations M.D.   On: 01/12/2017 14:05    Assessment & Plan:   Problem List Items Addressed This Visit    Vitamin D deficiency   Relevant Orders   VITAMIN D 25 Hydroxy (Vit-D Deficiency, Fractures) (Completed)   CKD (chronic kidney disease) stage 3, GFR 30-59  ml/min (HCC)   Relevant Orders   Comprehensive metabolic panel (Completed)   Familial hyperlipidemia, high LDL - Primary    Based on current lipid profile, the risk of clinically significant CAD is 11%  over the next 10 years, using the Framingham risk calculator. The [employer] recommends starting patients aged 37 or higher on moderate intensity statin therapy for LDL between 70-189 and 10 yr risk of CAD > 7.5% ;  and high intensity therapy for anyone with LDL > 190.  Statin offered   Lab Results  Component Value Date   CHOL 196 04/10/2017   HDL 50.90 04/10/2017   LDLCALC 126 (H) 04/10/2017   LDLDIRECT 140.0 08/28/2016   TRIG 96.0 04/10/2017   CHOLHDL 4 04/10/2017        Relevant Orders   Lipid panel (Completed)   Hypertension    Well controlled on current regimen. Renal function stable, no changes today.  Lab Results  Component Value Date   CREATININE 1.15 04/10/2017   Lab Results  Component Value Date   NA 137 04/10/2017   K 4.8 04/10/2017   CL 102 04/10/2017   CO2 29 04/10/2017         Obesity    I have congratulated her in reduction of   BMI and encouraged   Continued weight loss with goal of 10% of body weigh over the next 6 months using a low glycemic index diet and regular exercise a minimum of 5 days per week.        Prediabetes    a2c now 5.4 with low GI lifestyle and weight loss,   Lab Results  Component Value Date   HGBA1C 5.4 04/10/2017         Relevant Orders   Hemoglobin A1c (Completed)      I have discontinued Quintella Reichert "Val"'s aspirin, diphenhydrAMINE, amoxicillin-clavulanate, doxycycline, and losartan-hydrochlorothiazide. I am also having her start on Zoster Vaccine Adjuvanted. Additionally, I am having her maintain her latanoprost, LUTEIN PO, KRILL OIL PO, vitamin B-12, Multiple Vitamins-Minerals (CENTRUM SILVER ULTRA WOMENS PO), timolol, Vitamin D3, Magnesium Citrate, Co Q10, Turmeric Curcumin, FA-BENFOTIAMINE-PABA-E-LIPOIC PO, amLODipine, metoprolol succinate, losartan, and Multiple Vitamins-Minerals (PRESERVISION AREDS 2 PO).  Meds ordered this encounter  Medications  . Zoster Vaccine Adjuvanted [facility]) injection    Sig: Inject 0.5 mLs into the muscle once for 1 dose.    Dispense:  1 each    Refill:  1    Medications Discontinued During This Encounter  Medication Reason  . amoxicillin-clavulanate (AUGMENTIN) 875-125 MG tablet Completed Course  . aspirin 81 MG tablet Patient has not taken in last 30 days  . diphenhydrAMINE (BENADRYL) 25 mg capsule Patient has not taken in last 30 days  . doxycycline (VIBRA-TABS) 100 MG tablet Patient has not taken in last 30 days  . losartan-hydrochlorothiazide (HYZAAR) 100-25 MG tablet Change in therapy    Follow-up: Return in about 6 months (around 10/08/2017).   Crecencio Mc, MD

## 2017-04-10 NOTE — Patient Instructions (Addendum)
Taking an antibiotic can create an imbalance in the normal population of bacteria that live in the small intestine.  This imbalance can persist for 3 months.   Taking a probiotic ( Align, Floraque or Culturelle), the generic version of one of these over the counter medications, or an alternative form (kombucha,  Yogurt, or another dietary source) for a minimum of 3 weeks may help prevent a serious antibiotic associated diarrhea  Called clostridium dificile colitis that occurs when the bacteria population is altered .  Taking a probiotic may also prevent vaginitis due to yeast infections and can be continued indefinitely if you feel that it improves your digestion or your elimination (bowels).   You can stop your multivitamin and your CoQ10 .  (continue your eye vitamins)  Check with [employer] about the Shingrx vaccine coverage   I recommend getting the majority of your calcium and Vitamin D  through diet rather than supplements given the recent association of calcium supplements with increased coronary artery calcium scores  Try the soy milks that most grocery stores  now carry  in the dairy  Section>   They are cholesterol and lactose free:   Natural  Vitamin D can be sourced throug cod liver ol capsules    See you In  6 months

## 2017-04-11 ENCOUNTER — Encounter: Payer: Self-pay | Admitting: Internal Medicine

## 2017-04-11 NOTE — Assessment & Plan Note (Signed)
a2c now 5.4 with low GI lifestyle and weight loss,   Lab Results  Component Value Date   HGBA1C 5.4 04/10/2017

## 2017-04-11 NOTE — Assessment & Plan Note (Signed)
Based on current lipid profile, the risk of clinically significant CAD is 11%  over the next 10 years, using the Framingham risk calculator. The [employer] recommends starting patients aged 70 or higher on moderate intensity statin therapy for LDL between 70-189 and 10 yr risk of CAD > 7.5% ;  and high intensity therapy for anyone with LDL > 190.  Statin offered   Lab Results  Component Value Date   CHOL 196 04/10/2017   HDL 50.90 04/10/2017   LDLCALC 126 (H) 04/10/2017   LDLDIRECT 140.0 08/28/2016   TRIG 96.0 04/10/2017   CHOLHDL 4 04/10/2017

## 2017-04-11 NOTE — Assessment & Plan Note (Signed)
I have congratulated her in reduction of   BMI and encouraged  Continued weight loss with goal of 10% of body weigh over the next 6 months using a low glycemic index diet and regular exercise a minimum of 5 days per week.    

## 2017-04-11 NOTE — Assessment & Plan Note (Signed)
Well controlled on current regimen. Renal function stable, no changes today.  Lab Results  Component Value Date   CREATININE 1.15 04/10/2017   Lab Results  Component Value Date   NA 137 04/10/2017   K 4.8 04/10/2017   CL 102 04/10/2017   CO2 29 04/10/2017

## 2017-04-13 ENCOUNTER — Other Ambulatory Visit: Payer: Self-pay | Admitting: Internal Medicine

## 2017-04-13 MED ORDER — METOPROLOL SUCCINATE ER 25 MG PO TB24: 25.0000 mg | ORAL_TABLET | Freq: Every day | ORAL | 1 refills | Status: DC

## 2017-04-13 NOTE — Progress Notes (Signed)
My Chart message sent

## 2017-05-04 ENCOUNTER — Other Ambulatory Visit: Payer: Self-pay | Admitting: Internal Medicine

## 2017-05-04 ENCOUNTER — Encounter: Payer: Self-pay | Admitting: Internal Medicine

## 2017-05-05 ENCOUNTER — Other Ambulatory Visit: Payer: Self-pay | Admitting: Internal Medicine

## 2017-05-18 ENCOUNTER — Other Ambulatory Visit: Payer: Self-pay | Admitting: Internal Medicine

## 2017-05-22 ENCOUNTER — Telehealth: Payer: Self-pay

## 2017-05-22 NOTE — Telephone Encounter (Signed)
Copied from [place] [id number]. Topic: Appointment Scheduling - Scheduling Inquiry for Clinic >> May 22, 2017 11:35 AM Arletha Grippe wrote: Reason for CRM: pt would like shingles shot. She says she has Pharmacist, community through her husband.  Please call [phone]

## 2017-05-22 NOTE — Telephone Encounter (Signed)
Spoke with pt and informed her that we no longer have the vaccine in the office and that it is on back ordered. Explained to the pt that she can call around to the different pharmacies and see if they have it and if not she can be put on a waiting list at a pharmacy. Pt gave a verbal understanding.

## 2017-06-23 DIAGNOSIS — H401121 Primary open-angle glaucoma, left eye, mild stage: Secondary | ICD-10-CM | POA: Diagnosis not present

## 2017-07-01 DIAGNOSIS — H401121 Primary open-angle glaucoma, left eye, mild stage: Secondary | ICD-10-CM | POA: Diagnosis not present

## 2017-07-27 DIAGNOSIS — H6122 Impacted cerumen, left ear: Secondary | ICD-10-CM | POA: Diagnosis not present

## 2017-07-27 DIAGNOSIS — H903 Sensorineural hearing loss, bilateral: Secondary | ICD-10-CM | POA: Diagnosis not present

## 2017-09-07 NOTE — Telephone Encounter (Signed)
Pt is calling in to check in about the shingles vaccine. She would like to know if it is available

## 2017-09-08 NOTE — Telephone Encounter (Signed)
Spoke with pt and informed her that we don't give the shingles vaccine to anyone over the age of 11. Gave pt some pharmacy names that she could call and see if they have the vaccine. Pt gave a verbal understanding.

## 2017-10-08 ENCOUNTER — Ambulatory Visit: Payer: BLUE CROSS/BLUE SHIELD | Admitting: Internal Medicine

## 2017-11-19 ENCOUNTER — Encounter: Payer: Self-pay | Admitting: Internal Medicine

## 2017-11-19 ENCOUNTER — Ambulatory Visit: Payer: BLUE CROSS/BLUE SHIELD | Admitting: Internal Medicine

## 2017-11-19 VITALS — BP 112/60 | HR 62 | Temp 97.6°F | Resp 15 | Ht 66.75 in | Wt 229.4 lb

## 2017-11-19 DIAGNOSIS — I10 Essential (primary) hypertension: Secondary | ICD-10-CM | POA: Diagnosis not present

## 2017-11-19 DIAGNOSIS — R7303 Prediabetes: Secondary | ICD-10-CM

## 2017-11-19 DIAGNOSIS — E7849 Other hyperlipidemia: Secondary | ICD-10-CM

## 2017-11-19 DIAGNOSIS — E6609 Other obesity due to excess calories: Secondary | ICD-10-CM

## 2017-11-19 DIAGNOSIS — Z6836 Body mass index (BMI) 36.0-36.9, adult: Secondary | ICD-10-CM

## 2017-11-19 DIAGNOSIS — L729 Follicular cyst of the skin and subcutaneous tissue, unspecified: Secondary | ICD-10-CM | POA: Diagnosis not present

## 2017-11-19 DIAGNOSIS — N183 Chronic kidney disease, stage 3 unspecified: Secondary | ICD-10-CM

## 2017-11-19 DIAGNOSIS — M67449 Ganglion, unspecified hand: Secondary | ICD-10-CM

## 2017-11-19 LAB — COMPREHENSIVE METABOLIC PANEL
ALT: 17 U/L (ref 0–35)
AST: 19 U/L (ref 0–37)
Albumin: 3.8 g/dL (ref 3.5–5.2)
Alkaline Phosphatase: 91 U/L (ref 39–117)
BUN: 20 mg/dL (ref 6–23)
CHLORIDE: 104 meq/L (ref 96–112)
CO2: 26 mEq/L (ref 19–32)
CREATININE: 1.11 mg/dL (ref 0.40–1.20)
Calcium: 9.2 mg/dL (ref 8.4–10.5)
GFR: 51.6 mL/min — ABNORMAL LOW (ref >60.00)
GLUCOSE: 106 mg/dL — AB (ref 70–99)
POTASSIUM: 4.3 meq/L (ref 3.5–5.1)
SODIUM: 135 meq/L (ref 135–145)
Total Bilirubin: 0.8 mg/dL (ref 0.2–1.2)
Total Protein: 6.8 g/dL (ref 6.0–8.3)

## 2017-11-19 MED ORDER — AMLODIPINE BESYLATE 10 MG PO TABS
10.0000 mg | ORAL_TABLET | Freq: Every day | ORAL | 3 refills | Status: DC
Start: 2017-11-19 — End: 2018-12-10

## 2017-11-19 NOTE — Patient Instructions (Addendum)
Your weight loss continues!!   40 lbs thus far  At some point you may need LESS blood pressure medication.  Once your  BP is consistently  110/60 or less, you can reduce  the amlodipine dose to 5 mg  And recheck bp In  1 week .  We will repeat fasting labs in 6 months,  Just non fasting labs today    I will make a referral to [facility] to evaluate  the cysts on your right thumb   and a one time skin check

## 2017-11-19 NOTE — Progress Notes (Signed)
Subjective:  Patient ID: Savannah Hood, female    DOB: 1945/12/14  Age: 72 y.o. MRN: [id number]  CC: The primary encounter diagnosis was Essential hypertension. Diagnoses of Cyst of skin, CKD (chronic kidney disease) stage 3, GFR 30-59 ml/min (HCC), Prediabetes, Class 2 obesity due to excess calories without serious comorbidity with body mass index (BMI) of 36.0 to 36.9 in adult, Familial hyperlipidemia, high LDL, and Digital mucinous cyst of finger were also pertinent to this visit.  HPI Savannah Hood presents for follow up on hypertension,  Hyperlipidemia, CKD   And obesity  Has lost 5 lbs since January , 18 lbs since June 2018, 40 lbs since 2017 . Riding an exercise bike at least 5 days per week for 8 miles  Daily  .  Travelled a lot this summer.  Avoiding use of NSAIDs for pain   Hypertension: patient checks blood pressure twice weekly at home.  Readings have been for the most part <130/80  at rest . Patient is following a reduce salt diet most days and is taking medications as prescribed  No tick exposure,  No falls,.  Feels great  .  Has noticed 2 enlarging cysts on  Dorsal surfice of thumb  Not painful.  Not interfering with joint.  No recent trauma.  Does no gardening.   Had shingrx #1 of 2 [place]    Outpatient Medications Prior to Visit  Medication Sig Dispense Refill  . Cholecalciferol (VITAMIN D3) 1000 units CAPS Take 2 capsules by mouth daily.     . COD LIVER OIL PO Take 1 capsule by mouth daily.     . Coenzyme Q10 (CO Q10) 100 MG CAPS Take 1 capsule by mouth daily.    Marland Kitchen FA-BENFOTIAMINE-PABA-E-LIPOIC PO Take 1 capsule by mouth daily.    Marland Kitchen latanoprost (XALATAN) 0.005 % ophthalmic solution Place 1 drop into both eyes daily.      Marland Kitchen losartan (COZAAR) 100 MG tablet Take 1 tablet (100 mg total) by mouth daily. 90 tablet 3  . LUTEIN PO Take 1 tablet by mouth daily.      . Magnesium Citrate 100 MG TABS Take 150 mg by mouth daily.     . Misc Natural Products  (TURMERIC CURCUMIN) CAPS Take 1 capsule by mouth daily.    . Multiple Vitamins-Minerals (PRESERVISION AREDS 2 PO) Take 1 tablet by mouth daily.    . timolol (TIMOPTIC) 0.5 % ophthalmic solution INSTILL 1 DROP INTO LEFT EYE EVERY DAY  6  . vitamin B-12 (CYANOCOBALAMIN) 1000 MCG tablet Take 1,000 mcg by mouth daily.    Marland Kitchen amLODipine (NORVASC) 10 MG tablet Take 1 tablet (10 mg total) by mouth daily. 90 tablet 3  . KRILL OIL PO Take 1 capsule by mouth daily.      . Multiple Vitamins-Minerals (CENTRUM SILVER ULTRA WOMENS PO) Take by mouth.     No facility-administered medications prior to visit.     Review of Systems;  Patient denies headache, fevers, malaise, unintentional weight loss, skin rash, eye pain, sinus congestion and sinus pain, sore throat, dysphagia,  hemoptysis , cough, dyspnea, wheezing, chest pain, palpitations, orthopnea, edema, abdominal pain, nausea, melena, diarrhea, constipation, flank pain, dysuria, hematuria, urinary  Frequency, nocturia, numbness, tingling, seizures,  Focal weakness, Loss of consciousness,  Tremor, insomnia, depression, anxiety, and suicidal ideation.      Objective:  BP 112/60 (BP Location: Left Arm, Patient Position: Sitting, Cuff Size: Normal)   Pulse 62   Temp 97.6 F (36.4  C) (Oral)   Resp 15   Ht 5' 6.75" (1.695 m)   Wt 229 lb 6.4 oz (104.1 kg)   SpO2 95%   BMI 36.20 kg/m   BP Readings from Last 3 Encounters:  11/19/17 112/60  04/10/17 122/74  08/28/16 128/76    Wt Readings from Last 3 Encounters:  11/19/17 229 lb 6.4 oz (104.1 kg)  04/10/17 234 lb 12.8 oz (106.5 kg)  08/28/16 247 lb (112 kg)    General appearance: alert, cooperative and appears stated age Ears: normal TM's and external ear canals both ears Throat: lips, mucosa, and tongue normal; teeth and gums normal Neck: no adenopathy, no carotid bruit, supple, symmetrical, trachea midline and thyroid not enlarged, symmetric, no tenderness/mass/nodules Back: symmetric, no  curvature. ROM normal. No CVA tenderness. Lungs: clear to auscultation bilaterally Heart: regular rate and rhythm, S1, S2 normal, no murmur, click, rub or gallop Abdomen: soft, non-tender; bowel sounds normal; no masses,  no organomegaly Pulses: 2+ and symmetric Skin: Skin color, texture, turgor normal. No rashes or lesions Lymph nodes: Cervical, supraclavicular, and axillary nodes normal.  Lab Results  Component Value Date   HGBA1C 5.4 04/10/2017   HGBA1C 5.7 08/28/2016   HGBA1C 6.0 02/27/2016    Lab Results  Component Value Date   CREATININE 1.11 11/19/2017   CREATININE 1.15 04/10/2017   CREATININE 1.20 08/28/2016    Lab Results  Component Value Date   WBC 7.8 08/28/2015   HGB 14.0 08/28/2015   HCT 40.8 08/28/2015   PLT 206.0 08/28/2015   GLUCOSE 106 (H) 11/19/2017   CHOL 196 04/10/2017   TRIG 96.0 04/10/2017   HDL 50.90 04/10/2017   LDLDIRECT 140.0 08/28/2016   LDLCALC 126 (H) 04/10/2017   ALT 17 11/19/2017   AST 19 11/19/2017   NA 135 11/19/2017   K 4.3 11/19/2017   CL 104 11/19/2017   CREATININE 1.11 11/19/2017   BUN 20 11/19/2017   CO2 26 11/19/2017   TSH 2.37 08/28/2015   HGBA1C 5.4 04/10/2017   MICROALBUR 1.1 08/28/2016    Mm Screening Breast Tomo Bilateral  Result Date: 01/12/2017 CLINICAL DATA:  Screening. EXAM: 2D DIGITAL SCREENING BILATERAL MAMMOGRAM WITH CAD AND ADJUNCT TOMO COMPARISON:  Previous exam(s). ACR Breast Density Category b: There are scattered areas of fibroglandular density. FINDINGS: There are no findings suspicious for malignancy. Images were processed with CAD. IMPRESSION: No mammographic evidence of malignancy. A result letter of this screening mammogram will be mailed directly to the patient. RECOMMENDATION: Screening mammogram in one year. (Code:SM-B-01Y) BI-RADS CATEGORY  1: Negative. Electronically Signed   By: Nolon Nations M.D.   On: 01/12/2017 14:05    Assessment & Plan:   Problem List Items Addressed This Visit    CKD  (chronic kidney disease) stage 3, GFR 30-59 ml/min (HCC)    Renal function is at  baseline with avoidance of NSAIDs.  She is on an ARB for control of hypertension,  Lab Results  Component Value Date   CREATININE 1.11 11/19/2017   Lab Results  Component Value Date   NA 135 11/19/2017   K 4.3 11/19/2017   CL 104 11/19/2017   CO2 26 11/19/2017         Digital mucinous cyst of finger    Presumed,  Etiology unkonw.  Not affecting joint.  Referring to dermatology for evaluation       Familial hyperlipidemia, high LDL    Based on current lipid profile, the risk of clinically significant CAD is 11%  over the next 10 years, using the Framingham risk calculator. The [employer] recommends starting patients aged 28 or higher on moderate intensity statin therapy for LDL between 70-189 and 10 yr risk of CAD > 7.5% ;  and high intensity therapy for anyone with LDL > 190.  Statin offered but declined.    Lab Results  Component Value Date   CHOL 196 04/10/2017   HDL 50.90 04/10/2017   LDLCALC 126 (H) 04/10/2017   LDLDIRECT 140.0 08/28/2016   TRIG 96.0 04/10/2017   CHOLHDL 4 04/10/2017        Relevant Medications   amLODipine (NORVASC) 10 MG tablet   Hypertension - Primary    Well controlled on current regimen. Renal function stable, no changes today.  Lab Results  Component Value Date   CREATININE 1.11 11/19/2017   Lab Results  Component Value Date   NA 135 11/19/2017   K 4.3 11/19/2017   CL 104 11/19/2017   CO2 26 11/19/2017         Relevant Medications   amLODipine (NORVASC) 10 MG tablet   Other Relevant Orders   Comprehensive metabolic panel (Completed)   Obesity    I have congratulated her in reduction of   BMI and encouraged  Continued weight loss with goal of 10% of body weigh over the next 6 months using a low glycemic index diet and regular exercise a minimum of 5 days per week.        Prediabetes    a2c now 5.4 with low GI lifestyle and  weight loss,  Will repeat annually  Lab Results  Component Value Date   HGBA1C 5.4 04/10/2017          Other Visit Diagnoses    Cyst of skin       Relevant Orders   Ambulatory referral to Dermatology    A total of 25 minutes of face to face time was spent with patient more than half of which was spent in counselling about the above mentioned conditions  and coordination of care   I have discontinued Enzo Montgomery. Stagner "Val"'s KRILL OIL PO and Multiple Vitamins-Minerals (CENTRUM SILVER ULTRA WOMENS PO). I am also having her maintain her latanoprost, LUTEIN PO, vitamin B-12, timolol, Vitamin D3, Magnesium Citrate, Co Q10, Turmeric Curcumin, FA-BENFOTIAMINE-PABA-E-LIPOIC PO, losartan, Multiple Vitamins-Minerals (PRESERVISION AREDS 2 PO), COD LIVER OIL PO, and amLODipine.  Meds ordered this encounter  Medications  . amLODipine (NORVASC) 10 MG tablet    Sig: Take 1 tablet (10 mg total) by mouth daily.    Dispense:  90 tablet    Refill:  3    Medications Discontinued During This Encounter  Medication Reason  . KRILL OIL PO Patient has not taken in last 30 days  . Multiple Vitamins-Minerals (CENTRUM SILVER ULTRA WOMENS PO) Patient has not taken in last 30 days  . amLODipine (NORVASC) 10 MG tablet Reorder    Follow-up: Return in about 6 months (around 05/22/2018) for CPE.   Crecencio Mc, MD

## 2017-11-21 ENCOUNTER — Encounter: Payer: Self-pay | Admitting: Internal Medicine

## 2017-11-21 DIAGNOSIS — M67449 Ganglion, unspecified hand: Secondary | ICD-10-CM | POA: Insufficient documentation

## 2017-11-21 NOTE — Assessment & Plan Note (Signed)
Presumed,  Etiology unkonw.  Not affecting joint.  Referring to dermatology for evaluation

## 2017-11-21 NOTE — Assessment & Plan Note (Signed)
Renal function is at  baseline with avoidance of NSAIDs.  She is on an ARB for control of hypertension,  Lab Results  Component Value Date   CREATININE 1.11 11/19/2017   Lab Results  Component Value Date   NA 135 11/19/2017   K 4.3 11/19/2017   CL 104 11/19/2017   CO2 26 11/19/2017

## 2017-11-21 NOTE — Assessment & Plan Note (Signed)
Based on current lipid profile, the risk of clinically significant CAD is 11%  over the next 10 years, using the Framingham risk calculator. The [employer] recommends starting patients aged 70 or higher on moderate intensity statin therapy for LDL between 70-189 and 10 yr risk of CAD > 7.5% ;  and high intensity therapy for anyone with LDL > 190.  Statin offered but declined.    Lab Results  Component Value Date   CHOL 196 04/10/2017   HDL 50.90 04/10/2017   LDLCALC 126 (H) 04/10/2017   LDLDIRECT 140.0 08/28/2016   TRIG 96.0 04/10/2017   CHOLHDL 4 04/10/2017

## 2017-11-21 NOTE — Assessment & Plan Note (Signed)
Well controlled on current regimen. Renal function stable, no changes today.  Lab Results  Component Value Date   CREATININE 1.11 11/19/2017   Lab Results  Component Value Date   NA 135 11/19/2017   K 4.3 11/19/2017   CL 104 11/19/2017   CO2 26 11/19/2017

## 2017-11-21 NOTE — Assessment & Plan Note (Signed)
a2c now 5.4 with low GI lifestyle and weight loss,  Will repeat annually  Lab Results  Component Value Date   HGBA1C 5.4 04/10/2017

## 2017-11-21 NOTE — Assessment & Plan Note (Signed)
I have congratulated her in reduction of   BMI and encouraged  Continued weight loss with goal of 10% of body weigh over the next 6 months using a low glycemic index diet and regular exercise a minimum of 5 days per week.    

## 2017-12-04 ENCOUNTER — Other Ambulatory Visit: Payer: Self-pay | Admitting: Internal Medicine

## 2017-12-30 DIAGNOSIS — H401131 Primary open-angle glaucoma, bilateral, mild stage: Secondary | ICD-10-CM | POA: Diagnosis not present

## 2017-12-31 ENCOUNTER — Other Ambulatory Visit: Payer: Self-pay | Admitting: Internal Medicine

## 2017-12-31 DIAGNOSIS — Z1231 Encounter for screening mammogram for malignant neoplasm of breast: Secondary | ICD-10-CM

## 2018-01-21 ENCOUNTER — Ambulatory Visit
Admission: RE | Admit: 2018-01-21 | Discharge: 2018-01-21 | Disposition: A | Discharge status: Home / Self Care | Payer: BLUE CROSS/BLUE SHIELD | Source: Ambulatory Visit | Attending: Internal Medicine | Admitting: Internal Medicine

## 2018-01-21 DIAGNOSIS — Z1231 Encounter for screening mammogram for malignant neoplasm of breast: Secondary | ICD-10-CM | POA: Diagnosis not present

## 2018-02-08 DIAGNOSIS — D2261 Melanocytic nevi of right upper limb, including shoulder: Secondary | ICD-10-CM | POA: Diagnosis not present

## 2018-02-08 DIAGNOSIS — D2262 Melanocytic nevi of left upper limb, including shoulder: Secondary | ICD-10-CM | POA: Diagnosis not present

## 2018-02-08 DIAGNOSIS — D485 Neoplasm of uncertain behavior of skin: Secondary | ICD-10-CM | POA: Diagnosis not present

## 2018-02-08 DIAGNOSIS — D2271 Melanocytic nevi of right lower limb, including hip: Secondary | ICD-10-CM | POA: Diagnosis not present

## 2018-05-24 ENCOUNTER — Encounter: Payer: BLUE CROSS/BLUE SHIELD | Admitting: Internal Medicine

## 2018-06-02 ENCOUNTER — Other Ambulatory Visit: Payer: Self-pay | Admitting: Internal Medicine

## 2018-07-09 ENCOUNTER — Encounter: Payer: BLUE CROSS/BLUE SHIELD | Admitting: Internal Medicine

## 2018-09-16 ENCOUNTER — Encounter: Payer: BLUE CROSS/BLUE SHIELD | Admitting: Internal Medicine

## 2018-11-17 ENCOUNTER — Other Ambulatory Visit: Payer: Self-pay

## 2018-11-19 ENCOUNTER — Other Ambulatory Visit: Payer: Self-pay

## 2018-11-19 ENCOUNTER — Ambulatory Visit (INDEPENDENT_AMBULATORY_CARE_PROVIDER_SITE_OTHER): Payer: BC Managed Care – PPO | Admitting: Internal Medicine

## 2018-11-19 ENCOUNTER — Encounter: Payer: Self-pay | Admitting: Internal Medicine

## 2018-11-19 VITALS — BP 116/74 | HR 71 | Temp 97.7°F | Resp 15 | Ht 66.75 in | Wt 232.8 lb

## 2018-11-19 DIAGNOSIS — Z23 Encounter for immunization: Secondary | ICD-10-CM

## 2018-11-19 DIAGNOSIS — I10 Essential (primary) hypertension: Secondary | ICD-10-CM

## 2018-11-19 DIAGNOSIS — E7849 Other hyperlipidemia: Secondary | ICD-10-CM | POA: Diagnosis not present

## 2018-11-19 DIAGNOSIS — R7303 Prediabetes: Secondary | ICD-10-CM | POA: Diagnosis not present

## 2018-11-19 DIAGNOSIS — N183 Chronic kidney disease, stage 3 unspecified: Secondary | ICD-10-CM

## 2018-11-19 DIAGNOSIS — D126 Benign neoplasm of colon, unspecified: Secondary | ICD-10-CM | POA: Insufficient documentation

## 2018-11-19 DIAGNOSIS — Z1231 Encounter for screening mammogram for malignant neoplasm of breast: Secondary | ICD-10-CM

## 2018-11-19 DIAGNOSIS — R5383 Other fatigue: Secondary | ICD-10-CM

## 2018-11-19 DIAGNOSIS — Z0000 Encounter for general adult medical examination without abnormal findings: Secondary | ICD-10-CM | POA: Diagnosis not present

## 2018-11-19 LAB — COMPREHENSIVE METABOLIC PANEL
ALT: 24 U/L (ref 0–35)
AST: 22 U/L (ref 0–37)
Albumin: 3.8 g/dL (ref 3.5–5.2)
Alkaline Phosphatase: 99 U/L (ref 39–117)
BUN: 27 mg/dL — ABNORMAL HIGH (ref 6–23)
CO2: 25 mEq/L (ref 19–32)
Calcium: 9.1 mg/dL (ref 8.4–10.5)
Chloride: 103 mEq/L (ref 96–112)
Creatinine, Ser: 1.03 mg/dL (ref 0.40–1.20)
GFR: 52.77 mL/min — ABNORMAL LOW (ref >60.00)
Glucose, Bld: 96 mg/dL (ref 70–99)
Potassium: 4.2 mEq/L (ref 3.5–5.1)
Sodium: 135 mEq/L (ref 135–145)
Total Bilirubin: 0.6 mg/dL (ref 0.2–1.2)
Total Protein: 6.7 g/dL (ref 6.0–8.3)

## 2018-11-19 LAB — LIPID PANEL
Cholesterol: 223 mg/dL — ABNORMAL HIGH (ref 0–200)
HDL: 49 mg/dL (ref >39.00)
LDL Cholesterol: 148 mg/dL — ABNORMAL HIGH (ref 0–99)
NonHDL: 173.76
Total CHOL/HDL Ratio: 5
Triglycerides: 129 mg/dL (ref 0.0–149.0)
VLDL: 25.8 mg/dL (ref 0.0–40.0)

## 2018-11-19 LAB — HEMOGLOBIN A1C: Hgb A1c MFr Bld: 5.4 % (ref 4.6–6.5)

## 2018-11-19 LAB — CBC WITH DIFFERENTIAL/PLATELET
Basophils Absolute: 0 10*3/uL (ref 0.0–0.1)
Basophils Relative: 0.6 % (ref 0.0–3.0)
Eosinophils Absolute: 0.1 10*3/uL (ref 0.0–0.7)
Eosinophils Relative: 2.3 % (ref 0.0–5.0)
HCT: 39.1 % (ref 36.0–46.0)
Hemoglobin: 13.5 g/dL (ref 12.0–15.0)
Lymphocytes Relative: 21.5 % (ref 12.0–46.0)
Lymphs Abs: 1.3 10*3/uL (ref 0.7–4.0)
MCHC: 34.6 g/dL (ref 30.0–36.0)
MCV: 92 fl (ref 78.0–100.0)
Monocytes Absolute: 0.5 10*3/uL (ref 0.1–1.0)
Monocytes Relative: 8.2 % (ref 3.0–12.0)
Neutro Abs: 4.1 10*3/uL (ref 1.4–7.7)
Neutrophils Relative %: 67.4 % (ref 43.0–77.0)
Platelets: 169 10*3/uL (ref 150.0–400.0)
RBC: 4.25 Mil/uL (ref 3.87–5.11)
RDW: 12.4 % (ref 11.5–15.5)
WBC: 6 10*3/uL (ref 4.0–10.5)

## 2018-11-19 LAB — TSH: TSH: 2.04 u[IU]/mL (ref 0.35–4.50)

## 2018-11-19 NOTE — Assessment & Plan Note (Signed)
fro 2018 colonoscopy (path report in chart under Media) done by [facility].  5 yr follow up I spresumed

## 2018-11-19 NOTE — Patient Instructions (Addendum)
[employer]  Is an application available on  I phones for relaxation  Your annual mammogram has been ordered and is due in October.  You are encouraged (required) to call to make your appointment at [facility]  Your next colonoscopy appears to be due in 2023 by Dr. Earlean Shawl   Health Maintenance After Age 71 After age 59, you are at a higher risk for certain long-term diseases and infections as well as injuries from falls. Falls are a major cause of broken bones and head injuries in people who are older than age 65. Getting regular preventive care can help to keep you healthy and well. Preventive care includes getting regular testing and making lifestyle changes as recommended by your health care provider. Talk with your health care provider about:  Which screenings and tests you should have. A screening is a test that checks for a disease when you have no symptoms.  A diet and exercise plan that is right for you. What should I know about screenings and tests to prevent falls? Screening and testing are the best ways to find a health problem early. Early diagnosis and treatment give you the best chance of managing medical conditions that are common after age 63. Certain conditions and lifestyle choices may make you more likely to have a fall. Your health care provider may recommend:  Regular vision checks. Poor vision and conditions such as cataracts can make you more likely to have a fall. If you wear glasses, make sure to get your prescription updated if your vision changes.  Medicine review. Work with your health care provider to regularly review all of the medicines you are taking, including over-the-counter medicines. Ask your health care provider about any side effects that may make you more likely to have a fall. Tell your health care provider if any medicines that you take make you feel dizzy or sleepy.  Osteoporosis screening. Osteoporosis is a condition that causes the bones to  get weaker. This can make the bones weak and cause them to break more easily.  Blood pressure screening. Blood pressure changes and medicines to control blood pressure can make you feel dizzy.  Strength and balance checks. Your health care provider may recommend certain tests to check your strength and balance while standing, walking, or changing positions.  Foot health exam. Foot pain and numbness, as well as not wearing proper footwear, can make you more likely to have a fall.  Depression screening. You may be more likely to have a fall if you have a fear of falling, feel emotionally low, or feel unable to do activities that you used to do.  Alcohol use screening. Using too much alcohol can affect your balance and may make you more likely to have a fall. What actions can I take to lower my risk of falls? General instructions  Talk with your health care provider about your risks for falling. Tell your health care provider if: ? You fall. Be sure to tell your health care provider about all falls, even ones that seem minor. ? You feel dizzy, sleepy, or off-balance.  Take over-the-counter and prescription medicines only as told by your health care provider. These include any supplements.  Eat a healthy diet and maintain a healthy weight. A healthy diet includes low-fat dairy products, low-fat (lean) meats, and fiber from whole grains, beans, and lots of fruits and vegetables. Home safety  Remove any tripping hazards, such as rugs, cords, and clutter.  Install safety equipment  such as grab bars in bathrooms and safety rails on stairs.  Keep rooms and walkways well-lit. Activity   Follow a regular exercise program to stay fit. This will help you maintain your balance. Ask your health care provider what types of exercise are appropriate for you.  If you need a cane or walker, use it as recommended by your health care provider.  Wear supportive shoes that have nonskid  soles. Lifestyle  Do not drink alcohol if your health care provider tells you not to drink.  If you drink alcohol, limit how much you have: ? 0-1 drink a day for women. ? 0-2 drinks a day for men.  Be aware of how much alcohol is in your drink. In the [place] one drink equals one typical bottle of beer (12 oz), one-half glass of wine (5 oz), or one shot of hard liquor (1 oz).  Do not use any products that contain nicotine or tobacco, such as cigarettes and e-cigarettes. If you need help quitting, ask your health care provider. Summary  Having a healthy lifestyle and getting preventive care can help to protect your health and wellness after age 30.  Screening and testing are the best way to find a health problem early and help you avoid having a fall. Early diagnosis and treatment give you the best chance for managing medical conditions that are more common for people who are older than age 45.  Falls are a major cause of broken bones and head injuries in people who are older than age 68. Take precautions to prevent a fall at home.  Work with your health care provider to learn what changes you can make to improve your health and wellness and to prevent falls. This information is not intended to replace advice given to you by your health care provider. Make sure you discuss any questions you have with your health care provider. Document Released: 01/21/2017 Document Revised: 07/01/2018 Document Reviewed: 01/21/2017 Elsevier Patient Education  2020 [employer].

## 2018-11-19 NOTE — Progress Notes (Signed)
Patient ID: Savannah Hood, female    DOB: October 23, 1945  Age: 73 y.o. MRN: [id number]  The patient is here for annual preventive  examination and management of other chronic and acute problems.   mammo due oct 2020 dexa normal at [facility] in  2011 Colon done by Medoff in  2018,  Due  Again 2023? Eye exam every October with 6 month f/u for glaucoma Exercising regular using a stationery bike 5 x 40 minutes  Intermittent pain left knee aggravated by biking,  Not daily or even weekly .  Does not see ortho .      The risk factors are reflected in the social history.  The roster of all physicians providing medical care to patient - is listed in the Snapshot section of the chart.  Activities of daily living:  The patient is 100% independent in all ADLs: dressing, toileting, feeding as well as independent mobility  Home safety : The patient has smoke detectors in the home. They wear seatbelts.  There are no firearms at home. There is no violence in the home.   There is no risks for hepatitis, STDs or HIV. There is no   history of blood transfusion. They have no travel history to infectious disease endemic areas of the world.  The patient has seen their dentist in the last six month. They have seen their eye doctor in the last year. They admit to slight hearing difficulty with regard to whispered voices and some television programs.  They have deferred audiologic testing in the last year.  They do not  have excessive sun exposure. Discussed the need for sun protection: hats, long sleeves and use of sunscreen if there is significant sun exposure.   Diet: the importance of a healthy diet is discussed. They do have a healthy diet.  The benefits of regular aerobic exercise were discussed. She walks 4 times per week ,  20 minutes.   Depression screen: there are no signs or vegative symptoms of depression- irritability, change in appetite, anhedonia, sadness/tearfullness.  Cognitive  assessment: the patient manages all their financial and personal affairs and is actively engaged. They could relate day,date,year and events; recalled 2/3 objects at 3 minutes; performed clock-face test normally.  The following portions of the patient's history were reviewed and updated as appropriate: allergies, current medications, past family history, past medical history,  past surgical history, past social history  and problem list.  Visual acuity was not assessed per patient preference since she has regular follow up with her ophthalmologist. Hearing and body mass index were assessed and reviewed.   During the course of the visit the patient was educated and counseled about appropriate screening and preventive services including : fall prevention , diabetes screening, nutrition counseling, colorectal cancer screening, and recommended immunizations.    CC: The primary encounter diagnosis was Fatigue, unspecified type. Diagnoses of Familial hyperlipidemia, high LDL, Prediabetes, Tubular adenoma of colon, Need for immunization against influenza, Routine general medical examination at a health care facility, Essential hypertension, and CKD (chronic kidney disease) stage 3, GFR 30-59 ml/min (HCC) were also pertinent to this visit.  Patient is taking her medications as prescribed and notes no adverse effects.  Home BP readings have been done about once per week and are  generally < 120 /70  .  She is avoiding added salt in her diet and biking  regularly 5 times per week for exercise.  Occasional insomnia : managed without meds.  Did not  tolerate  melatonin    History Savannah Hood has a past medical history of Arthritis, Colon polyps, Diabetes mellitus, Glaucoma, Hyperlipidemia, Hypertension, and Obesity.   She has a past surgical history that includes Abdominal hysterectomy.   Her family history includes Arthritis in her mother and sister; Cancer in her father; Diabetes in her father and paternal  grandmother.She reports that she has never smoked. She has never used smokeless tobacco. She reports current alcohol use. She reports that she does not use drugs.  Outpatient Medications Prior to Visit  Medication Sig Dispense Refill  . amLODipine (NORVASC) 10 MG tablet Take 1 tablet (10 mg total) by mouth daily. 90 tablet 3  . Cholecalciferol (VITAMIN D3) 1000 units CAPS Take 2 capsules by mouth daily.     . COD LIVER OIL PO Take 1 capsule by mouth daily.     Marland Kitchen FA-BENFOTIAMINE-PABA-E-LIPOIC PO Take 1 capsule by mouth daily.    Marland Kitchen latanoprost (XALATAN) 0.005 % ophthalmic solution Place 1 drop into both eyes daily.      Marland Kitchen losartan (COZAAR) 100 MG tablet TAKE ONE TABLET EVERY DAY 90 tablet 1  . LUTEIN PO Take 1 tablet by mouth daily.      . Misc Natural Products (TURMERIC CURCUMIN) CAPS Take 1 capsule by mouth daily.    . Multiple Vitamins-Minerals (PRESERVISION AREDS 2 PO) Take 1 tablet by mouth daily.    . timolol (TIMOPTIC) 0.5 % ophthalmic solution INSTILL 1 DROP INTO LEFT EYE EVERY DAY  6  . vitamin B-12 (CYANOCOBALAMIN) 1000 MCG tablet Take 1,000 mcg by mouth daily.    . Coenzyme Q10 (CO Q10) 100 MG CAPS Take 1 capsule by mouth daily.    . Magnesium Citrate 100 MG TABS Take 150 mg by mouth daily.      No facility-administered medications prior to visit.     Review of Systems   Patient denies headache, fevers, malaise, unintentional weight loss, skin rash, eye pain, sinus congestion and sinus pain, sore throat, dysphagia,  hemoptysis , cough, dyspnea, wheezing, chest pain, palpitations, orthopnea, edema, abdominal pain, nausea, melena, diarrhea, constipation, flank pain, dysuria, hematuria, urinary  Frequency, nocturia, numbness, tingling, seizures,  Focal weakness, Loss of consciousness,  Tremor, insomnia, depression, anxiety, and suicidal ideation.     Objective:  BP 116/74 (BP Location: Left Arm, Patient Position: Sitting, Cuff Size: Normal)   Pulse 71   Temp 97.7 F (36.5 C)  (Temporal)   Resp 15   Ht 5' 6.75" (1.695 m)   Wt 232 lb 12.8 oz (105.6 kg)   SpO2 97%   BMI 36.74 kg/m   Physical Exam   General appearance: alert, cooperative and appears stated age Head: Normocephalic, without obvious abnormality, atraumatic Eyes: conjunctivae/corneas clear. PERRL, EOM's intact. Fundi benign. Ears: normal TM's and external ear canals both ears Nose: Nares normal. Septum midline. Mucosa normal. No drainage or sinus tenderness. Throat: lips, mucosa, and tongue normal; teeth and gums normal Neck: no adenopathy, no carotid bruit, no JVD, supple, symmetrical, trachea midline and thyroid not enlarged, symmetric, no tenderness/mass/nodules Lungs: clear to auscultation bilaterally Breasts: normal appearance, no masses or tenderness Heart: regular rate and rhythm, S1, S2 normal, no murmur, click, rub or gallop Abdomen: soft, non-tender; bowel sounds normal; no masses,  no organomegaly Extremities: extremities normal, atraumatic, no cyanosis or edema Pulses: 2+ and symmetric Skin: Skin color, texture, turgor normal. No rashes or lesions Neurologic: Alert and oriented X 3, normal strength and tone. Normal symmetric reflexes. Normal coordination and gait.  Assessment & Plan:   Problem List Items Addressed This Visit      Unprioritized   Prediabetes   Relevant Orders   Comprehensive metabolic panel (Completed)   Hemoglobin A1c (Completed)   Tubular adenoma of colon    fro 2018 colonoscopy (path report in chart under Media) done by [facility].  5 yr follow up I spresumed       Hypertension    Well controlled on current regimen. Renal function stable, no changes today.  Lab Results  Component Value Date   CREATININE 1.03 11/19/2018   Lab Results  Component Value Date   NA 135 11/19/2018   K 4.2 11/19/2018   CL 103 11/19/2018   CO2 25 11/19/2018         Familial hyperlipidemia, high LDL    Based on current lipid profile, the risk of clinically  significant CAD is 12%  over the next 10 years, using the Framingham risk calculator. The [employer] recommends starting patients aged 68 or higher on moderate intensity statin therapy for LDL between 70-189 and 10 yr risk of CAD > 7.5% ; statin will be offered   Lab Results  Component Value Date   CHOL 223 (H) 11/19/2018   HDL 49.00 11/19/2018   LDLCALC 148 (H) 11/19/2018   LDLDIRECT 140.0 08/28/2016   TRIG 129.0 11/19/2018   CHOLHDL 5 11/19/2018        Relevant Orders   Lipid panel (Completed)   CKD (chronic kidney disease) stage 3, GFR 30-59 ml/min (HCC)    Renal function is at  baseline with avoidance of NSAIDs.  She is on an ARB for control of hypertension,  Lab Results  Component Value Date   CREATININE 1.03 11/19/2018   Lab Results  Component Value Date   NA 135 11/19/2018   K 4.2 11/19/2018   CL 103 11/19/2018   CO2 25 11/19/2018         Routine general medical examination at a health care facility    age appropriate education and counseling updated, referrals for preventative services and immunizations addressed, dietary and smoking counseling addressed, most recent labs reviewed.  I have personally reviewed and have noted:  1) the patient's medical and social history 2) The pt's use of alcohol, tobacco, and illicit drugs 3) The patient's current medications and supplements 4) Functional ability including ADL's, fall risk, home safety risk, hearing and visual impairment 5) Diet and physical activities 6) Evidence for depression or mood disorder 7) The patient's height, weight, and BMI have been recorded in the chart  I have made referrals, and provided counseling and education based on review of the above       Other Visit Diagnoses    Fatigue, unspecified type    -  Primary   Relevant Orders   TSH (Completed)   CBC with Differential/Platelet (Completed)   Need for immunization against influenza       Relevant Orders   Flu Vaccine  QUAD High Dose(Fluad) (Completed)      I have discontinued Quintella Reichert "Val"'s Magnesium Citrate and Co Q10. I am also having her maintain her latanoprost, LUTEIN PO, vitamin B-12, timolol, Vitamin D3, Turmeric Curcumin, FA-BENFOTIAMINE-PABA-E-LIPOIC PO, Multiple Vitamins-Minerals (PRESERVISION AREDS 2 PO), COD LIVER OIL PO, amLODipine, and losartan.  No orders of the defined types were placed in this encounter.   Medications Discontinued During This Encounter  Medication Reason  . Magnesium Citrate 100 MG TABS Patient Preference  . Coenzyme Q10 (  CO Q10) 100 MG CAPS Patient Preference    Follow-up: No follow-ups on file.   Crecencio Mc, MD

## 2018-11-21 NOTE — Assessment & Plan Note (Signed)
Well controlled on current regimen. Renal function stable, no changes today.  Lab Results  Component Value Date   CREATININE 1.03 11/19/2018   Lab Results  Component Value Date   NA 135 11/19/2018   K 4.2 11/19/2018   CL 103 11/19/2018   CO2 25 11/19/2018

## 2018-11-21 NOTE — Assessment & Plan Note (Signed)
Based on current lipid profile, the risk of clinically significant CAD is 12%  over the next 10 years, using the Framingham risk calculator. The [employer] recommends starting patients aged 71 or higher on moderate intensity statin therapy for LDL between 70-189 and 10 yr risk of CAD > 7.5% ; statin will be offered   Lab Results  Component Value Date   CHOL 223 (H) 11/19/2018   HDL 49.00 11/19/2018   LDLCALC 148 (H) 11/19/2018   LDLDIRECT 140.0 08/28/2016   TRIG 129.0 11/19/2018   CHOLHDL 5 11/19/2018

## 2018-11-21 NOTE — Assessment & Plan Note (Signed)
Renal function is at  baseline with avoidance of NSAIDs.  She is on an ARB for control of hypertension,  Lab Results  Component Value Date   CREATININE 1.03 11/19/2018   Lab Results  Component Value Date   NA 135 11/19/2018   K 4.2 11/19/2018   CL 103 11/19/2018   CO2 25 11/19/2018

## 2018-11-21 NOTE — Assessment & Plan Note (Signed)

## 2018-11-23 NOTE — Telephone Encounter (Signed)
Printed and placed in results folder 

## 2018-12-03 DIAGNOSIS — H401131 Primary open-angle glaucoma, bilateral, mild stage: Secondary | ICD-10-CM | POA: Diagnosis not present

## 2018-12-10 ENCOUNTER — Other Ambulatory Visit: Payer: Self-pay | Admitting: Internal Medicine

## 2018-12-23 ENCOUNTER — Encounter: Payer: Self-pay | Admitting: Internal Medicine

## 2018-12-23 DIAGNOSIS — H2513 Age-related nuclear cataract, bilateral: Secondary | ICD-10-CM | POA: Diagnosis not present

## 2018-12-30 DIAGNOSIS — Z0100 Encounter for examination of eyes and vision without abnormal findings: Secondary | ICD-10-CM | POA: Diagnosis not present

## 2019-02-02 ENCOUNTER — Ambulatory Visit
Admission: RE | Admit: 2019-02-02 | Discharge: 2019-02-02 | Disposition: A | Discharge status: Home / Self Care | Payer: Medicare HMO | Source: Ambulatory Visit | Attending: Internal Medicine | Admitting: Internal Medicine

## 2019-02-02 DIAGNOSIS — Z1231 Encounter for screening mammogram for malignant neoplasm of breast: Secondary | ICD-10-CM | POA: Insufficient documentation

## 2019-02-28 ENCOUNTER — Other Ambulatory Visit: Payer: Self-pay | Admitting: Internal Medicine

## 2019-04-14 ENCOUNTER — Ambulatory Visit: Payer: Medicare HMO | Attending: Internal Medicine

## 2019-04-14 DIAGNOSIS — Z23 Encounter for immunization: Secondary | ICD-10-CM | POA: Insufficient documentation

## 2019-04-14 NOTE — Progress Notes (Signed)
   Covid-19 Vaccination Clinic  Name:  Savannah Hood    MRN: [id number] DOB: January 29, 1946  04/14/2019  [name] was observed post Covid-19 immunization for 15 minutes without incidence. She was provided with Vaccine Information Sheet and instruction to access the V-Safe system.   [name] was instructed to call 911 with any severe reactions post vaccine: Marland Kitchen Difficulty breathing  . Swelling of your face and throat  . A fast heartbeat  . A bad rash all over your body  . Dizziness and weakness    Immunizations Administered    Name Date Dose VIS Date Route   Pfizer COVID-19 Vaccine 04/14/2019  5:34 PM 0.3 mL 03/04/2019 Intramuscular   Manufacturer: [place]   Lot: [id number]   [place]: [id number]

## 2019-05-05 ENCOUNTER — Ambulatory Visit: Payer: Medicare HMO | Attending: Internal Medicine

## 2019-05-05 ENCOUNTER — Telehealth: Payer: Self-pay | Admitting: Internal Medicine

## 2019-05-05 DIAGNOSIS — Z23 Encounter for immunization: Secondary | ICD-10-CM | POA: Insufficient documentation

## 2019-05-05 NOTE — Progress Notes (Signed)
   Covid-19 Vaccination Clinic  Name:  Genova Marchbank    MRN: [id number] DOB: October 20, 1945  05/05/2019  Ms. Vanderheyden was observed post Covid-19 immunization for 15 minutes without incidence. She was provided with Vaccine Information Sheet and instruction to access the V-Safe system.   Ms. Joeckel was instructed to call 911 with any severe reactions post vaccine: Marland Kitchen Difficulty breathing  . Swelling of your face and throat  . A fast heartbeat  . A bad rash all over your body  . Dizziness and weakness    Immunizations Administered    Name Date Dose VIS Date Route   Pfizer COVID-19 Vaccine 05/05/2019  8:25 AM 0.3 mL 03/04/2019 Intramuscular   Manufacturer: [place]   Lot: [id number]   [place]: [id number]

## 2019-05-05 NOTE — Telephone Encounter (Signed)
Left message for patient to call back and schedule Medicare Annual Wellness Visit (AWV) either virtually or audio only.  Last AWV 8.3.15; please schedule at anytime with Denisa O'Brien-Blaney at [facility]

## 2019-05-11 ENCOUNTER — Ambulatory Visit (INDEPENDENT_AMBULATORY_CARE_PROVIDER_SITE_OTHER): Payer: Medicare HMO

## 2019-05-11 ENCOUNTER — Other Ambulatory Visit: Payer: Self-pay

## 2019-05-11 VITALS — Ht 66.75 in | Wt 232.0 lb

## 2019-05-11 DIAGNOSIS — Z0000 Encounter for general adult medical examination without abnormal findings: Secondary | ICD-10-CM | POA: Diagnosis not present

## 2019-05-11 NOTE — Patient Instructions (Addendum)
  Ms. Duguid , Thank you for taking time to come for your Medicare Wellness Visit. I appreciate your ongoing commitment to your health goals. Please review the following plan we discussed and let me know if I can assist you in the future.   These are the goals we discussed: Goals    . Follow up with Primary Care Provider     As needed       This is a list of the screening recommended for you and due dates:  Health Maintenance  Topic Date Due  . Mammogram  02/02/2020  . Tetanus Vaccine  01/26/2021  . Colon Cancer Screening  09/30/2026  . Flu Shot  Completed  . DEXA scan (bone density measurement)  Completed  .  Hepatitis C: One time screening is recommended by Center for Disease Control  (CDC) for  adults born from [birth year] through 1965.   Completed  . Pneumonia vaccines  Completed

## 2019-05-11 NOTE — Progress Notes (Addendum)
Subjective:   Savannah Hood is a 74 y.o. female who presents for Medicare Annual (Subsequent) preventive examination.  Review of Systems:  No ROS.  Medicare Wellness Virtual Visit.  Visual/audio telehealth visit, UTA vital signs.  Ht/Wt provided.   See social history for additional risk factors.   Cardiac Risk Factors include: advanced age (>32men, >63 women);hypertension     Objective:     Vitals: Ht 5' 6.75" (1.695 m)   Wt 232 lb (105.2 kg)   BMI 36.61 kg/m   Body mass index is 36.61 kg/m.  Advanced Directives 05/11/2019  Does Patient Have a Medical Advance Directive? Yes  Type of Paramedic of [place];Living will  Does patient want to make changes to medical advance directive? No - Patient declined  Copy of [place] in Chart? No - copy requested    Tobacco Social History   Tobacco Use  Smoking Status Never Smoker  Smokeless Tobacco Never Used     Counseling given: Not Answered   Clinical Intake:  Pre-visit preparation completed: Yes        Diabetes: No  How often do you need to have someone help you when you read instructions, pamphlets, or other written materials from your doctor or pharmacy?: 1 - Never  Interpreter Needed?: No     Past Medical History:  Diagnosis Date  . Arthritis   . Colon polyps   . Diabetes mellitus    pre-diabetes in past  . Glaucoma   . Hyperlipidemia   . Hypertension   . Obesity    Past Surgical History:  Procedure Laterality Date  . ABDOMINAL HYSTERECTOMY     total, secondary to uterine polyps, Dr. Rayford Halsted   Family History  Problem Relation Age of Onset  . Arthritis Mother   . Arthritis Sister   . Cancer Father        prostate  . Diabetes Father   . Diabetes Paternal Grandmother   . Breast cancer Neg Hx    Social History   Socioeconomic History  . Marital status: Married    Spouse name: Not on file  . Number of children: Not on file  . Years of  education: Not on file  . Highest education level: Not on file  Occupational History  . Not on file  Tobacco Use  . Smoking status: Never Smoker  . Smokeless tobacco: Never Used  Substance and Sexual Activity  . Alcohol use: Yes    Comment: rarely  . Drug use: No  . Sexual activity: Not on file  Other Topics Concern  . Not on file  Social History Narrative   Lives in [place] with husband, has 2 children. 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    Social Determinants of Health   Financial Resource Strain: Low Risk   . Difficulty of Paying Living Expenses: Not hard at all  Food Insecurity: No Food Insecurity  . Worried About Charity fundraiser in the Last Year: Never true  . Ran Out of Food in the Last Year: Never true  Transportation Needs: No Transportation Needs  . Lack of Transportation (Medical): No  . Lack of Transportation (Non-Medical): No  Physical Activity: Sufficiently Active  . Days of Exercise per Week: 7 days  . Minutes of Exercise per Session: 40 min  Stress: No Stress Concern Present  . Feeling of Stress : Not at all  Social Connections: Unknown  . Frequency of Communication with Friends and Family:  More than three times a week  . Frequency of Social Gatherings with Friends and Family: More than three times a week  . Attends Religious Services: Not on file  . Active Member of Clubs or Organizations: Not on file  . Attends Archivist Meetings: Not on file  . Marital Status: Married    Outpatient Encounter Medications as of 05/11/2019  Medication Sig  . amLODipine (NORVASC) 10 MG tablet TAKE ONE TABLET BY MOUTH EVERY DAY  . amLODipine (NORVASC) 10 MG tablet TAKE 1 TABLET BY MOUTH DAILY  . Cholecalciferol (VITAMIN D3) 1000 units CAPS Take 2 capsules by mouth daily.   . COD LIVER OIL PO Take 1 capsule by mouth daily.   Marland Kitchen FA-BENFOTIAMINE-PABA-E-LIPOIC PO Take 1 capsule by mouth daily.  Marland Kitchen latanoprost (XALATAN) 0.005 % ophthalmic solution Place  1 drop into both eyes daily.    Marland Kitchen losartan (COZAAR) 100 MG tablet TAKE ONE TABLET EVERY DAY  . LUTEIN PO Take 1 tablet by mouth daily.    . Misc Natural Products (TURMERIC CURCUMIN) CAPS Take 1 capsule by mouth daily.  . Multiple Vitamins-Minerals (PRESERVISION AREDS 2 PO) Take 1 tablet by mouth daily.  . timolol (TIMOPTIC) 0.5 % ophthalmic solution INSTILL 1 DROP INTO LEFT EYE EVERY DAY  . vitamin B-12 (CYANOCOBALAMIN) 1000 MCG tablet Take 1,000 mcg by mouth daily.   No facility-administered encounter medications on file as of 05/11/2019.    Activities of Daily Living In your present state of health, do you have any difficulty performing the following activities: 05/11/2019  Hearing? Y  Comment Hearing aids, bilateral  Vision? N  Difficulty concentrating or making decisions? N  Walking or climbing stairs? Y  Comment Chronic L knee pain when climbing stairs  Dressing or bathing? N  Doing errands, shopping? N  Preparing Food and eating ? N  Using the Toilet? N  In the past six months, have you accidently leaked urine? N  Do you have problems with loss of bowel control? N  Managing your Medications? N  Managing your Finances? N  Housekeeping or managing your Housekeeping? N  Some recent data might be hidden    Patient Care Team: Crecencio Mc, MD as PCP - General (Internal Medicine)    Assessment:   This is a routine wellness examination for Savannah Hood.  Nurse connected with patient 05/11/19 at  1:00 PM EST by a telephone enabled telemedicine application and verified that I am speaking with the correct person using two identifiers. Patient stated full name and DOB. Patient gave permission to continue with virtual visit. Patient's location was at home and Nurse's location was at [place] office.   Patient is alert and oriented x3. Patient denies difficulty focusing or concentrating. 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   Health Maintenance Due: See completed HM at the end of note.   Eye: Visual acuity not assessed. Virtual visit. Followed by their ophthalmologist.  Glaucoma; drops in use.   Dental: Visits every 6 months.    Hearing: Hearing aids- yes  Safety:  Patient feels safe at home- yes Patient does have smoke detectors at home- yes Patient does wear sunscreen or protective clothing when in direct sunlight - yes Patient does wear seat belt when in a moving vehicle - yes Patient drives- yes Adequate lighting in walkways free from debris- yes Grab bars and handrails used as appropriate- yes Ambulates  with an assistive device- no Cell phone on person when ambulating outside of the home- yes  Social: Alcohol intake - yes, rare     Smoking history- never   Smokers in home? none Illicit drug use? none  Medication: Taking as directed and without issues.  Self managed - yes   Covid-19: Precautions and sickness symptoms discussed. Wears mask, social distancing, hand hygiene as appropriate.   Activities of Daily Living Patient denies needing assistance with: household chores, feeding themselves, getting from bed to chair, getting to the toilet, bathing/showering, dressing, managing money, or preparing meals.   Discussed the importance of a healthy diet, water intake and the benefits of aerobic exercise.   Physical activity- stationary bike 45 minutes daily  Diet:  Regular Water: 8-10 cups  Other Providers Patient Care Team: Crecencio Mc, MD as PCP - General (Internal Medicine)  Exercise Activities and Dietary recommendations Current Exercise Habits: Home exercise routine, Type of exercise: calisthenics, Time (Minutes): 45, Intensity: Moderate  Goals    . Follow up with Primary Care Provider     As needed       Fall Risk Fall Risk  05/11/2019 11/19/2018 04/10/2017 08/28/2015 10/24/2013  Falls in the past year? 0 0 No No No  Follow  up Falls evaluation completed Falls evaluation completed - - -   Timed Get Up and Go performed: no, virtual visit  Depression Screen PHQ 2/9 Scores 05/11/2019 11/19/2018 04/10/2017 08/28/2015  PHQ - 2 Score 0 0 0 0  PHQ- 9 Score - 0 1 -     Cognitive Function     6CIT Screen 05/11/2019  What Year? 0 points  What month? 0 points  What time? 0 points  Count back from 20 0 points  Months in reverse 0 points  Repeat phrase 0 points  Total Score 0    Immunization History  Administered Date(s) Administered  . Fluad Quad(high Dose 65+) 11/19/2018  . Influenza Split 01/27/2011, 01/17/2016  . Influenza, High Dose Seasonal PF 01/09/2017, 12/22/2017  . Influenza,inj,Quad PF,6+ Mos 03/15/2013, 12/15/2013  . PFIZER SARS-COV-2 Vaccination 04/14/2019, 05/05/2019  . Pneumococcal Conjugate-13 08/28/2015  . Pneumococcal Polysaccharide-23 03/11/2013  . Tdap 01/27/2011  . Zoster 11/20/2011  . Zoster Recombinat (Shingrix) 09/09/2017, 12/02/2017   Screening Tests Health Maintenance  Topic Date Due  . MAMMOGRAM  02/02/2020  . TETANUS/TDAP  01/26/2021  . COLONOSCOPY  09/30/2026  . INFLUENZA VACCINE  Completed  . DEXA SCAN  Completed  . Hepatitis C Screening  Completed  . PNA vac Low Risk Adult  Completed      Plan:   Keep all routine maintenance appointments.   Medicare Attestation I have personally reviewed: The patient's medical and social history Their use of alcohol, tobacco or illicit drugs Their current medications and supplements The patient's functional ability including ADLs,fall risks, home safety risks, cognitive, and hearing and visual impairment Diet and physical activities Evidence for depression   I have reviewed and discussed with patient certain preventive protocols, quality metrics, and best practice recommendations.   OBrien-Blaney, Elvert Cumpton L, LPN  [id number]   I have reviewed the above information and agree with above.   Deborra Medina, MD

## 2019-05-23 DIAGNOSIS — H6123 Impacted cerumen, bilateral: Secondary | ICD-10-CM | POA: Diagnosis not present

## 2019-05-23 DIAGNOSIS — H903 Sensorineural hearing loss, bilateral: Secondary | ICD-10-CM | POA: Diagnosis not present

## 2019-07-28 DIAGNOSIS — H401131 Primary open-angle glaucoma, bilateral, mild stage: Secondary | ICD-10-CM | POA: Diagnosis not present

## 2019-08-31 ENCOUNTER — Other Ambulatory Visit: Payer: Self-pay | Admitting: Internal Medicine

## 2019-12-02 ENCOUNTER — Other Ambulatory Visit: Payer: Self-pay | Admitting: Internal Medicine

## 2019-12-05 DIAGNOSIS — H6123 Impacted cerumen, bilateral: Secondary | ICD-10-CM | POA: Diagnosis not present

## 2019-12-05 DIAGNOSIS — H6982 Other specified disorders of Eustachian tube, left ear: Secondary | ICD-10-CM | POA: Diagnosis not present

## 2019-12-05 DIAGNOSIS — H903 Sensorineural hearing loss, bilateral: Secondary | ICD-10-CM | POA: Diagnosis not present

## 2019-12-20 ENCOUNTER — Other Ambulatory Visit: Payer: Self-pay | Admitting: Internal Medicine

## 2019-12-20 DIAGNOSIS — Z1231 Encounter for screening mammogram for malignant neoplasm of breast: Secondary | ICD-10-CM

## 2020-01-31 DIAGNOSIS — H401131 Primary open-angle glaucoma, bilateral, mild stage: Secondary | ICD-10-CM | POA: Diagnosis not present

## 2020-02-02 DIAGNOSIS — H401131 Primary open-angle glaucoma, bilateral, mild stage: Secondary | ICD-10-CM | POA: Diagnosis not present

## 2020-02-07 ENCOUNTER — Ambulatory Visit
Admission: RE | Admit: 2020-02-07 | Discharge: 2020-02-07 | Disposition: A | Discharge status: Home / Self Care | Payer: Medicare HMO | Source: Ambulatory Visit | Attending: Internal Medicine | Admitting: Internal Medicine

## 2020-02-07 ENCOUNTER — Other Ambulatory Visit: Payer: Self-pay

## 2020-02-07 DIAGNOSIS — Z1231 Encounter for screening mammogram for malignant neoplasm of breast: Secondary | ICD-10-CM | POA: Diagnosis not present

## 2020-03-14 ENCOUNTER — Other Ambulatory Visit: Payer: Self-pay | Admitting: Internal Medicine

## 2020-05-11 ENCOUNTER — Ambulatory Visit (INDEPENDENT_AMBULATORY_CARE_PROVIDER_SITE_OTHER): Payer: Medicare HMO

## 2020-05-11 VITALS — Ht 66.0 in | Wt 236.0 lb

## 2020-05-11 DIAGNOSIS — Z0000 Encounter for general adult medical examination without abnormal findings: Secondary | ICD-10-CM

## 2020-05-11 NOTE — Progress Notes (Addendum)
Subjective:   Savannah Hood is a 75 y.o. female who presents for Medicare Annual (Subsequent) preventive examination.  Review of Systems    No ROS.  Medicare Wellness Virtual Visit.    Cardiac Risk Factors include: advanced age (>67men, >72 women)     Objective:    Today's Vitals   05/11/20 1136  Weight: 236 lb (107 kg)  Height: 5\' 6"  (1.676 m)   Body mass index is 38.09 kg/m.  Advanced Directives 05/11/2020 05/11/2019  Does Patient Have a Medical Advance Directive? Yes Yes  Type of Paramedic of [place];Living will [place];Living will  Does patient want to make changes to medical advance directive? No - Patient declined No - Patient declined  Copy of [place] in Chart? No - copy requested No - copy requested    Current Medications (verified) Outpatient Encounter Medications as of 05/11/2020  Medication Sig   amLODipine (NORVASC) 10 MG tablet TAKE ONE TABLET BY MOUTH EVERY DAY   amLODipine (NORVASC) 10 MG tablet TAKE ONE TABLET BY MOUTH EVERY DAY   Cholecalciferol (VITAMIN D3) 1000 units CAPS Take 2 capsules by mouth daily.    COD LIVER OIL PO Take 1 capsule by mouth daily.    FA-BENFOTIAMINE-PABA-E-LIPOIC PO Take 1 capsule by mouth daily.   latanoprost (XALATAN) 0.005 % ophthalmic solution Place 1 drop into both eyes daily.     losartan (COZAAR) 100 MG tablet TAKE 1 TABLET BY MOUTH DAILY   LUTEIN PO Take 1 tablet by mouth daily.     Misc Natural Products (TURMERIC CURCUMIN) CAPS Take 1 capsule by mouth daily.   Multiple Vitamins-Minerals (PRESERVISION AREDS 2 PO) Take 1 tablet by mouth daily.   timolol (TIMOPTIC) 0.5 % ophthalmic solution INSTILL 1 DROP INTO LEFT EYE EVERY DAY   vitamin B-12 (CYANOCOBALAMIN) 1000 MCG tablet Take 1,000 mcg by mouth daily.   No facility-administered encounter medications on file as of 05/11/2020.    Allergies (verified) Patient has no known allergies.    History: Past Medical History:  Diagnosis Date   Arthritis    Colon polyps    Diabetes mellitus    pre-diabetes in past   Glaucoma    Hyperlipidemia    Hypertension    Obesity    Past Surgical History:  Procedure Laterality Date   ABDOMINAL HYSTERECTOMY     total, secondary to uterine polyps, Dr. Rayford Halsted   Family History  Problem Relation Age of Onset   Arthritis Mother    Arthritis Sister    Cancer Father        prostate   Diabetes Father    Diabetes Paternal Grandmother    Breast cancer Neg Hx    Social History   Socioeconomic History   Marital status: Married    Spouse name: Not on file   Number of children: Not on file   Years of education: Not on file   Highest education level: Not on file  Occupational History   Not on file  Tobacco Use   Smoking status: Never Smoker   Smokeless tobacco: Never Used  Substance and Sexual Activity   Alcohol use: Yes    Comment: rarely   Drug use: No   Sexual activity: Not on file  Other Topics Concern   Not on file  Social History Narrative   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    Social Determinants of Health  Financial Resource Strain: Low Risk    Difficulty of Paying Living Expenses: Not hard at all  Food Insecurity: No Food Insecurity   Worried About Charity fundraiser in the Last Year: Never true   Ran Out of Food in the Last Year: Never true  Transportation Needs: No Transportation Needs   Lack of Transportation (Medical): No   Lack of Transportation (Non-Medical): No  Physical Activity: Sufficiently Active   Days of Exercise per Week: 7 days   Minutes of Exercise per Session: 40 Hood  Stress: No Stress Concern Present   Feeling of Stress : Not at all  Social Connections: Unknown   Frequency of Communication with Friends and Family: More than three times a week   Frequency of Social Gatherings with Friends and Family: More than three times a  week   Attends Religious Services: Not on Electrical engineer or Organizations: Not on file   Attends Archivist Meetings: Not on file   Marital Status: Married    Tobacco Counseling Counseling given: Not Answered   Clinical Intake:  Pre-visit preparation completed: Yes        Diabetes: No  How often do you need to have someone help you when you read instructions, pamphlets, or other written materials from your doctor or pharmacy?: 1 - Never    Interpreter Needed?: No      Activities of Daily Living In your present state of health, do you have any difficulty performing the following activities: 05/11/2020  Hearing? Y  Comment Hearing aids  Vision? N  Difficulty concentrating or making decisions? N  Walking or climbing stairs? N  Dressing or bathing? N  Doing errands, shopping? N  Preparing Food and eating ? N  Using the Toilet? N  In the past six months, have you accidently leaked urine? N  Do you have problems with loss of bowel control? N  Managing your Medications? N  Managing your Finances? N  Housekeeping or managing your Housekeeping? N  Some recent data might be hidden    Patient Care Team: Crecencio Mc, MD as PCP - General (Internal Medicine)  Indicate any recent Medical Services you may have received from other than [facility] providers in the past year (date may be approximate).     Assessment:   This is a routine wellness examination for Savannah Hood.  I connected with Savannah Hood today by telephone and verified that I am speaking with the correct person using two identifiers. Location patient: home Location provider: work Persons participating in the virtual visit: patient, Marine scientist.    I discussed the limitations, risks, security and privacy concerns of performing an evaluation and management service by telephone and the availability of in person appointments. The patient expressed understanding and verbally consented to this telephonic  visit.    Interactive audio and video telecommunications were attempted between this provider and patient, however failed, due to patient having technical difficulties OR patient did not have access to video capability.  We continued and completed visit with audio only.  Some vital signs may be absent or patient reported.   Hearing/Vision screen  Hearing Screening   125Hz  250Hz  500Hz  1000Hz  2000Hz  3000Hz  4000Hz  6000Hz  8000Hz   Right ear:           Left ear:           Comments: Hearing aid, bilateral   Vision Screening Comments: Wears corrective lenses Visual acuity not assessed, virtual visit. They have seen their  ophthalmologist.   Dietary issues and exercise activities discussed: Current Exercise Habits: Home exercise routine, Intensity: Mild  Healthy diet Good water intake  Goals      Follow up with Primary Care Provider     As needed       Depression Screen PHQ 2/9 Scores 05/11/2020 05/11/2019 11/19/2018 04/10/2017 08/28/2015 10/24/2013 08/09/2012  PHQ - 2 Score 0 0 0 0 0 0 0  PHQ- 9 Score - - 0 1 - - -    Fall Risk Fall Risk  05/11/2020 05/11/2019 11/19/2018 04/10/2017 08/28/2015  Falls in the past year? 0 0 0 No No  Number falls in past yr: 0 - - - -  Injury with Fall? 0 - - - -  Follow up Falls evaluation completed Falls evaluation completed Falls evaluation completed - -    FALL RISK PREVENTION PERTAINING TO THE HOME: Handrail in use when climbing stairs?Yes Home free of loose throw rugs in walkways, pet beds, electrical cords, etc? Yes  Adequate lighting in your home to reduce risk of falls? Yes   ASSISTIVE DEVICES UTILIZED TO PREVENT FALLS: Life alert? No  Use of a cane, walker or w/c? No   TIMED UP AND GO: Was the test performed? No . Virtual visit.  Cognitive Function:  Patient is alert and oriented x3.  Denies difficulty focusing, making decisions, memory loss.  Enjoys brain health activities.  MMSE/6CIT deferred. Normal by direct communication/observation.     6CIT Screen 05/11/2019  What Year? 0 points  What month? 0 points  What time? 0 points  Count back from 20 0 points  Months in reverse 0 points  Repeat phrase 0 points  Total Score 0    Immunizations Immunization History  Administered Date(s) Administered   Fluad Quad(high Dose 65+) 11/19/2018   Influenza Split 01/27/2011, 01/17/2016   Influenza, High Dose Seasonal PF 01/09/2017, 12/22/2017   Influenza,inj,Quad PF,6+ Mos 03/15/2013, 12/15/2013   Influenza-Unspecified 12/26/2019   PFIZER(Purple Top)SARS-COV-2 Vaccination 04/14/2019, 05/05/2019, 12/19/2019   Pneumococcal Conjugate-13 08/28/2015   Pneumococcal Polysaccharide-23 03/11/2013   Tdap 01/27/2011   Zoster 11/20/2011   Zoster Recombinat (Shingrix) 09/09/2017, 12/02/2017   Health Maintenance There are no preventive care reminders to display for this patient.  Health Maintenance  Topic Date Due   TETANUS/TDAP  01/26/2021   MAMMOGRAM  02/06/2021   COLONOSCOPY (Pts 45-3yrs Insurance coverage will need to be confirmed)  09/30/2026   INFLUENZA VACCINE  Completed   DEXA SCAN  Completed   COVID-19 Vaccine  Completed   Hepatitis C Screening  Completed   PNA vac Low Risk Adult  Completed   Colorectal cancer screening: Type of screening: Colonoscopy. Completed 09/29/16. Repeat every 10 years  Mammogram status: Completed 02/07/20. Repeat every year  Lung Cancer Screening: (Low Dose CT Chest recommended if Age 67-80 years, 30 pack-year currently smoking OR have quit w/in 15years.) does not qualify.   Hepatitis C Screening: Completed 08/28/16.  Vision Screening: Recommended annual ophthalmology exams for early detection of glaucoma and other disorders of the eye. Is the patient up to date with their annual eye exam?  Yes   Dental Screening: Recommended annual dental exams for proper oral hygiene.  Community Resource Referral / Chronic Care Management: CRR required this visit?  No   CCM required this visit?  No       Plan:   Keep all routine maintenance appointments.   Cpe 06/07/20   I have personally reviewed and noted the following in the patient's chart:  Medical and social history Use of alcohol, tobacco or illicit drugs  Current medications and supplements Functional ability and status Nutritional status Physical activity Advanced directives List of other physicians Hospitalizations, surgeries, and ER visits in previous 12 months Vitals Screenings to include cognitive, depression, and falls Referrals and appointments  In addition, I have reviewed and discussed with patient certain preventive protocols, quality metrics, and best practice recommendations. A written personalized care plan for preventive services as well as general preventive health recommendations were provided to patient via mychart.     OBrien-Blaney, Savannah Lozada L, LPN   [id number]    I have reviewed the above information and agree with above.   Deborra Medina, MD

## 2020-05-11 NOTE — Patient Instructions (Addendum)
Savannah Hood , Thank you for taking time to come for your Medicare Wellness Visit. I appreciate your ongoing commitment to your health goals. Please review the following plan we discussed and let me know if I can assist you in the future.   These are the goals we discussed: Goals    . Follow up with Primary Care Provider     As needed       This is a list of the screening recommended for you and due dates:  Health Maintenance  Topic Date Due  . Tetanus Vaccine  01/26/2021  . Mammogram  02/06/2021  . Colon Cancer Screening  09/30/2026  . Flu Shot  Completed  . DEXA scan (bone density measurement)  Completed  . COVID-19 Vaccine  Completed  .  Hepatitis C: One time screening is recommended by Center for Disease Control  (CDC) for  adults born from [birth year] through 1963.   Completed  . Pneumonia vaccines  Completed    Immunizations Immunization History  Administered Date(s) Administered  . Fluad Quad(high Dose 65+) 11/19/2018  . Influenza Split 01/27/2011, 01/17/2016  . Influenza, High Dose Seasonal PF 01/09/2017, 12/22/2017  . Influenza,inj,Quad PF,6+ Mos 03/15/2013, 12/15/2013  . Influenza-Unspecified 12/26/2019  . PFIZER(Purple Top)SARS-COV-2 Vaccination 04/14/2019, 05/05/2019, 12/19/2019  . Pneumococcal Conjugate-13 08/28/2015  . Pneumococcal Polysaccharide-23 03/11/2013  . Tdap 01/27/2011  . Zoster 11/20/2011  . Zoster Recombinat (Shingrix) 09/09/2017, 12/02/2017   Advanced directives: End of life planning; Advance aging; Advanced directives discussed.  Copy of current HCPOA/Living Will requested.    Conditions/risks identified: none new  Follow up in one year for your annual wellness visit   Preventive Care 65 Years and Older, Female Preventive care refers to lifestyle choices and visits with your health care provider that can promote health and wellness. What does preventive care include?  A yearly physical exam. This is also called an annual well check.  Dental  exams once or twice a year.  Routine eye exams. Ask your health care provider how often you should have your eyes checked.  Personal lifestyle choices, including:  Daily care of your teeth and gums.  Regular physical activity.  Eating a healthy diet.  Avoiding tobacco and drug use.  Limiting alcohol use.  Practicing safe sex.  Taking low-dose aspirin every day.  Taking vitamin and mineral supplements as recommended by your health care provider. What happens during an annual well check? The services and screenings done by your health care provider during your annual well check will depend on your age, overall health, lifestyle risk factors, and family history of disease. Counseling  Your health care provider may ask you questions about your:  Alcohol use.  Tobacco use.  Drug use.  Emotional well-being.  Home and relationship well-being.  Sexual activity.  Eating habits.  History of falls.  Memory and ability to understand (cognition).  Work and work Statistician.  Reproductive health. Screening  You may have the following tests or measurements:  Height, weight, and BMI.  Blood pressure.  Lipid and cholesterol levels. These may be checked every 5 years, or more frequently if you are over 16 years old.  Skin check.  Lung cancer screening. You may have this screening every year starting at age 87 if you have a 30-pack-year history of smoking and currently smoke or have quit within the past 15 years.  Fecal occult blood test (FOBT) of the stool. You may have this test every year starting at age 71.  Flexible sigmoidoscopy or  colonoscopy. You may have a sigmoidoscopy every 5 years or a colonoscopy every 10 years starting at age 24.  Hepatitis C blood test.  Hepatitis B blood test.  Sexually transmitted disease (STD) testing.  Diabetes screening. This is done by checking your blood sugar (glucose) after you have not eaten for a while (fasting). You may  have this done every 1-3 years.  Bone density scan. This is done to screen for osteoporosis. You may have this done starting at age 21.  Mammogram. This may be done every 1-2 years. Talk to your health care provider about how often you should have regular mammograms. Talk with your health care provider about your test results, treatment options, and if necessary, the need for more tests. Vaccines  Your health care provider may recommend certain vaccines, such as:  Influenza vaccine. This is recommended every year.  Tetanus, diphtheria, and acellular pertussis (Tdap, Td) vaccine. You may need a Td booster every 10 years.  Zoster vaccine. You may need this after age 79.  Pneumococcal 13-valent conjugate (PCV13) vaccine. One dose is recommended after age 57.  Pneumococcal polysaccharide (PPSV23) vaccine. One dose is recommended after age 45. Talk to your health care provider about which screenings and vaccines you need and how often you need them. This information is not intended to replace advice given to you by your health care provider. Make sure you discuss any questions you have with your health care provider. Document Released: 04/06/2015 Document Revised: 11/28/2015 Document Reviewed: 01/09/2015 Elsevier Interactive Patient Education  2017 [place] Prevention in the Home Falls can cause injuries. They can happen to people of all ages. There are many things you can do to make your home safe and to help prevent falls. What can I do on the outside of my home?  Regularly fix the edges of walkways and driveways and fix any cracks.  Remove anything that might make you trip as you walk through a door, such as a raised step or threshold.  Trim any bushes or trees on the path to your home.  Use bright outdoor lighting.  Clear any walking paths of anything that might make someone trip, such as rocks or tools.  Regularly check to see if handrails are loose or broken. Make  sure that both sides of any steps have handrails.  Any raised decks and porches should have guardrails on the edges.  Have any leaves, snow, or ice cleared regularly.  Use sand or salt on walking paths during winter.  Clean up any spills in your garage right away. This includes oil or grease spills. What can I do in the bathroom?  Use night lights.  Install grab bars by the toilet and in the tub and shower. Do not use towel bars as grab bars.  Use non-skid mats or decals in the tub or shower.  If you need to sit down in the shower, use a plastic, non-slip stool.  Keep the floor dry. Clean up any water that spills on the floor as soon as it happens.  Remove soap buildup in the tub or shower regularly.  Attach bath mats securely with double-sided non-slip rug tape.  Do not have throw rugs and other things on the floor that can make you trip. What can I do in the bedroom?  Use night lights.  Make sure that you have a light by your bed that is easy to reach.  Do not use any sheets or blankets that are too  big for your bed. They should not hang down onto the floor.  Have a firm chair that has side arms. You can use this for support while you get dressed.  Do not have throw rugs and other things on the floor that can make you trip. What can I do in the kitchen?  Clean up any spills right away.  Avoid walking on wet floors.  Keep items that you use a lot in easy-to-reach places.  If you need to reach something above you, use a strong step stool that has a grab bar.  Keep electrical cords out of the way.  Do not use floor polish or wax that makes floors slippery. If you must use wax, use non-skid floor wax.  Do not have throw rugs and other things on the floor that can make you trip. What can I do with my stairs?  Do not leave any items on the stairs.  Make sure that there are handrails on both sides of the stairs and use them. Fix handrails that are broken or loose.  Make sure that handrails are as long as the stairways.  Check any carpeting to make sure that it is firmly attached to the stairs. Fix any carpet that is loose or worn.  Avoid having throw rugs at the top or bottom of the stairs. If you do have throw rugs, attach them to the floor with carpet tape.  Make sure that you have a light switch at the top of the stairs and the bottom of the stairs. If you do not have them, ask someone to add them for you. What else can I do to help prevent falls?  Wear shoes that:  Do not have high heels.  Have rubber bottoms.  Are comfortable and fit you well.  Are closed at the toe. Do not wear sandals.  If you use a stepladder:  Make sure that it is fully opened. Do not climb a closed stepladder.  Make sure that both sides of the stepladder are locked into place.  Ask someone to hold it for you, if possible.  Clearly mark and make sure that you can see:  Any grab bars or handrails.  First and last steps.  Where the edge of each step is.  Use tools that help you move around (mobility aids) if they are needed. These include:  Canes.  Walkers.  Scooters.  Crutches.  Turn on the lights when you go into a dark area. Replace any light bulbs as soon as they burn out.  Set up your furniture so you have a clear path. Avoid moving your furniture around.  If any of your floors are uneven, fix them.  If there are any pets around you, be aware of where they are.  Review your medicines with your doctor. Some medicines can make you feel dizzy. This can increase your chance of falling. Ask your doctor what other things that you can do to help prevent falls. This information is not intended to replace advice given to you by your health care provider. Make sure you discuss any questions you have with your health care provider. Document Released: 01/04/2009 Document Revised: 08/16/2015 Document Reviewed: 04/14/2014 Elsevier Interactive Patient  Education  2017 [employer].

## 2020-05-28 ENCOUNTER — Other Ambulatory Visit: Payer: Self-pay | Admitting: Internal Medicine

## 2020-06-05 DIAGNOSIS — H6123 Impacted cerumen, bilateral: Secondary | ICD-10-CM | POA: Diagnosis not present

## 2020-06-05 DIAGNOSIS — H903 Sensorineural hearing loss, bilateral: Secondary | ICD-10-CM | POA: Diagnosis not present

## 2020-06-07 ENCOUNTER — Ambulatory Visit (INDEPENDENT_AMBULATORY_CARE_PROVIDER_SITE_OTHER): Payer: Medicare HMO

## 2020-06-07 ENCOUNTER — Encounter: Payer: Self-pay | Admitting: Internal Medicine

## 2020-06-07 ENCOUNTER — Ambulatory Visit (INDEPENDENT_AMBULATORY_CARE_PROVIDER_SITE_OTHER): Payer: Medicare HMO | Admitting: Internal Medicine

## 2020-06-07 ENCOUNTER — Other Ambulatory Visit: Payer: Self-pay

## 2020-06-07 VITALS — BP 138/88 | HR 79 | Temp 97.7°F | Resp 16 | Ht 66.0 in | Wt 239.8 lb

## 2020-06-07 DIAGNOSIS — I10 Essential (primary) hypertension: Secondary | ICD-10-CM

## 2020-06-07 DIAGNOSIS — E7849 Other hyperlipidemia: Secondary | ICD-10-CM

## 2020-06-07 DIAGNOSIS — M419 Scoliosis, unspecified: Secondary | ICD-10-CM | POA: Diagnosis not present

## 2020-06-07 DIAGNOSIS — Z780 Asymptomatic menopausal state: Secondary | ICD-10-CM

## 2020-06-07 DIAGNOSIS — M5431 Sciatica, right side: Secondary | ICD-10-CM | POA: Diagnosis not present

## 2020-06-07 DIAGNOSIS — E1169 Type 2 diabetes mellitus with other specified complication: Secondary | ICD-10-CM | POA: Insufficient documentation

## 2020-06-07 DIAGNOSIS — Z0000 Encounter for general adult medical examination without abnormal findings: Secondary | ICD-10-CM

## 2020-06-07 DIAGNOSIS — N1831 Chronic kidney disease, stage 3a: Secondary | ICD-10-CM | POA: Diagnosis not present

## 2020-06-07 DIAGNOSIS — H409 Unspecified glaucoma: Secondary | ICD-10-CM

## 2020-06-07 DIAGNOSIS — I700 Atherosclerosis of aorta: Secondary | ICD-10-CM | POA: Diagnosis not present

## 2020-06-07 DIAGNOSIS — M47816 Spondylosis without myelopathy or radiculopathy, lumbar region: Secondary | ICD-10-CM | POA: Diagnosis not present

## 2020-06-07 DIAGNOSIS — E559 Vitamin D deficiency, unspecified: Secondary | ICD-10-CM

## 2020-06-07 DIAGNOSIS — E785 Hyperlipidemia, unspecified: Secondary | ICD-10-CM

## 2020-06-07 LAB — LIPID PANEL
Cholesterol: 236 mg/dL — ABNORMAL HIGH (ref 0–200)
HDL: 52.5 mg/dL (ref >39.00)
NonHDL: 183.55
Total CHOL/HDL Ratio: 4
Triglycerides: 205 mg/dL — ABNORMAL HIGH (ref 0.0–149.0)
VLDL: 41 mg/dL — ABNORMAL HIGH (ref 0.0–40.0)

## 2020-06-07 LAB — COMPREHENSIVE METABOLIC PANEL
ALT: 19 U/L (ref 0–35)
AST: 20 U/L (ref 0–37)
Albumin: 3.9 g/dL (ref 3.5–5.2)
Alkaline Phosphatase: 110 U/L (ref 39–117)
BUN: 22 mg/dL (ref 6–23)
CO2: 26 mEq/L (ref 19–32)
Calcium: 9.1 mg/dL (ref 8.4–10.5)
Chloride: 103 mEq/L (ref 96–112)
Creatinine, Ser: 0.91 mg/dL (ref 0.40–1.20)
GFR: 62.87 mL/min (ref >60.00)
Glucose, Bld: 108 mg/dL — ABNORMAL HIGH (ref 70–99)
Potassium: 4.5 mEq/L (ref 3.5–5.1)
Sodium: 137 mEq/L (ref 135–145)
Total Bilirubin: 0.6 mg/dL (ref 0.2–1.2)
Total Protein: 6.5 g/dL (ref 6.0–8.3)

## 2020-06-07 LAB — MICROALBUMIN / CREATININE URINE RATIO
Creatinine,U: 103.2 mg/dL
Microalb Creat Ratio: 0.7 mg/g (ref 0.0–30.0)
Microalb, Ur: <0.7 mg/dL (ref 0.0–1.9)

## 2020-06-07 LAB — HEMOGLOBIN A1C: Hgb A1c MFr Bld: 5.4 % (ref 4.6–6.5)

## 2020-06-07 LAB — LDL CHOLESTEROL, DIRECT: Direct LDL: 143 mg/dL

## 2020-06-07 LAB — TSH: TSH: 2.26 u[IU]/mL (ref 0.35–4.50)

## 2020-06-07 LAB — VITAMIN D 25 HYDROXY (VIT D DEFICIENCY, FRACTURES): VITD: 24.18 ng/mL — ABNORMAL LOW (ref 30.00–100.00)

## 2020-06-07 MED ORDER — TETANUS-DIPHTH-ACELL PERTUSSIS 5-2.5-18.5 LF-MCG/0.5 IM SUSY: 0.5000 mL | PREFILLED_SYRINGE | Freq: Once | INTRAMUSCULAR | 0 refills | Status: DC

## 2020-06-07 NOTE — Patient Instructions (Addendum)
You can take up to 2000 mg of acetominophen (tylenol) every day safely  In divided doses (500 mg every 6 hours  Or 1000 mg every 12 hours.)   We will order PT .   We will add tramadol and/or gabapentin if needed so you can rest better.   You have mild but stable kidney disease.  Please try to increase your water (or other non caffeinated beverages) to a minimum 60 ounces daily , keep BP under 130/80,  and avoid using all  anti inflammatories such as advil, aleve (ibuprofen and naproxen) as they can cause deterioration in renal function.  Tylenol is not an anti inflammatory ,  So you can use it safely   You are due for Tdap vaccine  (rx given)   Health Maintenance After Age 31 After age 10, you are at a higher risk for certain long-term diseases and infections as well as injuries from falls. Falls are a major cause of broken bones and head injuries in people who are older than age 16. Getting regular preventive care can help to keep you healthy and well. Preventive care includes getting regular testing and making lifestyle changes as recommended by your health care provider. Talk with your health care provider about:  Which screenings and tests you should have. A screening is a test that checks for a disease when you have no symptoms.  A diet and exercise plan that is right for you. What should I know about screenings and tests to prevent falls? Screening and testing are the best ways to find a health problem early. Early diagnosis and treatment give you the best chance of managing medical conditions that are common after age 55. Certain conditions and lifestyle choices may make you more likely to have a fall. Your health care provider may recommend:  Regular vision checks. Poor vision and conditions such as cataracts can make you more likely to have a fall. If you wear glasses, make sure to get your prescription updated if your vision changes.  Medicine review. Work with your health care  provider to regularly review all of the medicines you are taking, including over-the-counter medicines. Ask your health care provider about any side effects that may make you more likely to have a fall. Tell your health care provider if any medicines that you take make you feel dizzy or sleepy.  Osteoporosis screening. Osteoporosis is a condition that causes the bones to get weaker. This can make the bones weak and cause them to break more easily.  Blood pressure screening. Blood pressure changes and medicines to control blood pressure can make you feel dizzy.  Strength and balance checks. Your health care provider may recommend certain tests to check your strength and balance while standing, walking, or changing positions.  Foot health exam. Foot pain and numbness, as well as not wearing proper footwear, can make you more likely to have a fall.  Depression screening. You may be more likely to have a fall if you have a fear of falling, feel emotionally low, or feel unable to do activities that you used to do.  Alcohol use screening. Using too much alcohol can affect your balance and may make you more likely to have a fall. What actions can I take to lower my risk of falls? General instructions  Talk with your health care provider about your risks for falling. Tell your health care provider if: ? You fall. Be sure to tell your health care provider about all falls,  even ones that seem minor. ? You feel dizzy, sleepy, or off-balance.  Take over-the-counter and prescription medicines only as told by your health care provider. These include any supplements.  Eat a healthy diet and maintain a healthy weight. A healthy diet includes low-fat dairy products, low-fat (lean) meats, and fiber from whole grains, beans, and lots of fruits and vegetables. Home safety  Remove any tripping hazards, such as rugs, cords, and clutter.  Install safety equipment such as grab bars in bathrooms and safety rails  on stairs.  Keep rooms and walkways well-lit. Activity  Follow a regular exercise program to stay fit. This will help you maintain your balance. Ask your health care provider what types of exercise are appropriate for you.  If you need a cane or walker, use it as recommended by your health care provider.  Wear supportive shoes that have nonskid soles.   Lifestyle  Do not drink alcohol if your health care provider tells you not to drink.  If you drink alcohol, limit how much you have: ? 0-1 drink a day for women. ? 0-2 drinks a day for men.  Be aware of how much alcohol is in your drink. In the [place] one drink equals one typical bottle of beer (12 oz), one-half glass of wine (5 oz), or one shot of hard liquor (1 oz).  Do not use any products that contain nicotine or tobacco, such as cigarettes and e-cigarettes. If you need help quitting, ask your health care provider. Summary  Having a healthy lifestyle and getting preventive care can help to protect your health and wellness after age 16.  Screening and testing are the best way to find a health problem early and help you avoid having a fall. Early diagnosis and treatment give you the best chance for managing medical conditions that are more common for people who are older than age 19.  Falls are a major cause of broken bones and head injuries in people who are older than age 41. Take precautions to prevent a fall at home.  Work with your health care provider to learn what changes you can make to improve your health and wellness and to prevent falls. This information is not intended to replace advice given to you by your health care provider. Make sure you discuss any questions you have with your health care provider. Document Revised: 07/01/2018 Document Reviewed: 01/21/2017 Elsevier Patient Education  2021 [employer].

## 2020-06-07 NOTE — Progress Notes (Signed)
Patient ID: Savannah Hood, female    DOB: 03-12-1946  Age: 75 y.o. MRN: [id number]  The patient is here for annual preventive  examination and management of other chronic and acute problems.   The risk factors are reflected in the social history.  The roster of all physicians providing medical care to patient - is listed in the Snapshot section of the chart.  Activities of daily living:  The patient is 100% independent in all ADLs: dressing, toileting, feeding as well as independent mobility  Home safety : The patient has smoke detectors in the home. They wear seatbelts.  There are no firearms at home. There is no violence in the home.   There is no risks for hepatitis, STDs or HIV. There is no   history of blood transfusion. They have no travel history to infectious disease endemic areas of the world.  The patient has seen their dentist in the last six month. They have seen their eye doctor in the last year. They admit to slight hearing difficulty with regard to whispered voices and some television programs.  They have deferred audiologic testing in the last year.  They do not  have excessive sun exposure. Discussed the need for sun protection: hats, long sleeves and use of sunscreen if there is significant sun exposure.   Diet: the importance of a healthy diet is discussed. They do have a healthy diet.  The benefits of regular aerobic exercise were discussed. She walks 4 times per week ,  20 minutes.   Depression screen: there are no signs or vegative symptoms of depression- irritability, change in appetite, anhedonia, sadness/tearfullness.  Cognitive assessment: the patient manages all their financial and personal affairs and is actively engaged. They could relate day,date,year and events; recalled 2/3 objects at 3 minutes; performed clock-face test normally.  The following portions of the patient's history were reviewed and updated as appropriate: allergies, current medications, past  family history, past medical history,  past surgical history, past social history  and problem list.  Visual acuity was not assessed per patient preference since she has regular follow up with her ophthalmologist. Hearing and body mass index were assessed and reviewed.   During the course of the visit the patient was educated and counseled about appropriate screening and preventive services including : fall prevention , diabetes screening, nutrition counseling, colorectal cancer screening, and recommended immunizations.    CC: The primary encounter diagnosis was Hyperlipidemia associated with type 2 diabetes mellitus ([place]). Diagnoses of Chronic sciatica, right, Postmenopausal estrogen deficiency, Glaucoma, unspecified glaucoma type, unspecified laterality, Stage 3a chronic kidney disease ([place]), Familial hyperlipidemia, high LDL, Primary hypertension, Vitamin D deficiency, Routine general medical examination at a health care facility, and Abdominal aortic atherosclerosis ([place]) were also pertinent to this visit.  1) right sided sciatica for the past year,  More persistent recently.  No response to chiropractor  But receiving acupuncture by Savannah Hood in [facility] and finds that the treatment helped after first session .  4 sessions 1-2 weeks apart planned.  Wants PT   2) Hypertension: patient checks blood pressure twice weekly at home.  Readings have been for the most part < 140/80 at rest . Patient is following a reduced salt diet most days and is taking medications as prescribed  3) Aortic atherosclerosis :  Discussed need for statin therapy given documented evidence of moderate  atherosclerosis in the aorta noted on today's plain films    History Savannah Hood has a past medical history of  Arthritis, Colon polyps, Diabetes mellitus, Glaucoma, Hyperlipidemia, Hypertension, and Obesity.   She has a past surgical history that includes Abdominal hysterectomy.   Her family history includes Arthritis in her  mother and sister; Cancer in her father; Diabetes in her father and paternal grandmother.She reports that she has never smoked. She has never used smokeless tobacco. She reports current alcohol use. She reports that she does not use drugs.  Outpatient Medications Prior to Visit  Medication Sig Dispense Refill  . amLODipine (NORVASC) 10 MG tablet TAKE ONE TABLET BY MOUTH EVERY DAY 90 tablet 3  . Cholecalciferol (VITAMIN D3) 1000 units CAPS Take 2 capsules by mouth daily.     . Clotrimazole 1 % OINT     . COD LIVER OIL PO Take 1 capsule by mouth daily.     Marland Kitchen latanoprost (XALATAN) 0.005 % ophthalmic solution Place 1 drop into both eyes daily.    Marland Kitchen losartan (COZAAR) 100 MG tablet TAKE 1 TABLET BY MOUTH DAILY 90 tablet 1  . LUTEIN PO Take 1 tablet by mouth daily.    . Misc Natural Products (TURMERIC CURCUMIN) CAPS Take 1 capsule by mouth daily.    . mometasone (ELOCON) 0.1 % lotion PLACE 2 TO 4 DROPS INTO RIGHT EAR EACH WEEK    . Multiple Vitamins-Minerals (PRESERVISION AREDS 2 PO) Take 1 tablet by mouth daily.    . timolol (TIMOPTIC) 0.5 % ophthalmic solution INSTILL 1 DROP INTO LEFT EYE EVERY DAY  6  . Trolamine Salicylate (ASPERCREME) 10 % LOTN     . vitamin B-12 (CYANOCOBALAMIN) 1000 MCG tablet Take 1,000 mcg by mouth daily.    Marland Kitchen amLODipine (NORVASC) 10 MG tablet TAKE ONE TABLET BY MOUTH EVERY DAY 90 tablet 3  . FA-BENFOTIAMINE-PABA-E-LIPOIC PO Take 1 capsule by mouth daily.     No facility-administered medications prior to visit.    Review of Systems  Objective:  BP 138/88 (BP Location: Left Arm, Patient Position: Sitting, Cuff Size: Normal)   Pulse 79   Temp 97.7 F (36.5 C) (Oral)   Resp 16   Ht 5\' 6"  (1.676 m)   Wt 239 lb 12.8 oz (108.8 kg)   SpO2 97%   BMI 38.70 kg/m   Physical Exam    Assessment & Plan:   Problem List Items Addressed This Visit      Unprioritized   Abdominal aortic atherosclerosis ([place])     Aortic atherosclerosis :  Discuss need for statin therapy  given documented evidence of moderate  atherosclerosis in the aorta noted on recent  Palin films of lumbar spine and the prognostic implications of this finding.       Chronic sciatica, right    Improving with acupuncture.  PT needed to strengthen paraspinus muscles.       Relevant Orders   DG Lumbar Spine Complete (Completed)   Ambulatory referral to Physical Therapy   CKD (chronic kidney disease) stage 3, GFR 30-59 ml/min (HCC)    Renal function is at  baseline with avoidance of NSAIDs.  She is on an ARB for control of hypertension,  Lab Results  Component Value Date   CREATININE 0.91 06/07/2020   Lab Results  Component Value Date   NA 137 06/07/2020   K 4.5 06/07/2020   CL 103 06/07/2020   CO2 26 06/07/2020         Relevant Orders   Comprehensive metabolic panel (Completed)   Hemoglobin A1c (Completed)   Familial hyperlipidemia, high LDL    Based  on last year's lipid profile, the risk of clinically significant CAD was 12%  over the next 10 years, using the Framingham risk calculator. She has aortic atherosclerosis ; statin therapy is advised regardless of risk computed with FRC or AHA  Lab Results  Component Value Date   CHOL 236 (H) 06/07/2020   HDL 52.50 06/07/2020   LDLCALC 148 (H) 11/19/2018   LDLDIRECT 143.0 06/07/2020   TRIG 205.0 (H) 06/07/2020   CHOLHDL 4 06/07/2020        Relevant Orders   Lipid panel (Completed)   TSH (Completed)   Glaucoma   Hyperlipidemia associated with type 2 diabetes mellitus ([place]) - Primary   Hypertension   Relevant Orders   Microalbumin / creatinine urine ratio (Completed)   Routine general medical examination at a health care facility    age appropriate education and counseling updated, referrals for preventative services and immunizations addressed, dietary and smoking counseling addressed, most recent labs reviewed.  I have personally reviewed and have noted:  1) the patient's medical and social history 2) The pt's use  of alcohol, tobacco, and illicit drugs 3) The patient's current medications and supplements 4) Functional ability including ADL's, fall risk, home safety risk, hearing and visual impairment 5) Diet and physical activities 6) Evidence for depression or mood disorder 7) The patient's height, weight, and BMI have been recorded in the chart  I have made referrals, and provided counseling and education based on review of the above      Vitamin D deficiency   Relevant Orders   VITAMIN D 25 Hydroxy (Vit-D Deficiency, Fractures) (Completed)    Other Visit Diagnoses    Postmenopausal estrogen deficiency       Relevant Orders   DG Bone Density      I have discontinued Quintella Reichert "Val"'s FA-BENFOTIAMINE-PABA-E-LIPOIC PO. I am also having her start on Tdap. Additionally, I am having her maintain her latanoprost, LUTEIN PO, vitamin B-12, timolol, Vitamin D3, Turmeric Curcumin, Multiple Vitamins-Minerals (PRESERVISION AREDS 2 PO), COD LIVER OIL PO, amLODipine, losartan, Clotrimazole, mometasone, and Aspercreme.  Meds ordered this encounter  Medications  . Tdap (BOOSTRIX) 5-2.5-18.5 LF-MCG/0.5 injection    Sig: Inject 0.5 mLs into the muscle once for 1 dose.    Dispense:  0.5 mL    Refill:  0    Medications Discontinued During This Encounter  Medication Reason  . amLODipine (NORVASC) 10 MG tablet Duplicate  . FA-BENFOTIAMINE-PABA-E-LIPOIC PO     Follow-up: No follow-ups on file.   Crecencio Mc, MD

## 2020-06-09 DIAGNOSIS — I700 Atherosclerosis of aorta: Secondary | ICD-10-CM | POA: Insufficient documentation

## 2020-06-09 MED ORDER — ATORVASTATIN CALCIUM 20 MG PO TABS: 20.0000 mg | ORAL_TABLET | Freq: Every day | ORAL | 3 refills | Status: DC

## 2020-06-09 NOTE — Assessment & Plan Note (Signed)

## 2020-06-09 NOTE — Assessment & Plan Note (Signed)
  Aortic atherosclerosis :  Discuss need for statin therapy given documented evidence of moderate  atherosclerosis in the aorta noted on recent  Palin films of lumbar spine and the prognostic implications of this finding.

## 2020-06-09 NOTE — Assessment & Plan Note (Signed)
Renal function is at  baseline with avoidance of NSAIDs.  She is on an ARB for control of hypertension,  Lab Results  Component Value Date   CREATININE 0.91 06/07/2020   Lab Results  Component Value Date   NA 137 06/07/2020   K 4.5 06/07/2020   CL 103 06/07/2020   CO2 26 06/07/2020

## 2020-06-09 NOTE — Assessment & Plan Note (Signed)
Based on last year's lipid profile, the risk of clinically significant CAD was 12%  over the next 10 years, using the Framingham risk calculator. She has aortic atherosclerosis ; statin therapy is advised regardless of risk computed with FRC or AHA  Lab Results  Component Value Date   CHOL 236 (H) 06/07/2020   HDL 52.50 06/07/2020   LDLCALC 148 (H) 11/19/2018   LDLDIRECT 143.0 06/07/2020   TRIG 205.0 (H) 06/07/2020   CHOLHDL 4 06/07/2020

## 2020-06-09 NOTE — Assessment & Plan Note (Signed)
Improving with acupuncture.  PT needed to strengthen paraspinus muscles.

## 2020-06-10 ENCOUNTER — Other Ambulatory Visit: Payer: Self-pay | Admitting: Internal Medicine

## 2020-06-10 MED ORDER — ERGOCALCIFEROL 1.25 MG (50000 UT) PO CAPS: 50000.0000 [IU] | ORAL_CAPSULE | ORAL | 3 refills | Status: DC

## 2020-06-12 DIAGNOSIS — I10 Essential (primary) hypertension: Secondary | ICD-10-CM

## 2020-06-12 DIAGNOSIS — I700 Atherosclerosis of aorta: Secondary | ICD-10-CM

## 2020-06-13 ENCOUNTER — Ambulatory Visit: Payer: Medicare HMO | Attending: Internal Medicine

## 2020-06-13 ENCOUNTER — Other Ambulatory Visit: Payer: Self-pay

## 2020-06-13 DIAGNOSIS — G8929 Other chronic pain: Secondary | ICD-10-CM | POA: Diagnosis not present

## 2020-06-13 DIAGNOSIS — M5441 Lumbago with sciatica, right side: Secondary | ICD-10-CM | POA: Diagnosis not present

## 2020-06-13 DIAGNOSIS — M6281 Muscle weakness (generalized): Secondary | ICD-10-CM | POA: Insufficient documentation

## 2020-06-13 NOTE — Assessment & Plan Note (Signed)
Home readings have been reported for the last 3 days and are all <130/80

## 2020-06-13 NOTE — Patient Instructions (Addendum)
Seated R piriformis stretch 30 seconds x 5  Feels good per pt. Reviewed and given as part of her HEP. Pt demonstrated and verbalized understanding.    Pt was also recommended to lie on her L side when in bed to help correct L lateral shift posture. Pt verbalized understanding.

## 2020-06-13 NOTE — Assessment & Plan Note (Signed)
She declines statin therapy due to prior trial which she reports caused diabetes and fatty liver. She is willing to start a trial of red yeast rice

## 2020-06-13 NOTE — Therapy (Signed)
[facility] [address], [place], [address] Phone: [phone]   Fax:  [phone]  Physical Therapy Evaluation  Patient Details  Name: Savannah Hood MRN: [id number] Date of Birth: June 10, 1945 Referring Provider (PT): Crecencio Mc, MD   Encounter Date: 06/13/2020   PT End of Session - 06/13/20 1631    Visit Number 1    Number of Visits 17    Date for PT Re-Evaluation 08/09/20    Authorization Type 1    Authorization Time Period of 10 progress report    PT Start Time 1632    PT Stop Time 1718    PT Time Calculation (min) 46 min    Activity Tolerance Patient tolerated treatment well    Behavior During Therapy [facility] for tasks assessed/performed           Past Medical History:  Diagnosis Date  . Arthritis   . Colon polyps   . Diabetes mellitus    pre-diabetes in past  . Glaucoma   . Hyperlipidemia   . Hypertension   . Obesity     Past Surgical History:  Procedure Laterality Date  . ABDOMINAL HYSTERECTOMY     total, secondary to uterine polyps, Dr. Rayford Halsted    There were no vitals filed for this visit.    Subjective Assessment - 06/13/20 1635    Subjective Back pain: 4/10 currently (pt sitting on a chair), 9/10 at most for the past 3 months (usually in the middle of the night). R LE: 0/10 currently, 9/10 at most for the past 3 months (6/10 at the most for the past week).    Pertinent History Chronic sciatica R, and LBP.  Low back pain began a year ago, gradual onset of occasional back pain. Currentlly bothers her sleeping. Denies loss of bowel or bladder control. No LE paresthesia. Tried Chiropractic treatment which did not help. Tried acupuncture which helps. The relief from the acupuncture is not immediate which has helped with her sleep. Currently only able to sleep 3 hours straight prior to her back pain waking her up.    Patient Stated Goals Sleep better.    Currently in Pain? Yes     Pain Score 4     Pain Type Chronic pain    Aggravating Factors  Worst: laying on her back, side, or stomach. Standing for 5-10 minutes, standing in line    Pain Relieving Factors Movement, getting up and walking around, using a grocery/shopping cart.              [facility] PT Assessment - 06/13/20 1644      Assessment   Medical Diagnosis M54.31 (ICD-10-CM) - Chronic sciatica, right    Referring Provider (PT) Crecencio Mc, MD    Onset Date/Surgical Date 06/07/20    Prior Therapy No known PT for current condition      Precautions   Precaution Comments No known precautions      Restrictions   Other Position/Activity Restrictions No known restrictions      Balance Screen   Has the patient fallen in the past 6 months Yes    How many times? 1   Pt was on a steep incline and pt tripped. Random occurence. Other than that, its been years since she last fell.   Has the patient had a decrease in activity level because of a fear of falling?  No    Is the patient reluctant to leave their home  because of a fear of falling?  No      Home Environment   Additional Comments Pt lives in a 1 story home with husband, 3 steps to enter L rail.      Prior Function   Vocation Retired      Observation/Other Assessments   Focus on Therapeutic Outcomes (FOTO)  Pelvis FOTO 47      Posture/Postural Control   Posture Comments forward neck, B protracted shoulders, R iliac crest and greater trochanter higher, L lateral lean, L lumbar rotation      AROM   Lumbar Flexion WFL    Lumbar Extension limited    Lumbar - Right Side [facility] with R hip twinge   no twinge when performed in sitting.   Lumbar - Left Side Bend WFL    Lumbar - Right Rotation [facility]    Lumbar - Left Rotation [facility]      Strength   Right Hip Flexion 4/5    Right Hip Extension 4/5    Right Hip ABduction 4/5    Left Hip Flexion 4-/5    Left Hip Extension 4-/5    Left Hip ABduction 4/5    Right Knee Flexion 4+/5    Right Knee  Extension 5/5    Left Knee Flexion 4+/5    Left Knee Extension 5/5      Palpation   Palpation comment TTP R L 5 TP, L scaral Ala., R distal thigh at the sciatic nerve area. Dull ache with palpation to R posterior hip but also felt good.      Special Tests   Other special tests (-) repeated flexion test      Ambulation/Gait   Gait Comments trendelenberg with R lateral lean during R LE stance phase                      Objective measurements completed on examination: See above findings.       Gait: trendelenberg with R lateral lean during R LE stance phase   No latex allergies Blood pressure is controlled per pt.  No longer diabetic per pt.    Pain location: R low back, posterior hip, and sciatic distribution  Decreased pain with L lateral shift correction   Muscle tension B paraspinal muscles   Improved R posterior hip comfort level with R piriformis test    Response to treatment  Decreased pain with R piriformis stretch as well as L lateral shift correction   Clinical impression Pt is a 75 year old female who came to physical therapy secondary to low back and R LE pain. She also presents with altered gait pattern and posture, bilateral hip weakness, TTP to low back, B paraspinal muscle tension, and difficulty maintaining positions such as standing as well as laying in bed. Pt will benefit from skilled physical therapy services to address the aforementioned deficits.        PT Education - 06/13/20 1920    Education Details ther-ex, HEP, plan of care    Person(s) Educated Patient    Methods Explanation;Demonstration;Tactile cues;Verbal cues    Comprehension Returned demonstration;Verbalized understanding            PT Short Term Goals - 06/13/20 1925      PT SHORT TERM GOAL #1   Title Pt will be independent with her initial HEP to decrease pain, improve ability to stand and sleep with less difficulty.    Baseline Pt has started her HEP  (  06/13/2020)    Time 3    Period Weeks    Status New    Target Date 07/05/20                    PT Long Term Goals - 06/13/20 1928      PT LONG TERM GOAL #1   Title Pt will have a decrease in low back pain to 4/10 or less at most to promote ability to sleep as well as stand more comfortably.    Baseline 9/10 low back pain at most for the past 3 months (06/13/2020)    Time 8    Period Weeks    Status New    Target Date 08/09/20      PT LONG TERM GOAL #2   Title Pt will have a decrease in R LE pain to 3/10 or less at most to promote ability to sleep as well as stand more comfortably.    Baseline 9/10 R LE pain at most for the past 3 months (06/13/2020)    Time 8    Period Weeks    Status New    Target Date 08/09/20      PT LONG TERM GOAL #3   Title Patient will improve bilateral hip extension and abduction strength by at least 1/2 MMT grade to promote ability to perform standing tasks more comfortably.    Baseline Hip extension 4/5 R, 4-/5 L, hip abduction 4/5 R and L (06/13/2020)    Time 8    Period Weeks    Status New    Target Date 08/09/20      PT LONG TERM GOAL #4   Title Pt will improve her pelvis FOTO score by at least 10 points as a demonstration of improved function.    Baseline Pelvis FOTO 47 (06/13/2020)    Time 8    Period Weeks    Status New    Target Date 08/09/20                  Plan - 06/13/20 1920    Clinical Impression Statement Pt is a 75 year old female who came to physical therapy secondary to low back and R LE pain. She also presents with altered gait pattern and posture, bilateral hip weakness, TTP to low back, B paraspinal muscle tension, and difficulty maintaining positions such as standing as well as laying in bed. Pt will benefit from skilled physical therapy services to address the aforementioned deficits.    Personal Factors and Comorbidities Comorbidity 3+;Age;Fitness;Time since onset of injury/illness/exacerbation     Comorbidities Arthritis, HTN, obesity    Examination-Activity Limitations Bed Mobility;Sleep;Stand    Stability/Clinical Decision Making Evolving/Moderate complexity   Pain seems to be worsening based on subjective reports   Clinical Decision Making Moderate    Rehab Potential Fair    PT Frequency 2x / week    PT Duration 8 weeks    PT Treatment/Interventions Therapeutic activities;Therapeutic exercise;Neuromuscular re-education;Patient/family education;Manual techniques;Dry needling;Aquatic Therapy;Electrical Stimulation;Iontophoresis 4mg /ml Dexamethasone;Traction;Functional mobility training    PT Next Visit Plan posture, R hip mobility, thoracic extension, trunk and hip strength, manual techniques, modalities PRN    Consulted and Agree with Plan of Care Patient           Patient will benefit from skilled therapeutic intervention in order to improve the following deficits and impairments:  Pain,Postural dysfunction,Improper body mechanics,Decreased strength,Abnormal gait  Visit Diagnosis: Chronic right-sided low back pain with right-sided sciatica - Plan: PT plan  of care cert/re-cert  Muscle weakness (generalized) - Plan: PT plan of care cert/re-cert     Problem List Patient Active Problem List   Diagnosis Date Noted  . Abdominal aortic atherosclerosis ([place]) 06/09/2020  . Hyperlipidemia associated with type 2 diabetes mellitus ([place]) 06/07/2020  . Glaucoma 06/07/2020  . Chronic sciatica, right 06/07/2020  . Tubular adenoma of colon 11/19/2018  . Digital mucinous cyst of finger 11/21/2017  . Vitamin D deficiency 03/01/2016  . Routine general medical examination at a health care facility 08/28/2015  . Medicare annual wellness visit, subsequent 10/24/2013  . CKD (chronic kidney disease) stage 3, GFR 30-59 ml/min (HCC) 10/24/2013  . Candida rash of groin 08/09/2012  . Familial hyperlipidemia, high LDL 11/20/2011  . Obesity 12/23/2010  . Hypertension 12/23/2010    Joneen Boers  PT, DPT   06/13/2020, 7:39 PM  [facility] [address], [place], [address] Phone: [phone]   Fax:  [phone]  Name: Savannah Hood MRN: [id number] Date of Birth: 06/11/1945

## 2020-06-19 ENCOUNTER — Ambulatory Visit: Payer: Medicare HMO

## 2020-06-19 ENCOUNTER — Other Ambulatory Visit: Payer: Self-pay

## 2020-06-19 DIAGNOSIS — M6281 Muscle weakness (generalized): Secondary | ICD-10-CM

## 2020-06-19 DIAGNOSIS — G8929 Other chronic pain: Secondary | ICD-10-CM | POA: Diagnosis not present

## 2020-06-19 DIAGNOSIS — M5441 Lumbago with sciatica, right side: Secondary | ICD-10-CM | POA: Diagnosis not present

## 2020-06-19 NOTE — Therapy (Signed)
[facility] [address], [place], [address] Phone: [phone]   Fax:  [phone]  Physical Therapy Treatment  Patient Details  Name: Savannah Hood MRN: [id number] Date of Birth: [birth year] Referring Provider (PT): Crecencio Mc, MD   Encounter Date: 06/19/2020   PT End of Session - 06/19/20 1719    Visit Number 2    Number of Visits 17    Date for PT Re-Evaluation 08/09/20    Authorization Type 2    Authorization Time Period of 10 progress report    PT Start Time 1720    PT Stop Time 1801    PT Time Calculation (min) 41 min    Activity Tolerance Patient tolerated treatment well    Behavior During Therapy [facility] for tasks assessed/performed           Past Medical History:  Diagnosis Date  . Arthritis   . Colon polyps   . Diabetes mellitus    pre-diabetes in past  . Glaucoma   . Hyperlipidemia   . Hypertension   . Obesity     Past Surgical History:  Procedure Laterality Date  . ABDOMINAL HYSTERECTOMY     total, secondary to uterine polyps, Dr. Rayford Halsted    There were no vitals filed for this visit.   Subjective Assessment - 06/19/20 1722    Subjective Back is not bad. Pretty good week. Has been doing her her piriformis stretch. Trying to sleep on her L side as well. 2-3/10 currently, low back pain.    Pertinent History Chronic sciatica R, and LBP.  Low back pain began a year ago, gradual onset of occasional back pain. Currentlly bothers her sleeping. Denies loss of bowel or bladder control. No LE paresthesia. Tried Chiropractic treatment which did not help. Tried acupuncture which helps. The relief from the acupuncture is not immediate which has helped with her sleep. Currently only able to sleep 3 hours straight prior to her back pain waking her up.    Patient Stated Goals Sleep better.    Currently in Pain? Yes    Pain Score 3                                       PT Education - 06/19/20 1730    Education Details ther-ex    Person(s) Educated Patient    Methods Explanation;Demonstration;Tactile cues;Verbal cues    Comprehension Returned demonstration;Verbalized understanding           Objective     Gait: trendelenberg with R lateral lean during R LE stance phase   No latex allergies Blood pressure is controlled per pt.  No longer diabetic per pt.    Pain location: R low back, posterior hip, and sciatic distribution Decreased pain with L lateral shift correction   Muscle tension B paraspinal muscles   Improved R posterior hip comfort level with R piriformis test   Medbridge Access Code: [email or web address]  Therapeutic exercise  Standing L lateral shift correction 10x5 seconds for 2 sets  Increased R posterior hip symptoms, eases with rest  Seated hip extension isometrics   R 10x3 with 5 second holds    Seated manually resisted R lateral shift isometrics in neutral to counter L lateral shift posture. 10x3 with 5 second holds   Seated hip adduction 10x3 with 5 second holds  Reviewed HEP. Pt demonstrated and  verbalized understanding.   Seated B scapular retraction to promote thoracic extension 10x3 with 5 second holds   Standing with B UE assist   Hip abduction    R 10x5 seconds for 2 sets    L 10x5 seconds (slight bent over position) for 2 sets   Improved exercise technique, movement at target joints, use of target muscles after mod verbal, visual, tactile cues.   Response to treatment  Pt tolerated session well without aggravation of symptoms.    Clinical impression Decreased R low back pain in sitting with treatment to decrease low back extension stress, improve posture through muscle use as well as improving R glute max muscle activation. Pt tolerated session well without aggravation of symptoms. Pt will benefit from continued skilled physical therapy services to  decrease pain, improve strength and function.         PT Short Term Goals - 06/13/20 1925      PT SHORT TERM GOAL #1   Title Pt will be independent with her initial HEP to decrease pain, improve ability to stand and sleep with less difficulty.    Baseline Pt has started her HEP (06/13/2020)    Time 3    Period Weeks    Status New    Target Date 07/05/20             PT Long Term Goals - 06/13/20 1928      PT LONG TERM GOAL #1   Title Pt will have a decrease in low back pain to 4/10 or less at most to promote ability to sleep as well as stand more comfortably.    Baseline 9/10 low back pain at most for the past 3 months (06/13/2020)    Time 8    Period Weeks    Status New    Target Date 08/09/20      PT LONG TERM GOAL #2   Title Pt will have a decrease in R LE pain to 3/10 or less at most to promote ability to sleep as well as stand more comfortably.    Baseline 9/10 R LE pain at most for the past 3 months (06/13/2020)    Time 8    Period Weeks    Status New    Target Date 08/09/20      PT LONG TERM GOAL #3   Title Patient will improve bilateral hip extension and abduction strength by at least 1/2 MMT grade to promote ability to perform standing tasks more comfortably.    Baseline Hip extension 4/5 R, 4-/5 L, hip abduction 4/5 R and L (06/13/2020)    Time 8    Period Weeks    Status New    Target Date 08/09/20      PT LONG TERM GOAL #4   Title Pt will improve her pelvis FOTO score by at least 10 points as a demonstration of improved function.    Baseline Pelvis FOTO 47 (06/13/2020)    Time 8    Period Weeks    Status New    Target Date 08/09/20                 Plan - 06/19/20 1730    Clinical Impression Statement Decreased R low back pain in sitting with treatment to decrease low back extension stress, improve posture through muscle use as well as improving R glute max muscle activation. Pt tolerated session well without aggravation of symptoms. Pt will  benefit from continued skilled physical therapy services  to decrease pain, improve strength and function.    Personal Factors and Comorbidities Comorbidity 3+;Age;Fitness;Time since onset of injury/illness/exacerbation    Comorbidities Arthritis, HTN, obesity    Examination-Activity Limitations Bed Mobility;Sleep;Stand    Stability/Clinical Decision Making Stable/Uncomplicated   Pain seems to be worsening based on subjective reports   Clinical Decision Making Low    Rehab Potential Fair    PT Frequency 2x / week    PT Duration 8 weeks    PT Treatment/Interventions Therapeutic activities;Therapeutic exercise;Neuromuscular re-education;Patient/family education;Manual techniques;Dry needling;Aquatic Therapy;Electrical Stimulation;Iontophoresis 4mg /ml Dexamethasone;Traction;Functional mobility training    PT Next Visit Plan posture, R hip mobility, thoracic extension, trunk and hip strength, manual techniques, modalities PRN    Consulted and Agree with Plan of Care Patient           Patient will benefit from skilled therapeutic intervention in order to improve the following deficits and impairments:  Pain,Postural dysfunction,Improper body mechanics,Decreased strength,Abnormal gait  Visit Diagnosis: Chronic right-sided low back pain with right-sided sciatica  Muscle weakness (generalized)     Problem List Patient Active Problem List   Diagnosis Date Noted  . Abdominal aortic atherosclerosis ([place]) 06/09/2020  . Hyperlipidemia associated with type 2 diabetes mellitus ([place]) 06/07/2020  . Glaucoma 06/07/2020  . Chronic sciatica, right 06/07/2020  . Tubular adenoma of colon 11/19/2018  . Digital mucinous cyst of finger 11/21/2017  . Vitamin D deficiency 03/01/2016  . Routine general medical examination at a health care facility 08/28/2015  . Medicare annual wellness visit, subsequent 10/24/2013  . CKD (chronic kidney disease) stage 3, GFR 30-59 ml/min (HCC) 10/24/2013  . Candida rash  of groin 08/09/2012  . Familial hyperlipidemia, high LDL 11/20/2011  . Obesity 12/23/2010  . Hypertension 12/23/2010    Joneen Boers PT, DPT   06/19/2020, 6:05 PM  [facility] [address] [place], [address] Phone: [phone]   Fax:  [phone]  Name: Savannah Hood MRN: [id number] Date of Birth: [birth year]

## 2020-06-19 NOTE — Patient Instructions (Addendum)
Seated hip extension isometrics   Sitting on a chair,    Squeeze your rear end muscles together and press your right foot onto the floor.     Hold for 5 seconds    Repeat 10 times   Perform 3 sets daily.      This is a corrective exercise. Once you no longer have symptoms, you can stop.      Access Code: [email or web address] URL: [email or web address] Date: 06/19/2020 Prepared by: Joneen Boers  Exercises Seated Hip Adduction Isometrics with Diona Foley - 1 x daily - 7 x weekly - 3 sets - 10 reps - 5 seconds hold Seated Piriformis Stretch - 3 x daily - 7 x weekly - 1 sets - 3 reps - 30 seconds hold

## 2020-06-26 ENCOUNTER — Other Ambulatory Visit: Payer: Self-pay

## 2020-06-26 ENCOUNTER — Ambulatory Visit: Payer: Medicare HMO | Attending: Internal Medicine

## 2020-06-26 DIAGNOSIS — G8929 Other chronic pain: Secondary | ICD-10-CM | POA: Diagnosis not present

## 2020-06-26 DIAGNOSIS — M5441 Lumbago with sciatica, right side: Secondary | ICD-10-CM | POA: Diagnosis not present

## 2020-06-26 DIAGNOSIS — M6281 Muscle weakness (generalized): Secondary | ICD-10-CM | POA: Diagnosis not present

## 2020-06-26 NOTE — Therapy (Signed)
[facility] [address], [place], [address] Phone: [phone]   Fax:  [phone]  Physical Therapy Treatment  Patient Details  Name: Savannah Hood MRN: [id number] Date of Birth: [birth year] Referring Provider (PT): Crecencio Mc, MD   Encounter Date: 06/26/2020   PT End of Session - 06/26/20 1547    Visit Number 3    Number of Visits 17    Date for PT Re-Evaluation 08/09/20    Authorization Type 3    Authorization Time Period of 10 progress report    PT Start Time 1548    PT Stop Time 1628    PT Time Calculation (min) 40 min    Activity Tolerance Patient tolerated treatment well    Behavior During Therapy [facility] for tasks assessed/performed           Past Medical History:  Diagnosis Date  . Arthritis   . Colon polyps   . Diabetes mellitus    pre-diabetes in past  . Glaucoma   . Hyperlipidemia   . Hypertension   . Obesity     Past Surgical History:  Procedure Laterality Date  . ABDOMINAL HYSTERECTOMY     total, secondary to uterine polyps, Dr. Rayford Halsted    There were no vitals filed for this visit.   Subjective Assessment - 06/26/20 1549    Subjective Back is great right now. First part of the week not so great at night. Then starting Saturday night, back has been good since then. Able to sleep 6 hours straigh without walking up now.    Pertinent History Chronic sciatica R, and LBP.  Low back pain began a year ago, gradual onset of occasional back pain. Currentlly bothers her sleeping. Denies loss of bowel or bladder control. No LE paresthesia. Tried Chiropractic treatment which did not help. Tried acupuncture which helps. The relief from the acupuncture is not immediate which has helped with her sleep. Currently only able to sleep 3 hours straight prior to her back pain waking her up.    Patient Stated Goals Sleep better.    Currently in Pain? No/denies    Pain Score 0-No pain                                      PT Education - 06/26/20 1554    Education Details ther-ex    Person(s) Educated Patient    Methods Explanation;Demonstration;Tactile cues;Verbal cues    Comprehension Returned demonstration;Verbalized understanding          Objective     Gait: trendelenberg with R lateral lean during R LE stance phase  No latex allergies Blood pressure is controlled per pt.  No longer diabetic per pt.   Pain location: R low back, posterior hip, and sciatic distribution Decreased pain with L lateral shift correction   Muscle tension B paraspinal muscles   Improved R posterior hip comfort level with R piriformis test  Medbridge Access Code: [email or web address]  Therapeutic exercise  Seated manually resisted R lateral shift isometrics in neutral to counter L lateral shift posture. 10x3 with 5 second holds   Seated B scapular retraction to promote thoracic extension 10x3 with 5 second holds   Seated thoracic extension on chair 10x5 seconds for 3 sets   Seated hip extension isometrics              R 10x3  with 5 second holds    Seated R hip IR 10x  Seated hip adduction 10x3 with 5 second holds  Seated B shoulder extension isometrics, hands on thighs 10x5 seconds for 3 sets   Seated hip adduction ball and glute max squeeze 10x5 seconds for 3 sets      Improved exercise technique, movement at target joints, use of target muscles after mod verbal, visual, tactile cues.   Response to treatment Pt tolerated session well without aggravation of symptoms.    Clinical impression Pt making good progress with sleep and back pain for the past few days, being able to sleep 6 hours straight based on subjective reports. Continued working on trunk posture, thoracic extension, trunk and glute strength as well as decreasing piriformis muscle activation to decrease pain and stress to low back. Pt tolerated session well without  aggravation of symptoms. Pt will benefit from continued skilled physical therapy services to decrease pain, improve strength and function.        PT Short Term Goals - 06/13/20 1925      PT SHORT TERM GOAL #1   Title Pt will be independent with her initial HEP to decrease pain, improve ability to stand and sleep with less difficulty.    Baseline Pt has started her HEP (06/13/2020)    Time 3    Period Weeks    Status New    Target Date 07/05/20             PT Long Term Goals - 06/13/20 1928      PT LONG TERM GOAL #1   Title Pt will have a decrease in low back pain to 4/10 or less at most to promote ability to sleep as well as stand more comfortably.    Baseline 9/10 low back pain at most for the past 3 months (06/13/2020)    Time 8    Period Weeks    Status New    Target Date 08/09/20      PT LONG TERM GOAL #2   Title Pt will have a decrease in R LE pain to 3/10 or less at most to promote ability to sleep as well as stand more comfortably.    Baseline 9/10 R LE pain at most for the past 3 months (06/13/2020)    Time 8    Period Weeks    Status New    Target Date 08/09/20      PT LONG TERM GOAL #3   Title Patient will improve bilateral hip extension and abduction strength by at least 1/2 MMT grade to promote ability to perform standing tasks more comfortably.    Baseline Hip extension 4/5 R, 4-/5 L, hip abduction 4/5 R and L (06/13/2020)    Time 8    Period Weeks    Status New    Target Date 08/09/20      PT LONG TERM GOAL #4   Title Pt will improve her pelvis FOTO score by at least 10 points as a demonstration of improved function.    Baseline Pelvis FOTO 47 (06/13/2020)    Time 8    Period Weeks    Status New    Target Date 08/09/20                 Plan - 06/26/20 1556    Clinical Impression Statement Pt making good progress with sleep and back pain for the past few days, being able to sleep 6 hours straight based on subjective  reports. Continued working on  trunk posture, thoracic extension, trunk and glute strength as well as decreasing piriformis muscle activation to decrease pain and stress to low back. Pt tolerated session well without aggravation of symptoms. Pt will benefit from continued skilled physical therapy services to decrease pain, improve strength and function.    Personal Factors and Comorbidities Comorbidity 3+;Age;Fitness;Time since onset of injury/illness/exacerbation    Comorbidities Arthritis, HTN, obesity    Examination-Activity Limitations Bed Mobility;Sleep;Stand    Stability/Clinical Decision Making Stable/Uncomplicated   Pain seems to be worsening based on subjective reports   Clinical Decision Making Low    Rehab Potential Fair    PT Frequency 2x / week    PT Duration 8 weeks    PT Treatment/Interventions Therapeutic activities;Therapeutic exercise;Neuromuscular re-education;Patient/family education;Manual techniques;Dry needling;Aquatic Therapy;Electrical Stimulation;Iontophoresis 4mg /ml Dexamethasone;Traction;Functional mobility training    PT Next Visit Plan posture, R hip mobility, thoracic extension, trunk and hip strength, manual techniques, modalities PRN    Consulted and Agree with Plan of Care Patient           Patient will benefit from skilled therapeutic intervention in order to improve the following deficits and impairments:  Pain,Postural dysfunction,Improper body mechanics,Decreased strength,Abnormal gait  Visit Diagnosis: Chronic right-sided low back pain with right-sided sciatica  Muscle weakness (generalized)     Problem List Patient Active Problem List   Diagnosis Date Noted  . Abdominal aortic atherosclerosis ([place]) 06/09/2020  . Hyperlipidemia associated with type 2 diabetes mellitus ([place]) 06/07/2020  . Glaucoma 06/07/2020  . Chronic sciatica, right 06/07/2020  . Tubular adenoma of colon 11/19/2018  . Digital mucinous cyst of finger 11/21/2017  . Vitamin D deficiency 03/01/2016  .  Routine general medical examination at a health care facility 08/28/2015  . Medicare annual wellness visit, subsequent 10/24/2013  . CKD (chronic kidney disease) stage 3, GFR 30-59 ml/min (HCC) 10/24/2013  . Candida rash of groin 08/09/2012  . Familial hyperlipidemia, high LDL 11/20/2011  . Obesity 12/23/2010  . Hypertension 12/23/2010     Joneen Boers PT, DPT  06/26/2020, 4:38 PM  [facility] [address], [place], [address] Phone: [phone]   Fax:  [phone]  Name: Savannah Hood MRN: [id number] Date of Birth: 11/18/1945

## 2020-06-28 ENCOUNTER — Other Ambulatory Visit: Payer: Self-pay

## 2020-06-28 ENCOUNTER — Ambulatory Visit: Payer: Medicare HMO

## 2020-06-28 DIAGNOSIS — M5441 Lumbago with sciatica, right side: Secondary | ICD-10-CM | POA: Diagnosis not present

## 2020-06-28 DIAGNOSIS — M6281 Muscle weakness (generalized): Secondary | ICD-10-CM | POA: Diagnosis not present

## 2020-06-28 DIAGNOSIS — G8929 Other chronic pain: Secondary | ICD-10-CM

## 2020-06-28 NOTE — Therapy (Signed)
[facility] [address] [place], [address] Phone: [phone]   Fax:  [phone]  Physical Therapy Treatment  Patient Details  Name: Savannah Hood MRN: [id number] Date of Birth: 06-Nov-1945 Referring Provider (PT): Crecencio Mc, MD   Encounter Date: 06/28/2020   PT End of Session - 06/28/20 1500    Visit Number 4    Number of Visits 17    Date for PT Re-Evaluation 08/09/20    Authorization Type 4    Authorization Time Period of 10 progress report    PT Start Time 1500    PT Stop Time 1543    PT Time Calculation (min) 43 min    Activity Tolerance Patient tolerated treatment well    Behavior During Therapy [facility] for tasks assessed/performed            Past Medical History:  Diagnosis Date  . Arthritis   . Colon polyps   . Diabetes mellitus    pre-diabetes in past  . Glaucoma   . Hyperlipidemia   . Hypertension   . Obesity     Past Surgical History:  Procedure Laterality Date  . ABDOMINAL HYSTERECTOMY     total, secondary to uterine polyps, Dr. Rayford Halsted    There were no vitals filed for this visit.   Subjective Assessment - 06/28/20 1501    Subjective Tuesday night was good, Wednesday night was horrible. Back is a C- right now. 5/10 low back pain currently which is the best it has been today.    Pertinent History Chronic sciatica R, and LBP.  Low back pain began a year ago, gradual onset of occasional back pain. Currentlly bothers her sleeping. Denies loss of bowel or bladder control. No LE paresthesia. Tried Chiropractic treatment which did not help. Tried acupuncture which helps. The relief from the acupuncture is not immediate which has helped with her sleep. Currently only able to sleep 3 hours straight prior to her back pain waking her up.    Patient Stated Goals Sleep better.    Currently in Pain? Yes    Pain Score 5                                       PT Education - 06/28/20 1508    Education Details ther-ex, HEP    Person(s) Educated Patient    Methods Explanation;Demonstration;Tactile cues;Verbal cues;Handout    Comprehension Returned demonstration;Verbalized understanding          Objective     Gait: trendelenberg with R lateral lean during R LE stance phase  No latex allergies Blood pressure is controlled per pt.  No longer diabetic per pt.   Pain location: R low back, posterior hip, and sciatic distribution Decreased pain with L lateral shift correction   Muscle tension B paraspinal muscles   Improved R posterior hip comfort level with R piriformis test  MedbridgeAccess Code: [email or web address]  Therapeutic exercise  Seated manually resisted trunk extension isometrics in neutral 10x3 with 5 second holds   Back pain improved.   Seated thoracic extension on chair 10x5 seconds for 3 sets   Seated manually resisted R lateral shift isometrics in neutral to counter L lateral shift posture. 10x3 with 5 second holds   Standing hip abduction with B UE assist with slight lumbar flexed posture  R 10x2. R low back/posterior hip discomfort  L 10x2  Standing L lumbar side bend 10x5 seconds with R UE assist  Decreased R low back pain  Seated trunk flexion to the L 10x5 seconds for 3 sets  Seated hip extension isometrics  R 10x2 with 5 second holds   Seated B shoulder extension isometrics, hands on thighs 10x5 seconds for 2 sets     Improved exercise technique, movement at target joints, use of target muscles after mod verbal, visual, tactile cues.  Response to treatment Decreased back pain to 3-4/10 after session.    Clinical impression Decreased low back pain with exercise to promote lumbar extensor muscle activation as well as lumbar flexion position/movements to increase intervertebral foraminal space on the R. Continued working on  thoracic extension as well as glute max strength and posture to decrease stress to low back when performing standing tasks. Pt tolerated session well without aggravation of symptoms. Pt will benefit from continued skilled physical therapy services to decrease pain, improve strength and function.       PT Short Term Goals - 06/13/20 1925      PT SHORT TERM GOAL #1   Title Pt will be independent with her initial HEP to decrease pain, improve ability to stand and sleep with less difficulty.    Baseline Pt has started her HEP (06/13/2020)    Time 3    Period Weeks    Status New    Target Date 07/05/20             PT Long Term Goals - 06/13/20 1928      PT LONG TERM GOAL #1   Title Pt will have a decrease in low back pain to 4/10 or less at most to promote ability to sleep as well as stand more comfortably.    Baseline 9/10 low back pain at most for the past 3 months (06/13/2020)    Time 8    Period Weeks    Status New    Target Date 08/09/20      PT LONG TERM GOAL #2   Title Pt will have a decrease in R LE pain to 3/10 or less at most to promote ability to sleep as well as stand more comfortably.    Baseline 9/10 R LE pain at most for the past 3 months (06/13/2020)    Time 8    Period Weeks    Status New    Target Date 08/09/20      PT LONG TERM GOAL #3   Title Patient will improve bilateral hip extension and abduction strength by at least 1/2 MMT grade to promote ability to perform standing tasks more comfortably.    Baseline Hip extension 4/5 R, 4-/5 L, hip abduction 4/5 R and L (06/13/2020)    Time 8    Period Weeks    Status New    Target Date 08/09/20      PT LONG TERM GOAL #4   Title Pt will improve her pelvis FOTO score by at least 10 points as a demonstration of improved function.    Baseline Pelvis FOTO 47 (06/13/2020)    Time 8    Period Weeks    Status New    Target Date 08/09/20                 Plan - 06/28/20 1500    Clinical Impression Statement  Decreased low back pain with exercise to promote lumbar extensor muscle activation as well as lumbar flexion position/movements to increase  intervertebral foraminal space on the R. Continued working on thoracic extension as well as glute max strength and posture to decrease stress to low back when performing standing tasks. Pt tolerated session well without aggravation of symptoms. Pt will benefit from continued skilled physical therapy services to decrease pain, improve strength and function.    Personal Factors and Comorbidities Comorbidity 3+;Age;Fitness;Time since onset of injury/illness/exacerbation    Comorbidities Arthritis, HTN, obesity    Examination-Activity Limitations Bed Mobility;Sleep;Stand    Stability/Clinical Decision Making Stable/Uncomplicated   Pain seems to be worsening based on subjective reports   Rehab Potential Fair    PT Frequency 2x / week    PT Duration 8 weeks    PT Treatment/Interventions Therapeutic activities;Therapeutic exercise;Neuromuscular re-education;Patient/family education;Manual techniques;Dry needling;Aquatic Therapy;Electrical Stimulation;Iontophoresis 4mg /ml Dexamethasone;Traction;Functional mobility training    PT Next Visit Plan posture, R hip mobility, thoracic extension, trunk and hip strength, manual techniques, modalities PRN    Consulted and Agree with Plan of Care Patient           Patient will benefit from skilled therapeutic intervention in order to improve the following deficits and impairments:  Pain,Postural dysfunction,Improper body mechanics,Decreased strength,Abnormal gait  Visit Diagnosis: Chronic right-sided low back pain with right-sided sciatica  Muscle weakness (generalized)     Problem List Patient Active Problem List   Diagnosis Date Noted  . Abdominal aortic atherosclerosis ([place]) 06/09/2020  . Hyperlipidemia associated with type 2 diabetes mellitus ([place]) 06/07/2020  . Glaucoma 06/07/2020  . Chronic sciatica, right  06/07/2020  . Tubular adenoma of colon 11/19/2018  . Digital mucinous cyst of finger 11/21/2017  . Vitamin D deficiency 03/01/2016  . Routine general medical examination at a health care facility 08/28/2015  . Medicare annual wellness visit, subsequent 10/24/2013  . CKD (chronic kidney disease) stage 3, GFR 30-59 ml/min (HCC) 10/24/2013  . Candida rash of groin 08/09/2012  . Familial hyperlipidemia, high LDL 11/20/2011  . Obesity 12/23/2010  . Hypertension 12/23/2010   Joneen Boers PT, DPT   06/28/2020, 3:44 PM  [facility] [address] [place], [address] Phone: [phone]   Fax:  [phone]  Name: Sheran Newstrom MRN: [id number] Date of Birth: [birth year]

## 2020-06-28 NOTE — Patient Instructions (Signed)
Access Code: [email or web address] URL: [email or web address] Date: 06/28/2020 Prepared by: Joneen Boers  Exercises Seated Hip Adduction Isometrics with Diona Foley - 1 x daily - 7 x weekly - 3 sets - 10 reps - 5 seconds hold Seated Piriformis Stretch - 3 x daily - 7 x weekly - 1 sets - 3 reps - 30 seconds hold Seated Thoracic Lumbar Extension - 1 x daily - 7 x weekly - 3 sets - 10 reps - 5 seconds hold Seated Flexion Stretch - 1 x daily - 7 x weekly - 3 sets - 10 reps - 5 seconds hold

## 2020-07-02 ENCOUNTER — Ambulatory Visit
Admission: RE | Admit: 2020-07-02 | Discharge: 2020-07-02 | Disposition: A | Discharge status: Home / Self Care | Payer: Medicare HMO | Source: Ambulatory Visit | Attending: Internal Medicine | Admitting: Internal Medicine

## 2020-07-02 ENCOUNTER — Other Ambulatory Visit: Payer: Self-pay

## 2020-07-02 DIAGNOSIS — Z780 Asymptomatic menopausal state: Secondary | ICD-10-CM

## 2020-07-03 ENCOUNTER — Ambulatory Visit: Payer: Medicare HMO

## 2020-07-03 DIAGNOSIS — G8929 Other chronic pain: Secondary | ICD-10-CM | POA: Diagnosis not present

## 2020-07-03 DIAGNOSIS — M6281 Muscle weakness (generalized): Secondary | ICD-10-CM

## 2020-07-03 DIAGNOSIS — M5441 Lumbago with sciatica, right side: Secondary | ICD-10-CM

## 2020-07-03 NOTE — Patient Instructions (Signed)
Access Code: [email or web address] URL: [email or web address] Date: 07/03/2020 Prepared by: Joneen Boers  Exercises Seated Hip Adduction Isometrics with Diona Foley - 1 x daily - 7 x weekly - 3 sets - 10 reps - 5 seconds hold Seated Piriformis Stretch - 3 x daily - 7 x weekly - 1 sets - 3 reps - 30 seconds hold Seated Thoracic Lumbar Extension - 1 x daily - 7 x weekly - 3 sets - 10 reps - 5 seconds hold Seated Flexion Stretch - 1 x daily - 7 x weekly - 3 sets - 10 reps - 5 seconds hold Supine Posterior Pelvic Tilt - 1 x daily - 7 x weekly - 3 sets - 10 reps - 5 seconds hold

## 2020-07-03 NOTE — Therapy (Signed)
[facility] [address], [place], [address] Phone: [phone]   Fax:  [phone]  Physical Therapy Treatment  Patient Details  Name: Savannah Hood MRN: [id number] Date of Birth: [birth year] Referring Provider (PT): Crecencio Mc, MD   Encounter Date: 07/03/2020   PT End of Session - 07/03/20 1416    Visit Number 5    Number of Visits 17    Date for PT Re-Evaluation 08/09/20    Authorization Type 5    Authorization Time Period of 10 progress report    PT Start Time 1416    PT Stop Time 1457    PT Time Calculation (min) 41 min    Activity Tolerance Patient tolerated treatment well    Behavior During Therapy [facility] for tasks assessed/performed           Past Medical History:  Diagnosis Date  . Arthritis   . Colon polyps   . Diabetes mellitus    pre-diabetes in past  . Glaucoma   . Hyperlipidemia   . Hypertension   . Obesity     Past Surgical History:  Procedure Laterality Date  . ABDOMINAL HYSTERECTOMY     total, secondary to uterine polyps, Dr. Rayford Halsted    There were no vitals filed for this visit.   Subjective Assessment - 07/03/20 1418    Subjective Back is great. Sleeping is better. The last 3 nights have been really good. Three nights in a row is amazing.    Pertinent History Chronic sciatica R, and LBP.  Low back pain began a year ago, gradual onset of occasional back pain. Currentlly bothers her sleeping. Denies loss of bowel or bladder control. No LE paresthesia. Tried Chiropractic treatment which did not help. Tried acupuncture which helps. The relief from the acupuncture is not immediate which has helped with her sleep. Currently only able to sleep 3 hours straight prior to her back pain waking her up.    Patient Stated Goals Sleep better.    Currently in Pain? No/denies                                     PT Education - 07/03/20 1420    Education  Details ther-ex    Person(s) Educated Patient    Methods Explanation;Demonstration;Tactile cues;Verbal cues    Comprehension Returned demonstration;Verbalized understanding          Objective     Gait: trendelenberg with R lateral lean during R LE stance phase  No latex allergies Blood pressure is controlled per pt.  No longer diabetic per pt.   Pain location: R low back, posterior hip, and sciatic distribution Decreased pain with L lateral shift correction   Muscle tension B paraspinal muscles   Improved R posterior hip comfort level with R piriformis test  MedbridgeAccess Code: [email or web address]  Therapeutic exercise  Seated manually resisted trunk extension isometrics in neutral 10x3 with 5 second holds               Seated manually resisted R lateral shift isometrics in neutral to counter L lateral shift posture. 10x3 with 5 second holds   Seated B scapular retraction 10x5 seconds   Seated thoracic extension on chair 10x5 seconds for 2 sets  Standing hip abduction with B UE assist with slight lumbar flexed posture  R 10x3             L 10x3  Standing L lumbar side bend 10x5 seconds with R UE assist   Seated hip extension isometrics  R 10x with 10 second holds  Seated trunk flexion to the L 10x10 seconds   hooklying posterior pelvic tilt 10x5 seconds for 3 sets  hooklying clamshell blue band 10x3  Seated B shoulder extension isometrics, hands on thighs 10x5 seconds    Improved exercise technique, movement at target joints, use of target muscles after mod verbal, visual, tactile cues.  Response to treatment Pt tolerated session well without aggravation of symptoms.     Clinical impression Good carry over of decreased low back pain as well as improved sleep based on subjective reports. Continued working on decreasing extension stress to low back. Good muscle use felt with exercises. Pt will benefit from continued  skilled physical therapy services to decrease pain, improve strength, posture, and function.         PT Short Term Goals - 06/13/20 1925      PT SHORT TERM GOAL #1   Title Pt will be independent with her initial HEP to decrease pain, improve ability to stand and sleep with less difficulty.    Baseline Pt has started her HEP (06/13/2020)    Time 3    Period Weeks    Status New    Target Date 07/05/20             PT Long Term Goals - 06/13/20 1928      PT LONG TERM GOAL #1   Title Pt will have a decrease in low back pain to 4/10 or less at most to promote ability to sleep as well as stand more comfortably.    Baseline 9/10 low back pain at most for the past 3 months (06/13/2020)    Time 8    Period Weeks    Status New    Target Date 08/09/20      PT LONG TERM GOAL #2   Title Pt will have a decrease in R LE pain to 3/10 or less at most to promote ability to sleep as well as stand more comfortably.    Baseline 9/10 R LE pain at most for the past 3 months (06/13/2020)    Time 8    Period Weeks    Status New    Target Date 08/09/20      PT LONG TERM GOAL #3   Title Patient will improve bilateral hip extension and abduction strength by at least 1/2 MMT grade to promote ability to perform standing tasks more comfortably.    Baseline Hip extension 4/5 R, 4-/5 L, hip abduction 4/5 R and L (06/13/2020)    Time 8    Period Weeks    Status New    Target Date 08/09/20      PT LONG TERM GOAL #4   Title Pt will improve her pelvis FOTO score by at least 10 points as a demonstration of improved function.    Baseline Pelvis FOTO 47 (06/13/2020)    Time 8    Period Weeks    Status New    Target Date 08/09/20                 Plan - 07/03/20 1421    Clinical Impression Statement Good carry over of decreased low back pain as well as improved sleep based on subjective reports. Continued working on decreasing extension stress  to low back. Good muscle use felt with exercises. Pt  will benefit from continued skilled physical therapy services to decrease pain, improve strength, posture, and function.    Personal Factors and Comorbidities Comorbidity 3+;Age;Fitness;Time since onset of injury/illness/exacerbation    Comorbidities Arthritis, HTN, obesity    Examination-Activity Limitations Bed Mobility;Sleep;Stand    Stability/Clinical Decision Making Stable/Uncomplicated   Pain seems to be worsening based on subjective reports   Clinical Decision Making Low    Rehab Potential Fair    PT Frequency 2x / week    PT Duration 8 weeks    PT Treatment/Interventions Therapeutic activities;Therapeutic exercise;Neuromuscular re-education;Patient/family education;Manual techniques;Dry needling;Aquatic Therapy;Electrical Stimulation;Iontophoresis 4mg /ml Dexamethasone;Traction;Functional mobility training    PT Next Visit Plan posture, R hip mobility, thoracic extension, trunk and hip strength, manual techniques, modalities PRN    Consulted and Agree with Plan of Care Patient           Patient will benefit from skilled therapeutic intervention in order to improve the following deficits and impairments:  Pain,Postural dysfunction,Improper body mechanics,Decreased strength,Abnormal gait  Visit Diagnosis: Chronic right-sided low back pain with right-sided sciatica  Muscle weakness (generalized)     Problem List Patient Active Problem List   Diagnosis Date Noted  . Abdominal aortic atherosclerosis ([place]) 06/09/2020  . Hyperlipidemia associated with type 2 diabetes mellitus ([place]) 06/07/2020  . Glaucoma 06/07/2020  . Chronic sciatica, right 06/07/2020  . Tubular adenoma of colon 11/19/2018  . Digital mucinous cyst of finger 11/21/2017  . Vitamin D deficiency 03/01/2016  . Routine general medical examination at a health care facility 08/28/2015  . Medicare annual wellness visit, subsequent 10/24/2013  . CKD (chronic kidney disease) stage 3, GFR 30-59 ml/min (HCC) 10/24/2013  .  Candida rash of groin 08/09/2012  . Familial hyperlipidemia, high LDL 11/20/2011  . Obesity 12/23/2010  . Hypertension 12/23/2010   Joneen Boers PT, DPT   07/03/2020, 3:01 PM  [facility] [address] [place], [address] Phone: [phone]   Fax:  [phone]  Name: Violet Seabury MRN: [id number] Date of Birth: [birth year]

## 2020-07-05 ENCOUNTER — Other Ambulatory Visit: Payer: Self-pay

## 2020-07-05 ENCOUNTER — Ambulatory Visit: Payer: Medicare HMO

## 2020-07-05 DIAGNOSIS — G8929 Other chronic pain: Secondary | ICD-10-CM

## 2020-07-05 DIAGNOSIS — M5441 Lumbago with sciatica, right side: Secondary | ICD-10-CM | POA: Diagnosis not present

## 2020-07-05 DIAGNOSIS — M6281 Muscle weakness (generalized): Secondary | ICD-10-CM

## 2020-07-05 NOTE — Patient Instructions (Signed)
Access Code: [email or web address] URL: [email or web address] Date: 07/05/2020 Prepared by: Joneen Boers  Exercises Seated Hip Adduction Isometrics with Diona Foley - 1 x daily - 7 x weekly - 3 sets - 10 reps - 5 seconds hold Seated Piriformis Stretch - 3 x daily - 7 x weekly - 1 sets - 3 reps - 30 seconds hold Seated Thoracic Lumbar Extension - 1 x daily - 7 x weekly - 3 sets - 10 reps - 5 seconds hold Seated Flexion Stretch - 1 x daily - 7 x weekly - 3 sets - 10 reps - 5 seconds hold Supine Posterior Pelvic Tilt - 1 x daily - 7 x weekly - 3 sets - 10 reps - 5 seconds hold Hooklying Clamshell with Resistance - 1 x daily - 7 x weekly - 3 sets - 10 reps

## 2020-07-05 NOTE — Therapy (Signed)
[place] [facility] [address] [place], [address] Phone: [phone]   Fax:  [phone]  Physical Therapy Treatment  Patient Details  Name: Savannah Hood MRN: [id number] Date of Birth: 18-Dec-1945 Referring Provider (PT): Crecencio Mc, MD   Encounter Date: 07/05/2020   PT End of Session - 07/05/20 1505    Visit Number 6    Number of Visits 17    Date for PT Re-Evaluation 08/09/20    Authorization Type 6    Authorization Time Period of 10 progress report    PT Start Time 1505    PT Stop Time 8841    PT Time Calculation (min) 39 min    Activity Tolerance Patient tolerated treatment well    Behavior During Therapy [facility] for tasks assessed/performed           Past Medical History:  Diagnosis Date  . Arthritis   . Colon polyps   . Diabetes mellitus    pre-diabetes in past  . Glaucoma   . Hyperlipidemia   . Hypertension   . Obesity     Past Surgical History:  Procedure Laterality Date  . ABDOMINAL HYSTERECTOMY     total, secondary to uterine polyps, Dr. Rayford Halsted    There were no vitals filed for this visit.   Subjective Assessment - 07/05/20 1506    Subjective Back is very good. Has been since last session.    Pertinent History Chronic sciatica R, and LBP.  Low back pain began a year ago, gradual onset of occasional back pain. Currentlly bothers her sleeping. Denies loss of bowel or bladder control. No LE paresthesia. Tried Chiropractic treatment which did not help. Tried acupuncture which helps. The relief from the acupuncture is not immediate which has helped with her sleep. Currently only able to sleep 3 hours straight prior to her back pain waking her up.    Patient Stated Goals Sleep better.    Currently in Pain? No/denies                                     PT Education - 07/05/20 1519    Education Details ther-ex    Person(s) Educated Patient    Methods  Explanation;Demonstration;Tactile cues;Verbal cues;Handout    Comprehension Returned demonstration;Verbalized understanding           Objective     Gait: trendelenberg with R lateral lean during R LE stance phase  No latex allergies Blood pressure is controlled per pt.  No longer diabetic per pt.   Pain location: R low back, posterior hip, and sciatic distribution Decreased pain with L lateral shift correction   Muscle tension B paraspinal muscles   Improved R posterior hip comfort level with R piriformis test  MedbridgeAccess Code: [email or web address]  Therapeutic exercise  Seated manually resisted trunk extension isometrics in neutral 10x3 with 5 second holds   Seated manually resisted R lateral shift isometrics in neutral to counter L lateral shift posture. 10x3 with 5 second holds   hooklying clamshell blue band 10x3  hooklying posterior pelvic tilt 10x5 seconds for 2 sets  Bridge 10x3  S/L hip abduction   R 10x2  L 10x2  Seated trunk flexion isometrics in neutral 10x5 seconds for 3 sets   Seated B shoulder extension isometrics, hands on thighs 10x5 seconds for 2 sets    Improved exercise technique, movement at  target joints, use of target muscles after mod verbal, visual, tactile cues.  Response to treatment Pt tolerated session well without aggravation of symptoms.     Clinical impression Pt making very good progress with decreased back pain based on subjective reports. Continued working on improving posture, trunk, and glute strength to decrease stress to low back. Pt tolerated session well without aggravation of symptoms. Pt will benefit from continued skilled physical therapy services to decrease pain, improve strength and function.       PT Short Term Goals - 06/13/20 1925      PT SHORT TERM GOAL #1   Title Pt will be independent with her initial HEP to decrease pain, improve ability to stand and sleep with less  difficulty.    Baseline Pt has started her HEP (06/13/2020)    Time 3    Period Weeks    Status New    Target Date 07/05/20             PT Long Term Goals - 06/13/20 1928      PT LONG TERM GOAL #1   Title Pt will have a decrease in low back pain to 4/10 or less at most to promote ability to sleep as well as stand more comfortably.    Baseline 9/10 low back pain at most for the past 3 months (06/13/2020)    Time 8    Period Weeks    Status New    Target Date 08/09/20      PT LONG TERM GOAL #2   Title Pt will have a decrease in R LE pain to 3/10 or less at most to promote ability to sleep as well as stand more comfortably.    Baseline 9/10 R LE pain at most for the past 3 months (06/13/2020)    Time 8    Period Weeks    Status New    Target Date 08/09/20      PT LONG TERM GOAL #3   Title Patient will improve bilateral hip extension and abduction strength by at least 1/2 MMT grade to promote ability to perform standing tasks more comfortably.    Baseline Hip extension 4/5 R, 4-/5 L, hip abduction 4/5 R and L (06/13/2020)    Time 8    Period Weeks    Status New    Target Date 08/09/20      PT LONG TERM GOAL #4   Title Pt will improve her pelvis FOTO score by at least 10 points as a demonstration of improved function.    Baseline Pelvis FOTO 47 (06/13/2020)    Time 8    Period Weeks    Status New    Target Date 08/09/20                 Plan - 07/05/20 1519    Clinical Impression Statement Pt making very good progress with decreased back pain based on subjective reports. Continued working on improving posture, trunk, and glute strength to decrease stress to low back. Pt tolerated session well without aggravation of symptoms. Pt will benefit from continued skilled physical therapy services to decrease pain, improve strength and function.    Personal Factors and Comorbidities Comorbidity 3+;Age;Fitness;Time since onset of injury/illness/exacerbation    Comorbidities  Arthritis, HTN, obesity    Examination-Activity Limitations Bed Mobility;Sleep;Stand    Stability/Clinical Decision Making Stable/Uncomplicated   Pain seems to be worsening based on subjective reports   Rehab Potential Fair    PT Frequency  2x / week    PT Duration 8 weeks    PT Treatment/Interventions Therapeutic activities;Therapeutic exercise;Neuromuscular re-education;Patient/family education;Manual techniques;Dry needling;Aquatic Therapy;Electrical Stimulation;Iontophoresis 4mg /ml Dexamethasone;Traction;Functional mobility training    PT Next Visit Plan posture, R hip mobility, thoracic extension, trunk and hip strength, manual techniques, modalities PRN    Consulted and Agree with Plan of Care Patient           Patient will benefit from skilled therapeutic intervention in order to improve the following deficits and impairments:  Pain,Postural dysfunction,Improper body mechanics,Decreased strength,Abnormal gait  Visit Diagnosis: Chronic right-sided low back pain with right-sided sciatica  Muscle weakness (generalized)     Problem List Patient Active Problem List   Diagnosis Date Noted  . Abdominal aortic atherosclerosis ([place]) 06/09/2020  . Hyperlipidemia associated with type 2 diabetes mellitus ([place]) 06/07/2020  . Glaucoma 06/07/2020  . Chronic sciatica, right 06/07/2020  . Tubular adenoma of colon 11/19/2018  . Digital mucinous cyst of finger 11/21/2017  . Vitamin D deficiency 03/01/2016  . Routine general medical examination at a health care facility 08/28/2015  . Medicare annual wellness visit, subsequent 10/24/2013  . CKD (chronic kidney disease) stage 3, GFR 30-59 ml/min (HCC) 10/24/2013  . Candida rash of groin 08/09/2012  . Familial hyperlipidemia, high LDL 11/20/2011  . Obesity 12/23/2010  . Hypertension 12/23/2010   Joneen Boers PT, DPT   07/05/2020, 3:46 PM  [place] [facility] [address], [place], [address] Phone: [phone]   Fax:  [phone]  Name: Neria Procter MRN: [id number] Date of Birth: 08-12-1945

## 2020-07-10 ENCOUNTER — Ambulatory Visit: Payer: Medicare HMO

## 2020-07-10 ENCOUNTER — Other Ambulatory Visit: Payer: Self-pay

## 2020-07-10 DIAGNOSIS — M5441 Lumbago with sciatica, right side: Secondary | ICD-10-CM | POA: Diagnosis not present

## 2020-07-10 DIAGNOSIS — G8929 Other chronic pain: Secondary | ICD-10-CM

## 2020-07-10 DIAGNOSIS — M6281 Muscle weakness (generalized): Secondary | ICD-10-CM

## 2020-07-10 NOTE — Therapy (Signed)
[place] [facility] [address], [place], [address] Phone: [phone]   Fax:  [phone]  Physical Therapy Treatment  Patient Details  Name: Savannah Hood MRN: [id number] Date of Birth: 04/22/1945 Referring Provider (PT): Crecencio Mc, MD   Encounter Date: 07/10/2020   PT End of Session - 07/10/20 1417    Visit Number 7    Number of Visits 17    Date for PT Re-Evaluation 08/09/20    Authorization Type 7    Authorization Time Period of 10 progress report    PT Start Time 1417    PT Stop Time 1501    PT Time Calculation (min) 44 min    Activity Tolerance Patient tolerated treatment well    Behavior During Therapy [facility] for tasks assessed/performed           Past Medical History:  Diagnosis Date  . Arthritis   . Colon polyps   . Diabetes mellitus    pre-diabetes in past  . Glaucoma   . Hyperlipidemia   . Hypertension   . Obesity     Past Surgical History:  Procedure Laterality Date  . ABDOMINAL HYSTERECTOMY     total, secondary to uterine polyps, Dr. Rayford Halsted    There were no vitals filed for this visit.   Subjective Assessment - 07/10/20 1418    Subjective Back is pretty well, not quite as well as last time but still pretty good. 1/10 currently, 3/10 at worst for the past 7 days. 3/10 R LE pain at most for the past 7 days. Has been sleeping well.    Pertinent History Chronic sciatica R, and LBP.  Low back pain began a year ago, gradual onset of occasional back pain. Currentlly bothers her sleeping. Denies loss of bowel or bladder control. No LE paresthesia. Tried Chiropractic treatment which did not help. Tried acupuncture which helps. The relief from the acupuncture is not immediate which has helped with her sleep. Currently only able to sleep 3 hours straight prior to her back pain waking her up.    Patient Stated Goals Sleep better.    Currently in Pain? Yes    Pain Score 1                [facility] PT Assessment - 07/10/20 1508      Observation/Other Assessments   Focus on Therapeutic Outcomes (FOTO)  Pelvic FOTO 69                                 PT Education - 07/10/20 1423    Education Details ther-ex    Person(s) Educated Patient    Methods Explanation;Demonstration;Tactile cues;Verbal cues    Comprehension Returned demonstration;Verbalized understanding           Objective   Gait: trendelenberg with R lateral lean during R LE stance phase  No latex allergies Blood pressure is controlled per pt.  No longer diabetic per pt.   Pain location: R low back, posterior hip, and sciatic distribution Decreased pain with L lateral shift correction   Muscle tension B paraspinal muscles    Improved R posterior hip comfort level with R piriformis test  MedbridgeAccess Code: [email or web address]  Therapeutic exercise Seated manually resisted trunk extension isometrics in neutral 10x3 with 5 second holds   Seated manually resisted R lateral shift isometrics in neutral to counter L lateral shift posture. 10x3 with  5 second holds    Seated B shoulder extension isometrics, hands on thighs 10x5 seconds   Lower trunk rotation supine 10x3  hooklying posterior pelvic tilt 10x5 seconds for 3 sets  Supine hip extension isometrics with leg straight   R 10x5 seconds for 3 sets   L 10x5 seconds for 3 sets  Seated manually resisted trunk flexion isometrics with PT manual resistance 10x2 with 5 second holds    Improved exercise technique, movement at target joints, use of target muscles after mod verbal, visual, tactile cues.  Response to treatment Pt tolerated session well without aggravation of symptoms.   Clinical impression Pt making very good progress with decrease low back and R LE pain with worst pain of 3/10 for both at most for the past 7 days. Continued working on posture, trunk mobility, trunk and hip  strength to decrease stress to low back. Pt tolerated session well without aggravation of symptoms. Pt will benefit from continued skilled physical therapy services to decrease pain, improve strength and function.        PT Short Term Goals - 06/13/20 1925      PT SHORT TERM GOAL #1   Title Pt will be independent with her initial HEP to decrease pain, improve ability to stand and sleep with less difficulty.    Baseline Pt has started her HEP (06/13/2020)    Time 3    Period Weeks    Status New    Target Date 07/05/20             PT Long Term Goals - 06/13/20 1928      PT LONG TERM GOAL #1   Title Pt will have a decrease in low back pain to 4/10 or less at most to promote ability to sleep as well as stand more comfortably.    Baseline 9/10 low back pain at most for the past 3 months (06/13/2020)    Time 8    Period Weeks    Status New    Target Date 08/09/20      PT LONG TERM GOAL #2   Title Pt will have a decrease in R LE pain to 3/10 or less at most to promote ability to sleep as well as stand more comfortably.    Baseline 9/10 R LE pain at most for the past 3 months (06/13/2020)    Time 8    Period Weeks    Status New    Target Date 08/09/20      PT LONG TERM GOAL #3   Title Patient will improve bilateral hip extension and abduction strength by at least 1/2 MMT grade to promote ability to perform standing tasks more comfortably.    Baseline Hip extension 4/5 R, 4-/5 L, hip abduction 4/5 R and L (06/13/2020)    Time 8    Period Weeks    Status New    Target Date 08/09/20      PT LONG TERM GOAL #4   Title Pt will improve her pelvis FOTO score by at least 10 points as a demonstration of improved function.    Baseline Pelvis FOTO 47 (06/13/2020)    Time 8    Period Weeks    Status New    Target Date 08/09/20                 Plan - 07/10/20 1430    Clinical Impression Statement Pt making very good progress with decrease low back and R LE pain  with worst pain  of 3/10 for both at most for the past 7 days. Continued working on posture, trunk mobility, trunk and hip strength to decrease stress to low back. Pt tolerated session well without aggravation of symptoms. Pt will benefit from continued skilled physical therapy services to decrease pain, improve strength and function.    Personal Factors and Comorbidities Comorbidity 3+;Age;Fitness;Time since onset of injury/illness/exacerbation    Comorbidities Arthritis, HTN, obesity    Examination-Activity Limitations Bed Mobility;Sleep;Stand    Stability/Clinical Decision Making Stable/Uncomplicated   Pain seems to be worsening based on subjective reports   Clinical Decision Making Low    Rehab Potential Fair    PT Frequency 2x / week    PT Duration 8 weeks    PT Treatment/Interventions Therapeutic activities;Therapeutic exercise;Neuromuscular re-education;Patient/family education;Manual techniques;Dry needling;Aquatic Therapy;Electrical Stimulation;Iontophoresis 4mg /ml Dexamethasone;Traction;Functional mobility training    PT Next Visit Plan posture, R hip mobility, thoracic extension, trunk and hip strength, manual techniques, modalities PRN    Consulted and Agree with Plan of Care Patient           Patient will benefit from skilled therapeutic intervention in order to improve the following deficits and impairments:  Pain,Postural dysfunction,Improper body mechanics,Decreased strength,Abnormal gait  Visit Diagnosis: Chronic right-sided low back pain with right-sided sciatica  Muscle weakness (generalized)     Problem List Patient Active Problem List   Diagnosis Date Noted  . Abdominal aortic atherosclerosis ([place]) 06/09/2020  . Hyperlipidemia associated with type 2 diabetes mellitus ([place]) 06/07/2020  . Glaucoma 06/07/2020  . Chronic sciatica, right 06/07/2020  . Tubular adenoma of colon 11/19/2018  . Digital mucinous cyst of finger 11/21/2017  . Vitamin D deficiency 03/01/2016  . Routine  general medical examination at a health care facility 08/28/2015  . Medicare annual wellness visit, subsequent 10/24/2013  . CKD (chronic kidney disease) stage 3, GFR 30-59 ml/min (HCC) 10/24/2013  . Candida rash of groin 08/09/2012  . Familial hyperlipidemia, high LDL 11/20/2011  . Obesity 12/23/2010  . Hypertension 12/23/2010    Joneen Boers PT, DPT   07/10/2020, 3:15 PM  [place] [facility] [address] [place], [address] Phone: [phone]   Fax:  [phone]  Name: Savannah Hood MRN: [id number] Date of Birth: 1945-06-23

## 2020-07-12 ENCOUNTER — Ambulatory Visit: Payer: Medicare HMO

## 2020-07-12 ENCOUNTER — Other Ambulatory Visit: Payer: Self-pay

## 2020-07-12 DIAGNOSIS — G8929 Other chronic pain: Secondary | ICD-10-CM

## 2020-07-12 DIAGNOSIS — M5441 Lumbago with sciatica, right side: Secondary | ICD-10-CM | POA: Diagnosis not present

## 2020-07-12 DIAGNOSIS — M6281 Muscle weakness (generalized): Secondary | ICD-10-CM

## 2020-07-12 NOTE — Therapy (Signed)
[place] [facility] [address], [place], [address] Phone: [phone]   Fax:  [phone]  Physical Therapy Treatment  Patient Details  Name: Savannah Hood MRN: [id number] Date of Birth: 1946-01-13 Referring Provider (PT): Crecencio Mc, MD   Encounter Date: 07/12/2020   PT End of Session - 07/12/20 1504    Visit Number 8    Number of Visits 17    Date for PT Re-Evaluation 08/09/20    Authorization Type 8    Authorization Time Period of 10 progress report    PT Start Time 1505    PT Stop Time 2355    PT Time Calculation (min) 39 min    Activity Tolerance Patient tolerated treatment well    Behavior During Therapy [facility] for tasks assessed/performed           Past Medical History:  Diagnosis Date  . Arthritis   . Colon polyps   . Diabetes mellitus    pre-diabetes in past  . Glaucoma   . Hyperlipidemia   . Hypertension   . Obesity     Past Surgical History:  Procedure Laterality Date  . ABDOMINAL HYSTERECTOMY     total, secondary to uterine polyps, Dr. Rayford Halsted    There were no vitals filed for this visit.   Subjective Assessment - 07/12/20 1506    Subjective Good sleep past 2 nights. Back is good.    Pertinent History Chronic sciatica R, and LBP.  Low back pain began a year ago, gradual onset of occasional back pain. Currentlly bothers her sleeping. Denies loss of bowel or bladder control. No LE paresthesia. Tried Chiropractic treatment which did not help. Tried acupuncture which helps. The relief from the acupuncture is not immediate which has helped with her sleep. Currently only able to sleep 3 hours straight prior to her back pain waking her up.    Patient Stated Goals Sleep better.    Currently in Pain? No/denies    Pain Score 0-No pain                                     PT Education - 07/12/20 1510    Education Details ther-ex    Person(s) Educated  Patient    Methods Explanation;Demonstration;Tactile cues;Verbal cues    Comprehension Returned demonstration;Verbalized understanding          Objective   Gait: trendelenberg with R lateral lean during R LE stance phase  No latex allergies Blood pressure is controlled per pt.  No longer diabetic per pt.   Pain location: R low back, posterior hip, and sciatic distribution Decreased pain with L lateral shift correction   Muscle tension B paraspinal muscles    Improved R posterior hip comfort level with R piriformis test  MedbridgeAccess Code: [email or web address]  Therapeutic exercise  L S/L (to decrease L lateral shift posture)  R hip abduction 10x3   R clamshell 10x3  Lower trunk rotation supine 10x3  Bridge 10x3  hooklying bent knee fallout 10x2  Supine hip extension isometrics with leg straight              R 10x5 seconds for 3 sets              L 10x5 seconds for 3 sets  Seated manually resisted trunk extension isometrics in neutral 10x3 with 5 second holds   Seated manually  resisted R lateral shift isometrics in neutral to counter L lateral shift posture. 10x with 5 second holds     Improved exercise technique, movement at target joints, use of target muscles after mod verbal, visual, tactile cues.  Response to treatment Pt tolerated session well without aggravation of symptoms.   Clinical impression Pt doing very well with PT with decreased back pain based on subjective reports. Continued working on decreasing lateral shift, improving trunk and glute med, and max strength to help decrease stress to low back. Pt tolerated session well without aggravation of symptoms. Pt will benefit from continued skilled physical therapy services to decrease pain, improve strength and function.      PT Short Term Goals - 06/13/20 1925      PT SHORT TERM GOAL #1   Title Pt will be independent with her initial HEP to decrease pain, improve ability to stand and  sleep with less difficulty.    Baseline Pt has started her HEP (06/13/2020)    Time 3    Period Weeks    Status New    Target Date 07/05/20             PT Long Term Goals - 06/13/20 1928      PT LONG TERM GOAL #1   Title Pt will have a decrease in low back pain to 4/10 or less at most to promote ability to sleep as well as stand more comfortably.    Baseline 9/10 low back pain at most for the past 3 months (06/13/2020)    Time 8    Period Weeks    Status New    Target Date 08/09/20      PT LONG TERM GOAL #2   Title Pt will have a decrease in R LE pain to 3/10 or less at most to promote ability to sleep as well as stand more comfortably.    Baseline 9/10 R LE pain at most for the past 3 months (06/13/2020)    Time 8    Period Weeks    Status New    Target Date 08/09/20      PT LONG TERM GOAL #3   Title Patient will improve bilateral hip extension and abduction strength by at least 1/2 MMT grade to promote ability to perform standing tasks more comfortably.    Baseline Hip extension 4/5 R, 4-/5 L, hip abduction 4/5 R and L (06/13/2020)    Time 8    Period Weeks    Status New    Target Date 08/09/20      PT LONG TERM GOAL #4   Title Pt will improve her pelvis FOTO score by at least 10 points as a demonstration of improved function.    Baseline Pelvis FOTO 47 (06/13/2020)    Time 8    Period Weeks    Status New    Target Date 08/09/20                 Plan - 07/12/20 1539    Clinical Impression Statement Pt doing very well with PT with decreased back pain based on subjective reports. Continued working on decreasing lateral shift, improving trunk and glute med, and max strength to help decrease stress to low back. Pt tolerated session well without aggravation of symptoms. Pt will benefit from continued skilled physical therapy services to decrease pain, improve strength and function.    Personal Factors and Comorbidities Comorbidity 3+;Age;Fitness;Time since onset of  injury/illness/exacerbation    Comorbidities  Arthritis, HTN, obesity    Examination-Activity Limitations Bed Mobility;Sleep;Stand    Stability/Clinical Decision Making Stable/Uncomplicated   Pain seems to be worsening based on subjective reports   Clinical Decision Making Low    Rehab Potential Fair    PT Frequency 2x / week    PT Duration 8 weeks    PT Treatment/Interventions Therapeutic activities;Therapeutic exercise;Neuromuscular re-education;Patient/family education;Manual techniques;Dry needling;Aquatic Therapy;Electrical Stimulation;Iontophoresis 4mg /ml Dexamethasone;Traction;Functional mobility training    PT Next Visit Plan posture, R hip mobility, thoracic extension, trunk and hip strength, manual techniques, modalities PRN    Consulted and Agree with Plan of Care Patient           Patient will benefit from skilled therapeutic intervention in order to improve the following deficits and impairments:  Pain,Postural dysfunction,Improper body mechanics,Decreased strength,Abnormal gait  Visit Diagnosis: Chronic right-sided low back pain with right-sided sciatica  Muscle weakness (generalized)     Problem List Patient Active Problem List   Diagnosis Date Noted  . Abdominal aortic atherosclerosis ([place]) 06/09/2020  . Hyperlipidemia associated with type 2 diabetes mellitus ([place]) 06/07/2020  . Glaucoma 06/07/2020  . Chronic sciatica, right 06/07/2020  . Tubular adenoma of colon 11/19/2018  . Digital mucinous cyst of finger 11/21/2017  . Vitamin D deficiency 03/01/2016  . Routine general medical examination at a health care facility 08/28/2015  . Medicare annual wellness visit, subsequent 10/24/2013  . CKD (chronic kidney disease) stage 3, GFR 30-59 ml/min (HCC) 10/24/2013  . Candida rash of groin 08/09/2012  . Familial hyperlipidemia, high LDL 11/20/2011  . Obesity 12/23/2010  . Hypertension 12/23/2010    Joneen Boers PT, DPT   07/12/2020, 4:56 PM  [place] [facility] [address], [place], [address] Phone: [phone]   Fax:  [phone]  Name: Savannah Hood MRN: [id number] Date of Birth: [birth year]

## 2020-07-17 ENCOUNTER — Other Ambulatory Visit: Payer: Self-pay

## 2020-07-17 ENCOUNTER — Ambulatory Visit: Payer: Medicare HMO

## 2020-07-17 DIAGNOSIS — G8929 Other chronic pain: Secondary | ICD-10-CM

## 2020-07-17 DIAGNOSIS — M6281 Muscle weakness (generalized): Secondary | ICD-10-CM

## 2020-07-17 DIAGNOSIS — M5441 Lumbago with sciatica, right side: Secondary | ICD-10-CM | POA: Diagnosis not present

## 2020-07-17 NOTE — Therapy (Signed)
[place] [facility] [address] [place], [address] Phone: [phone]   Fax:  [phone]  Physical Therapy Treatment  Patient Details  Name: Savannah Hood MRN: [id number] Date of Birth: [birth year] Referring Provider (PT): Crecencio Mc, MD   Encounter Date: 07/17/2020   PT End of Session - 07/17/20 1418    Visit Number 9    Number of Visits 17    Date for PT Re-Evaluation 08/09/20    Authorization Type 9    Authorization Time Period of 10 progress report    PT Start Time 1418    PT Stop Time 1502    PT Time Calculation (min) 44 min    Activity Tolerance Patient tolerated treatment well    Behavior During Therapy [facility] for tasks assessed/performed           Past Medical History:  Diagnosis Date  . Arthritis   . Colon polyps   . Diabetes mellitus    pre-diabetes in past  . Glaucoma   . Hyperlipidemia   . Hypertension   . Obesity     Past Surgical History:  Procedure Laterality Date  . ABDOMINAL HYSTERECTOMY     total, secondary to uterine polyps, Dr. Rayford Halsted    There were no vitals filed for this visit.   Subjective Assessment - 07/17/20 1419    Subjective Woke up with a fair amount of pain. Took tylenol and moved around. Ok right now. Does not know why, pt slept well. Pt woke up on her L side. 2/10 R low back pain currently. No R LE pain currently. Had R LE tingly feeling this morning along the sciatic neve which went away quickly when pt got up.    Pertinent History Chronic sciatica R, and LBP.  Low back pain began a year ago, gradual onset of occasional back pain. Currentlly bothers her sleeping. Denies loss of bowel or bladder control. No LE paresthesia. Tried Chiropractic treatment which did not help. Tried acupuncture which helps. The relief from the acupuncture is not immediate which has helped with her sleep. Currently only able to sleep 3 hours straight prior to her back pain waking her  up.    Patient Stated Goals Sleep better.    Currently in Pain? Yes    Pain Score 2     Pain Location Back    Pain Orientation Right;Lower                                     PT Education - 07/17/20 1455    Education Details ther-ex    Person(s) Educated Patient    Methods Explanation;Demonstration;Tactile cues;Verbal cues    Comprehension Returned demonstration;Verbalized understanding           Objective   Gait: trendelenberg with R lateral lean during R LE stance phase   No latex allergies Blood pressure is controlled per pt.  No longer diabetic per pt.   Pain location: R low back, posterior hip, and sciatic distribution Decreased pain with L lateral shift correction   Muscle tension B paraspinal muscles    Improved R posterior hip comfort level with R piriformis test  MedbridgeAccess Code: [email or web address]  Therapeutic exercise  Seated hip extension isometrics   L 10x3 with 5 second holds  Decreased R low back pain afterwards.  Seated shoulder extension isometrics, hand on thigh  R 10x5 seconds  for 2 sets (decreased L lateral shift lumbar spine)   Then B hands 10x5 seconds for 2 sets   Seated trunk flexion to the L 10x10 seconds  Static mini lunge   L LE with one UE assist 2x. L knee joint discomfort   Seated manually resisted hip extension with PT   L 10x3   Bridge 10x3  L S/L (to decrease L lateral shift posture)             R hip abduction 10x3              R clamshell 10x3  hooklying posterior pelvic tilt 10x5 seconds for 2 sets    Improved exercise technique, movement at target joints, use of target muscles after mod verbal, visual, tactile cues.  Response to treatment No back pain after session.    Clinical impression Decreased back pain with treatment to decrease L lateral shift posture and decrease pressure to R low back. Continued working on trunk and hip strengthening to help decrease  stress to low back during standing tasks. Pt will benefit from continued skilled physical therapy services to decrease pain, improve strength and function.            PT Short Term Goals - 06/13/20 1925      PT SHORT TERM GOAL #1   Title Pt will be independent with her initial HEP to decrease pain, improve ability to stand and sleep with less difficulty.    Baseline Pt has started her HEP (06/13/2020)    Time 3    Period Weeks    Status New    Target Date 07/05/20             PT Long Term Goals - 06/13/20 1928      PT LONG TERM GOAL #1   Title Pt will have a decrease in low back pain to 4/10 or less at most to promote ability to sleep as well as stand more comfortably.    Baseline 9/10 low back pain at most for the past 3 months (06/13/2020)    Time 8    Period Weeks    Status New    Target Date 08/09/20      PT LONG TERM GOAL #2   Title Pt will have a decrease in R LE pain to 3/10 or less at most to promote ability to sleep as well as stand more comfortably.    Baseline 9/10 R LE pain at most for the past 3 months (06/13/2020)    Time 8    Period Weeks    Status New    Target Date 08/09/20      PT LONG TERM GOAL #3   Title Patient will improve bilateral hip extension and abduction strength by at least 1/2 MMT grade to promote ability to perform standing tasks more comfortably.    Baseline Hip extension 4/5 R, 4-/5 L, hip abduction 4/5 R and L (06/13/2020)    Time 8    Period Weeks    Status New    Target Date 08/09/20      PT LONG TERM GOAL #4   Title Pt will improve her pelvis FOTO score by at least 10 points as a demonstration of improved function.    Baseline Pelvis FOTO 47 (06/13/2020)    Time 8    Period Weeks    Status New    Target Date 08/09/20  Plan - 07/17/20 1455    Clinical Impression Statement Decreased back pain with treatment to decrease L lateral shift posture and decrease pressure to R low back. Continued working on  trunk and hip strengthening to help decrease stress to low back during standing tasks. Pt will benefit from continued skilled physical therapy services to decrease pain, improve strength and function.    Personal Factors and Comorbidities Comorbidity 3+;Age;Fitness;Time since onset of injury/illness/exacerbation    Comorbidities Arthritis, HTN, obesity    Examination-Activity Limitations Bed Mobility;Sleep;Stand    Stability/Clinical Decision Making Stable/Uncomplicated   Pain seems to be worsening based on subjective reports   Clinical Decision Making Low    Rehab Potential Fair    PT Frequency 2x / week    PT Duration 8 weeks    PT Treatment/Interventions Therapeutic activities;Therapeutic exercise;Neuromuscular re-education;Patient/family education;Manual techniques;Dry needling;Aquatic Therapy;Electrical Stimulation;Iontophoresis 4mg /ml Dexamethasone;Traction;Functional mobility training    PT Next Visit Plan posture, R hip mobility, thoracic extension, trunk and hip strength, manual techniques, modalities PRN    Consulted and Agree with Plan of Care Patient           Patient will benefit from skilled therapeutic intervention in order to improve the following deficits and impairments:  Pain,Postural dysfunction,Improper body mechanics,Decreased strength,Abnormal gait  Visit Diagnosis: Chronic right-sided low back pain with right-sided sciatica  Muscle weakness (generalized)     Problem List Patient Active Problem List   Diagnosis Date Noted  . Abdominal aortic atherosclerosis ([place]) 06/09/2020  . Hyperlipidemia associated with type 2 diabetes mellitus ([place]) 06/07/2020  . Glaucoma 06/07/2020  . Chronic sciatica, right 06/07/2020  . Tubular adenoma of colon 11/19/2018  . Digital mucinous cyst of finger 11/21/2017  . Vitamin D deficiency 03/01/2016  . Routine general medical examination at a health care facility 08/28/2015  . Medicare annual wellness visit, subsequent 10/24/2013   . CKD (chronic kidney disease) stage 3, GFR 30-59 ml/min (HCC) 10/24/2013  . Candida rash of groin 08/09/2012  . Familial hyperlipidemia, high LDL 11/20/2011  . Obesity 12/23/2010  . Hypertension 12/23/2010    Joneen Boers PT, DPT   07/17/2020, 5:14 PM  [place] [facility] [address] [place], [address] Phone: [phone]   Fax:  [phone]  Name: Savannah Hood MRN: [id number] Date of Birth: [birth year]

## 2020-07-19 ENCOUNTER — Ambulatory Visit: Payer: Medicare HMO

## 2020-07-19 ENCOUNTER — Other Ambulatory Visit: Payer: Self-pay

## 2020-07-19 DIAGNOSIS — M5441 Lumbago with sciatica, right side: Secondary | ICD-10-CM | POA: Diagnosis not present

## 2020-07-19 DIAGNOSIS — M6281 Muscle weakness (generalized): Secondary | ICD-10-CM | POA: Diagnosis not present

## 2020-07-19 DIAGNOSIS — G8929 Other chronic pain: Secondary | ICD-10-CM | POA: Diagnosis not present

## 2020-07-19 NOTE — Therapy (Signed)
[place] [facility] [address], [place], [address] Phone: [phone]   Fax:  [phone]  Physical Therapy Treatment And Progress Report (06/13/2020 - 07/19/2020)  Patient Details  Name: Savannah Hood MRN: [id number] Date of Birth: 01-13-1946 Referring Provider (PT): Crecencio Mc, MD   Encounter Date: 07/19/2020   PT End of Session - 07/19/20 1500    Visit Number 10    Number of Visits 17    Date for PT Re-Evaluation 08/09/20    Authorization Type 10    Authorization Time Period of 10 progress report    PT Start Time 1500    PT Stop Time 1544    PT Time Calculation (min) 44 min    Activity Tolerance Patient tolerated treatment well    Behavior During Therapy [facility] for tasks assessed/performed           Past Medical History:  Diagnosis Date  . Arthritis   . Colon polyps   . Diabetes mellitus    pre-diabetes in past  . Glaucoma   . Hyperlipidemia   . Hypertension   . Obesity     Past Surgical History:  Procedure Laterality Date  . ABDOMINAL HYSTERECTOMY     total, secondary to uterine polyps, Dr. Rayford Halsted    There were no vitals filed for this visit.   Subjective Assessment - 07/19/20 1501    Subjective Back is still a little owy but a little better than it was when she came in on Tuesday. Sleep is still pretty good. 2/10 low back (4/10 at most for the past 7 days). Has been on the floor and more active than she has been... has been doing house painting. No R LE pain at most for the past 7 days, just a tingly sensation she first woke up 2 days. Wants to to continue PT.    Pertinent History Chronic sciatica R, and LBP.  Low back pain began a year ago, gradual onset of occasional back pain. Currentlly bothers her sleeping. Denies loss of bowel or bladder control. No LE paresthesia. Tried Chiropractic treatment which did not help. Tried acupuncture which helps. The relief from the acupuncture is  not immediate which has helped with her sleep. Currently only able to sleep 3 hours straight prior to her back pain waking her up.    Patient Stated Goals Sleep better.    Currently in Pain? Yes    Pain Score 2     Pain Location Back                                     PT Education - 07/19/20 1533    Education Details ther-ex, HEP    Person(s) Educated Patient    Methods Explanation;Demonstration;Tactile cues;Verbal cues;Handout    Comprehension Returned demonstration;Verbalized understanding          Objective   Gait: trendelenberg with R lateral lean during R LE stance phase   No latex allergies Blood pressure is controlled per pt.  No longer diabetic per pt.   Pain location: R low back, posterior hip, and sciatic distribution Decreased pain with L lateral shift correction   Muscle tension B paraspinal muscles    Improved R posterior hip comfort level with R piriformis test  MedbridgeAccess Code: [email or web address]  Manual therapy L S/L STM R piriformis muscle to decrease tension   Therapeutic exercise  Prone manually resisted hip extension, S/L hip abduction 1-2x each way for each L  Hip extension 4+/5 R, 4/5 L; hip abduction 4+/5 R, 4+/5 L   Reviewed progress with PT towards goals  Supine hip extension isometrics, leg straight  R 10x3 with 5 second holds   Reviewed and given as part of her HEP. Pt demonstrated and verbalized understanding. Handout provided.   Bridge 10x3  Lower trunk rotation supine 10x2  hooklying posterior pelvic tilt 10x5 seconds, then 10x10 seconds   Seated manually resisted R lateral shift isometrics in neutral 10x3 with 5 second hold s   Improved exercise technique, movement at target joints, use of target muscles after mod verbal, visual, tactile cues.  Response to treatment Pt tolerated session well without aggravation of symptoms.    Clinical impression Pt demonstrates centralization of  symptoms, decreased overall low back pain, improved bilateral hip strength since initial evaluation. Pt making very good progress towards goals. Continued working on posture, trunk and hip strength to help decrease stress to low back. Pt tolerated session well without aggravation of symptoms. Pt will benefit from continued skilled physical therapy services to decrease pain, improve strength and function.       PT Short Term Goals - 07/19/20 1506      PT SHORT TERM GOAL #1   Title Pt will be independent with her initial HEP to decrease pain, improve ability to stand and sleep with less difficulty.    Baseline Pt has started her HEP (06/13/2020); Pt performing HEP independenty (07/19/2020)    Time 3    Period Weeks    Status Achieved    Target Date 07/05/20             PT Long Term Goals - 07/19/20 1507      PT LONG TERM GOAL #1   Title Pt will have a decrease in low back pain to 4/10 or less at most to promote ability to sleep as well as stand more comfortably.    Baseline 9/10 low back pain at most for the past 3 months (06/13/2020); 4/10 low back pain at most for the past 7 days (07/19/2020)    Time 8    Period Weeks    Status Partially Met    Target Date 08/09/20      PT LONG TERM GOAL #2   Title Pt will have a decrease in R LE pain to 3/10 or less at most to promote ability to sleep as well as stand more comfortably.    Baseline 9/10 R LE pain at most for the past 3 months (06/13/2020); 0/10 at most for the past 7 days (07/19/2020)    Time 8    Period Weeks    Status Achieved    Target Date 08/09/20      PT LONG TERM GOAL #3   Title Patient will improve bilateral hip extension and abduction strength by at least 1/2 MMT grade to promote ability to perform standing tasks more comfortably.    Baseline Hip extension 4/5 R, 4-/5 L, hip abduction 4/5 R and L (06/13/2020);Hip extension 4+/5 R, 4/5 L; hip abduction 4+/5 R, 4+/5 L (07/19/2020)    Time 8    Period Weeks    Status Achieved     Target Date 08/09/20      PT LONG TERM GOAL #4   Title Pt will improve her pelvis FOTO score by at least 10 points as a demonstration of improved function.  Baseline Pelvis FOTO 47 (06/13/2020); 69 (07/10/2020)    Time 8    Period Weeks    Status Achieved    Target Date 08/09/20                 Plan - 07/19/20 1459    Clinical Impression Statement Pt demonstrates centralization of symptoms, decreased overall low back pain, improved bilateral hip strength since initial evaluation. Pt making very good progress towards goals. Continued working on posture, trunk and hip strength to help decrease stress to low back. Pt tolerated session well without aggravation of symptoms. Pt will benefit from continued skilled physical therapy services to decrease pain, improve strength and function.    Personal Factors and Comorbidities Comorbidity 3+;Age;Fitness;Time since onset of injury/illness/exacerbation    Comorbidities Arthritis, HTN, obesity    Examination-Activity Limitations Bed Mobility;Sleep;Stand    Stability/Clinical Decision Making Stable/Uncomplicated   Pain seems to be worsening based on subjective reports   Rehab Potential Fair    PT Frequency 2x / week    PT Duration 8 weeks    PT Treatment/Interventions Therapeutic activities;Therapeutic exercise;Neuromuscular re-education;Patient/family education;Manual techniques;Dry needling;Aquatic Therapy;Electrical Stimulation;Iontophoresis 23m/ml Dexamethasone;Traction;Functional mobility training    PT Next Visit Plan posture, R hip mobility, thoracic extension, trunk and hip strength, manual techniques, modalities PRN    Consulted and Agree with Plan of Care Patient           Patient will benefit from skilled therapeutic intervention in order to improve the following deficits and impairments:  Pain,Postural dysfunction,Improper body mechanics,Decreased strength,Abnormal gait  Visit Diagnosis: Chronic right-sided low back pain  with right-sided sciatica  Muscle weakness (generalized)     Problem List Patient Active Problem List   Diagnosis Date Noted  . [place] ([place] 06/09/2020  . Hyperlipidemia associated with type 2 diabetes mellitus ([place] 06/07/2020  . Glaucoma 06/07/2020  . Chronic sciatica, right 06/07/2020  . Tubular adenoma of colon 11/19/2018  . Digital mucinous cyst of finger 11/21/2017  . Vitamin D deficiency 03/01/2016  . Routine general medical examination at a health care facility 08/28/2015  . Medicare annual wellness visit, subsequent 10/24/2013  . CKD (chronic kidney disease) stage 3, GFR 30-59 ml/min (HCC) 10/24/2013  . Candida rash of groin 08/09/2012  . Familial hyperlipidemia, high LDL 11/20/2011  . Obesity 12/23/2010  . Hypertension 12/23/2010    Thank you for your referral.  MJoneen BoersPT, DPT   07/19/2020, 5:12 PM  [facility] [address] [place] [id number]: [phone]  Fax:  [phone] Name: Savannah DeolMRN: [id number] of Birth: 507/18/1947

## 2020-07-19 NOTE — Patient Instructions (Signed)
Supine hip extension isometrics, leg straight  R 10x3 with 5 second holds   Reviewed and given as part of her HEP. Pt demonstrated and verbalized understanding. Handout provided.

## 2020-07-24 ENCOUNTER — Ambulatory Visit: Payer: Medicare HMO | Attending: Internal Medicine

## 2020-07-24 ENCOUNTER — Other Ambulatory Visit: Payer: Self-pay

## 2020-07-24 DIAGNOSIS — M5441 Lumbago with sciatica, right side: Secondary | ICD-10-CM | POA: Insufficient documentation

## 2020-07-24 DIAGNOSIS — M6281 Muscle weakness (generalized): Secondary | ICD-10-CM | POA: Insufficient documentation

## 2020-07-24 DIAGNOSIS — G8929 Other chronic pain: Secondary | ICD-10-CM | POA: Diagnosis not present

## 2020-07-24 NOTE — Therapy (Signed)
[facility] [address] [place], [address] Phone: [phone]   Fax:  [phone]  Physical Therapy Treatment  Patient Details  Name: Savannah Hood MRN: [id number] Date of Birth: Apr 03, 1945 Referring Provider (PT): Crecencio Mc, MD   Encounter Date: 07/24/2020   PT End of Session - 07/24/20 1548    Visit Number 11    Number of Visits 17    Date for PT Re-Evaluation 08/09/20    Authorization Type 1    Authorization Time Period of 10 progress report    PT Start Time 1548    PT Stop Time 1627    PT Time Calculation (min) 39 min    Activity Tolerance Patient tolerated treatment well    Behavior During Therapy [facility] for tasks assessed/performed           Past Medical History:  Diagnosis Date  . Arthritis   . Colon polyps   . Diabetes mellitus    pre-diabetes in past  . Glaucoma   . Hyperlipidemia   . Hypertension   . Obesity     Past Surgical History:  Procedure Laterality Date  . ABDOMINAL HYSTERECTOMY     total, secondary to uterine polyps, Dr. Rayford Halsted    There were no vitals filed for this visit.   Subjective Assessment - 07/24/20 1550    Subjective Back is pretty well. Friday night and Saturday night was rough. Hardly any pain now. 1/10 currently.    Pertinent History Chronic sciatica R, and LBP.  Low back pain began a year ago, gradual onset of occasional back pain. Currentlly bothers her sleeping. Denies loss of bowel or bladder control. No LE paresthesia. Tried Chiropractic treatment which did not help. Tried acupuncture which helps. The relief from the acupuncture is not immediate which has helped with her sleep. Currently only able to sleep 3 hours straight prior to her back pain waking her up.    Patient Stated Goals Sleep better.    Currently in Pain? Yes    Pain Score 1                                      PT Education - 07/24/20 1556     Education Details ther-ex    Person(s) Educated Patient    Methods Explanation;Demonstration;Tactile cues;Verbal cues    Comprehension Returned demonstration;Verbalized understanding          Objective   Gait: trendelenberg with R lateral lean during R LE stance phase   No latex allergies Blood pressure is controlled per pt.  No longer diabetic per pt.   Pain location: R low back, posterior hip, and sciatic distribution Decreased pain with L lateral shift correction   Muscle tension B paraspinal muscles    Improved R posterior hip comfort level with R piriformis test  MedbridgeAccess Code: [email or web address]    Therapeutic exercise   Seated manually resisted R lateral shift isometrics in neutral 10x3 with 5 second holds  seated manually resisted trunk extension isometrics 10x3 with 5 second hold s  Chin tuck to promote thoracic extension 10x5 seconds for 3 sets  S/L hip abduction with PT assist to decrease  Hip hike   R 10x3  L 10x3. Difficult  Lower trunk rotation supine 10x2  Bridge 10x3  Hooklying posterior pelvic tilt 10x5 seconds for 3 sets  decreased R low back pain  Then with march 10x each LE    Difficulty with L pelvic control with L hip march   Improved exercise technique, movement at target joints, use of target muscles after mod verbal, visual, tactile cues.  Response to treatment Pt states feeling better after session.   Clinical impression Minimal to no reports of back pain today. Continued working on improving posture, as well as trunk and glute strength to help decrease stress to low back when performing standing tasks. Pt tolerated session well without aggravation of symptoms. Pt will benefit from continued skilled physical therapy services to decrease pain, improve strength and function.         PT Short Term Goals - 07/19/20 1506      PT SHORT TERM GOAL #1   Title Pt will be independent with her initial HEP to  decrease pain, improve ability to stand and sleep with less difficulty.    Baseline Pt has started her HEP (06/13/2020); Pt performing HEP independenty (07/19/2020)    Time 3    Period Weeks    Status Achieved    Target Date 07/05/20             PT Long Term Goals - 07/19/20 1507      PT LONG TERM GOAL #1   Title Pt will have a decrease in low back pain to 4/10 or less at most to promote ability to sleep as well as stand more comfortably.    Baseline 9/10 low back pain at most for the past 3 months (06/13/2020); 4/10 low back pain at most for the past 7 days (07/19/2020)    Time 8    Period Weeks    Status Partially Met    Target Date 08/09/20      PT LONG TERM GOAL #2   Title Pt will have a decrease in R LE pain to 3/10 or less at most to promote ability to sleep as well as stand more comfortably.    Baseline 9/10 R LE pain at most for the past 3 months (06/13/2020); 0/10 at most for the past 7 days (07/19/2020)    Time 8    Period Weeks    Status Achieved    Target Date 08/09/20      PT LONG TERM GOAL #3   Title Patient will improve bilateral hip extension and abduction strength by at least 1/2 MMT grade to promote ability to perform standing tasks more comfortably.    Baseline Hip extension 4/5 R, 4-/5 L, hip abduction 4/5 R and L (06/13/2020);Hip extension 4+/5 R, 4/5 L; hip abduction 4+/5 R, 4+/5 L (07/19/2020)    Time 8    Period Weeks    Status Achieved    Target Date 08/09/20      PT LONG TERM GOAL #4   Title Pt will improve her pelvis FOTO score by at least 10 points as a demonstration of improved function.    Baseline Pelvis FOTO 47 (06/13/2020); 69 (07/10/2020)    Time 8    Period Weeks    Status Achieved    Target Date 08/09/20                 Plan - 07/24/20 1559    Clinical Impression Statement Minimal to no reports of back pain today. Continued working on improving posture, as well as trunk and glute strength to help decrease stress to low back when  performing standing tasks. Pt tolerated session well without aggravation of symptoms. Pt   will benefit from continued skilled physical therapy services to decrease pain, improve strength and function.    Personal Factors and Comorbidities Comorbidity 3+;Age;Fitness;Time since onset of injury/illness/exacerbation    Comorbidities Arthritis, HTN, obesity    Examination-Activity Limitations Bed Mobility;Sleep;Stand    Stability/Clinical Decision Making Stable/Uncomplicated   Pain seems to be worsening based on subjective reports   Rehab Potential Fair    PT Frequency 2x / week    PT Duration 8 weeks    PT Treatment/Interventions Therapeutic activities;Therapeutic exercise;Neuromuscular re-education;Patient/family education;Manual techniques;Dry needling;Aquatic Therapy;Electrical Stimulation;Iontophoresis 4mg/ml Dexamethasone;Traction;Functional mobility training    PT Next Visit Plan posture, R hip mobility, thoracic extension, trunk and hip strength, manual techniques, modalities PRN    Consulted and Agree with Plan of Care Patient           Patient will benefit from skilled therapeutic intervention in order to improve the following deficits and impairments:  Pain,Postural dysfunction,Improper body mechanics,Decreased strength,Abnormal gait  Visit Diagnosis: Chronic right-sided low back pain with right-sided sciatica  Muscle weakness (generalized)     Problem List Patient Active Problem List   Diagnosis Date Noted  . Abdominal aortic atherosclerosis (HCC) 06/09/2020  . Hyperlipidemia associated with type 2 diabetes mellitus (HCC) 06/07/2020  . Glaucoma 06/07/2020  . Chronic sciatica, right 06/07/2020  . Tubular adenoma of colon 11/19/2018  . Digital mucinous cyst of finger 11/21/2017  . Vitamin D deficiency 03/01/2016  . Routine general medical examination at a health care facility 08/28/2015  . Medicare annual wellness visit, subsequent 10/24/2013  . CKD (chronic kidney disease)  stage 3, GFR 30-59 ml/min (HCC) 10/24/2013  . Candida rash of groin 08/09/2012  . Familial hyperlipidemia, high LDL 11/20/2011  . Obesity 12/23/2010  . Hypertension 12/23/2010      PT, DPT   07/24/2020, 4:35 PM  [facility] [address] [place], [place], [address] Phone: [phone]   Fax:  [phone]  Name: Savannah Hood MRN: [id number] Date of Birth: 02/02/1946   

## 2020-07-31 ENCOUNTER — Ambulatory Visit: Payer: Medicare HMO

## 2020-07-31 ENCOUNTER — Other Ambulatory Visit: Payer: Self-pay

## 2020-07-31 DIAGNOSIS — M5441 Lumbago with sciatica, right side: Secondary | ICD-10-CM | POA: Diagnosis not present

## 2020-07-31 DIAGNOSIS — G8929 Other chronic pain: Secondary | ICD-10-CM | POA: Diagnosis not present

## 2020-07-31 DIAGNOSIS — M6281 Muscle weakness (generalized): Secondary | ICD-10-CM | POA: Diagnosis not present

## 2020-07-31 NOTE — Therapy (Signed)
[facility] [address] [place], [address] Phone: [phone]   Fax:  [phone]  Physical Therapy Treatment  Patient Details  Name: Savannah Hood MRN: [id number] Date of Birth: [birth year] Referring Provider (PT): Crecencio Mc, MD   Encounter Date: 07/31/2020   PT End of Session - 07/31/20 1719    Visit Number 12    Number of Visits 17    Date for PT Re-Evaluation 08/09/20    Authorization Type 2    Authorization Time Period of 10 progress report    PT Start Time 1719    PT Stop Time 1801    PT Time Calculation (min) 42 min    Activity Tolerance Patient tolerated treatment well    Behavior During Therapy [facility] for tasks assessed/performed           Past Medical History:  Diagnosis Date  . Arthritis   . Colon polyps   . Diabetes mellitus    pre-diabetes in past  . Glaucoma   . Hyperlipidemia   . Hypertension   . Obesity     Past Surgical History:  Procedure Laterality Date  . ABDOMINAL HYSTERECTOMY     total, secondary to uterine polyps, Dr. Rayford Halsted    There were no vitals filed for this visit.   Subjective Assessment - 07/31/20 1720    Subjective Had a rough couple of nights sleeping and waking up with a fair amount of pain and having a hard time getting back to sleep. Had R LE symptoms those nights.    Pertinent History Chronic sciatica R, and LBP.  Low back pain began a year ago, gradual onset of occasional back pain. Currentlly bothers her sleeping. Denies loss of bowel or bladder control. No LE paresthesia. Tried Chiropractic treatment which did not help. Tried acupuncture which helps. The relief from the acupuncture is not immediate which has helped with her sleep. Currently only able to sleep 3 hours straight prior to her back pain waking her up.    Patient Stated Goals Sleep better.    Currently in Pain? Yes    Pain Score 2                                       PT Education - 07/31/20 1737    Education Details ther-ex    Person(s) Educated Patient    Methods Explanation;Demonstration;Tactile cues;Verbal cues    Comprehension Returned demonstration;Verbalized understanding           Objective   Gait: trendelenberg with R lateral lean during R LE stance phase   No latex allergies Blood pressure is controlled per pt.  No longer diabetic per pt.   Pain location: R low back, posterior hip, and sciatic distribution Decreased pain with L lateral shift correction   Muscle tension B paraspinal muscles    Improved R posterior hip comfort level with R piriformis test  MedbridgeAccess Code: [email or web address]    Therapeutic exercise   Seated manually resisted R lateral shift isometrics in neutral 10x3 with 5 second holds  Chin tuck to promote thoracic extension 10x5 seconds for 3 sets  Seated manually resisted trunk flexion isometrics, PT manual resistance 10x3 with 5 second holds   Seated manually resisted hip extension with PT              L 10x3  Seated manually resisted  clamshell isometric, hips less than 90 degrees flexion  10x5 second holds for 3 sets  Seated hip adduction ball and glute max squeeze 10x10 seconds for 2 sets            Improved exercise technique, movement at target joints, use of target muscles after mod verbal, visual, tactile cues.    Manual therapy Seated STM R lumbar paraspinal and quadratus lumborum muscles to decrease tension     Response to treatment Pt states back feeling good after session.   Clinical impression Continued working on improving posture, trunk and glute strength and decreasing R lumbar paraspinal and quadratus lumborum muscle tension to help decrease stress to low back. Held of on lower trunk rotation, resisted trunk extension, as well as S/L hip abduction exercises today to see if it helps improve comfort with  sleep. Good muscle use reported by pt with exercises. Pt tolerated session well without aggravation of symptoms. Pt will benefit from continued skilled physical therapy services to decrease pain, improve strength and function.        PT Short Term Goals - 07/19/20 1506      PT SHORT TERM GOAL #1   Title Pt will be independent with her initial HEP to decrease pain, improve ability to stand and sleep with less difficulty.    Baseline Pt has started her HEP (06/13/2020); Pt performing HEP independenty (07/19/2020)    Time 3    Period Weeks    Status Achieved    Target Date 07/05/20             PT Long Term Goals - 07/19/20 1507      PT LONG TERM GOAL #1   Title Pt will have a decrease in low back pain to 4/10 or less at most to promote ability to sleep as well as stand more comfortably.    Baseline 9/10 low back pain at most for the past 3 months (06/13/2020); 4/10 low back pain at most for the past 7 days (07/19/2020)    Time 8    Period Weeks    Status Partially Met    Target Date 08/09/20      PT LONG TERM GOAL #2   Title Pt will have a decrease in R LE pain to 3/10 or less at most to promote ability to sleep as well as stand more comfortably.    Baseline 9/10 R LE pain at most for the past 3 months (06/13/2020); 0/10 at most for the past 7 days (07/19/2020)    Time 8    Period Weeks    Status Achieved    Target Date 08/09/20      PT LONG TERM GOAL #3   Title Patient will improve bilateral hip extension and abduction strength by at least 1/2 MMT grade to promote ability to perform standing tasks more comfortably.    Baseline Hip extension 4/5 R, 4-/5 L, hip abduction 4/5 R and L (06/13/2020);Hip extension 4+/5 R, 4/5 L; hip abduction 4+/5 R, 4+/5 L (07/19/2020)    Time 8    Period Weeks    Status Achieved    Target Date 08/09/20      PT LONG TERM GOAL #4   Title Pt will improve her pelvis FOTO score by at least 10 points as a demonstration of improved function.    Baseline  Pelvis FOTO 47 (06/13/2020); 69 (07/10/2020)    Time 8    Period Weeks    Status Achieved  Target Date 08/09/20                 Plan - 07/31/20 1804    Clinical Impression Statement Continued working on improving posture, trunk and glute strength and decreasing R lumbar paraspinal and quadratus lumborum muscle tension to help decrease stress to low back. Held of on lower trunk rotation, resisted trunk extension, as well as S/L hip abduction exercises today to see if it helps improve comfort with sleep. Good muscle use reported by pt with exercises. Pt tolerated session well without aggravation of symptoms. Pt will benefit from continued skilled physical therapy services to decrease pain, improve strength and function.    Personal Factors and Comorbidities Comorbidity 3+;Age;Fitness;Time since onset of injury/illness/exacerbation    Comorbidities Arthritis, HTN, obesity    Examination-Activity Limitations Bed Mobility;Sleep;Stand    Stability/Clinical Decision Making Stable/Uncomplicated   Pain seems to be worsening based on subjective reports   Rehab Potential Fair    PT Frequency 2x / week    PT Duration 8 weeks    PT Treatment/Interventions Therapeutic activities;Therapeutic exercise;Neuromuscular re-education;Patient/family education;Manual techniques;Dry needling;Aquatic Therapy;Electrical Stimulation;Iontophoresis 15m/ml Dexamethasone;Traction;Functional mobility training    PT Next Visit Plan posture, R hip mobility, thoracic extension, trunk and hip strength, manual techniques, modalities PRN    Consulted and Agree with Plan of Care Patient           Patient will benefit from skilled therapeutic intervention in order to improve the following deficits and impairments:  Pain,Postural dysfunction,Improper body mechanics,Decreased strength,Abnormal gait  Visit Diagnosis: Chronic right-sided low back pain with right-sided sciatica  Muscle weakness (generalized)     Problem  List Patient Active Problem List   Diagnosis Date Noted  . Abdominal aortic atherosclerosis ([place] 06/09/2020  . Hyperlipidemia associated with type 2 diabetes mellitus ([facility] 06/07/2020  . Glaucoma 06/07/2020  . Chronic sciatica, right 06/07/2020  . Tubular adenoma of colon 11/19/2018  . Digital mucinous cyst of finger 11/21/2017  . Vitamin D deficiency 03/01/2016  . Routine general medical examination at a health care facility 08/28/2015  . Medicare annual wellness visit, subsequent 10/24/2013  . CKD (chronic kidney disease) stage 3, GFR 30-59 ml/min (HCC) 10/24/2013  . Candida rash of groin 08/09/2012  . Familial hyperlipidemia, high LDL 11/20/2011  . Obesity 12/23/2010  . Hypertension 12/23/2010    MJoneen BoersPT, DPT  07/31/2020, 6:05 PM  [facility] [address] [place] [id number]: [phone]  Fax:  [phone] Name: Savannah TaittMRN: [id number] of Birth: 506/06/1945

## 2020-08-01 DIAGNOSIS — H401131 Primary open-angle glaucoma, bilateral, mild stage: Secondary | ICD-10-CM | POA: Diagnosis not present

## 2020-08-13 ENCOUNTER — Other Ambulatory Visit: Payer: Self-pay

## 2020-08-13 ENCOUNTER — Ambulatory Visit: Payer: Medicare HMO

## 2020-08-13 DIAGNOSIS — M6281 Muscle weakness (generalized): Secondary | ICD-10-CM

## 2020-08-13 DIAGNOSIS — M5441 Lumbago with sciatica, right side: Secondary | ICD-10-CM | POA: Diagnosis not present

## 2020-08-13 DIAGNOSIS — G8929 Other chronic pain: Secondary | ICD-10-CM | POA: Diagnosis not present

## 2020-08-13 NOTE — Therapy (Signed)
[facility] [address] [place], [address] Phone: [phone]   Fax:  [phone]  Physical Therapy Treatment  Patient Details  Name: Savannah Hood MRN: [id number] Date of Birth: Jan 24, 1946 Referring Provider (PT): Crecencio Mc, MD   Encounter Date: 08/13/2020   PT End of Session - 08/13/20 1722    Visit Number 13    Number of Visits 17    Date for PT Re-Evaluation 08/30/20    Authorization Type 3    Authorization Time Period of 10 progress report    PT Start Time 1722    PT Stop Time 1802    PT Time Calculation (min) 40 min    Activity Tolerance Patient tolerated treatment well    Behavior During Therapy [facility] for tasks assessed/performed           Past Medical History:  Diagnosis Date  . Arthritis   . Colon polyps   . Diabetes mellitus    pre-diabetes in past  . Glaucoma   . Hyperlipidemia   . Hypertension   . Obesity     Past Surgical History:  Procedure Laterality Date  . ABDOMINAL HYSTERECTOMY     total, secondary to uterine polyps, Dr. Rayford Halsted    There were no vitals filed for this visit.   Subjective Assessment - 08/13/20 1723    Subjective Back did really well with her trip. No pain currently. Sleep has been much better. No Radiating symptoms.    Pertinent History Chronic sciatica R, and LBP.  Low back pain began a year ago, gradual onset of occasional back pain. Currentlly bothers her sleeping. Denies loss of bowel or bladder control. No LE paresthesia. Tried Chiropractic treatment which did not help. Tried acupuncture which helps. The relief from the acupuncture is not immediate which has helped with her sleep. Currently only able to sleep 3 hours straight prior to her back pain waking her up.    Patient Stated Goals Sleep better.    Currently in Pain? No/denies    Pain Score 0-No pain                                     PT Education -  08/13/20 1739    Education Details ther-ex, POC    Person(s) Educated Patient    Methods Explanation;Demonstration;Tactile cues;Verbal cues    Comprehension Returned demonstration;Verbalized understanding           Objective   Gait: trendelenberg with R lateral lean during R LE stance phase   No latex allergies Blood pressure is controlled per pt.  No longer diabetic per pt.   Pain location: R low back, posterior hip, and sciatic distribution Decreased pain with L lateral shift correction   Muscle tension B paraspinal muscles    Improved R posterior hip comfort level with R piriformis test  MedbridgeAccess Code: [email or web address]    Therapeutic exercise  Reviewed progress with PT towards goals  Reviewed plan of care: pt to perform HEP for the next 2 weeks without being in clinic then return for a follow up (2 weeks from now)  Seated manually resisted R lateral shift isometrics in neutral 10x3 with 5 second holds   Seated manually resisted trunk flexion isometrics, PT manual resistance 10x3 with 5 second holds    Seated manually resisted hip extension with PT  L 10x3  Seated manually resisted  clamshell isometric, hips less than 90 degrees flexion             10x5 second holds for 3 sets  Seated hip adduction ball and glute max squeeze 10x10 seconds for 2 sets  Chin tuck to promote thoracic extension 10x5 seconds for 3 sets    Improved exercise technique, movement at target joints, use of target muscles after mod verbal, visual, tactile cues.    Manual therapy Seated STM R lumbar paraspinal muscles to decrease tension     Response to treatment Pt tolerated session well without aggravation of symptoms.   Clinical impression Pt making very good progress with decreased low back and R LE pain based on subjective reports. Pt also demonstrates improved B hip strength and function as well since initial evaluation. Pt  to try performing her HEP for 2 weeks instead of going to the clinic. Pt to return for a follow up in 2 weeks to assess ability to maintain progress.      PT Short Term Goals - 07/19/20 1506      PT SHORT TERM GOAL #1   Title Pt will be independent with her initial HEP to decrease pain, improve ability to stand and sleep with less difficulty.    Baseline Pt has started her HEP (06/13/2020); Pt performing HEP independenty (07/19/2020)    Time 3    Period Weeks    Status Achieved    Target Date 07/05/20             PT Long Term Goals - 08/13/20 1724      PT LONG TERM GOAL #1   Title Pt will have a decrease in low back pain to 4/10 or less at most to promote ability to sleep as well as stand more comfortably.    Baseline 9/10 low back pain at most for the past 3 months (06/13/2020); 4/10 low back pain at most for the past 7 days (07/19/2020); 2-3/10 at most for the past 7 days (08/13/2020)    Time 8    Period Weeks    Status Achieved    Target Date 08/09/20      PT LONG TERM GOAL #2   Title Pt will have a decrease in R LE pain to 3/10 or less at most to promote ability to sleep as well as stand more comfortably.    Baseline 9/10 R LE pain at most for the past 3 months (06/13/2020); 0/10 at most for the past 7 days (07/19/2020), (08/13/2020)    Time 8    Period Weeks    Status Achieved    Target Date 08/09/20      PT LONG TERM GOAL #3   Title Patient will improve bilateral hip extension and abduction strength by at least 1/2 MMT grade to promote ability to perform standing tasks more comfortably.    Baseline Hip extension 4/5 R, 4-/5 L, hip abduction 4/5 R and L (06/13/2020);Hip extension 4+/5 R, 4/5 L; hip abduction 4+/5 R, 4+/5 L (07/19/2020)    Time 8    Period Weeks    Status Achieved    Target Date 08/09/20      PT LONG TERM GOAL #4   Title Pt will improve her pelvis FOTO score by at least 10 points as a demonstration of improved function.    Baseline Pelvis FOTO 47 (06/13/2020);  69 (07/10/2020)    Time 8    Period Weeks    Status Achieved    Target Date  08/09/20      PT LONG TERM GOAL #5   Title Pt will be able to maintain progress with PT with her HEP.    Time 2    Period Weeks    Status New    Target Date 08/30/20                 Plan - 08/13/20 1743    Clinical Impression Statement Pt making very good progress with decreased low back and R LE pain based on subjective reports. Pt also demonstrates improved B hip strength and function as well since initial evaluation. Pt to try performing her HEP for 2 weeks instead of going to the clinic. Pt to return for a follow up in 2 weeks to assess ability to maintain progress.    Personal Factors and Comorbidities Comorbidity 3+;Age;Fitness;Time since onset of injury/illness/exacerbation    Comorbidities Arthritis, HTN, obesity    Examination-Activity Limitations Bed Mobility;Sleep;Stand    Stability/Clinical Decision Making Stable/Uncomplicated   Pain seems to be worsening based on subjective reports   Clinical Decision Making Low    Rehab Potential Fair    PT Frequency One time visit    PT Duration 2 weeks    PT Treatment/Interventions Therapeutic activities;Therapeutic exercise;Neuromuscular re-education;Patient/family education;Manual techniques;Dry needling;Aquatic Therapy;Electrical Stimulation;Iontophoresis 4mg /ml Dexamethasone;Traction;Functional mobility training    PT Next Visit Plan posture, R hip mobility, thoracic extension, trunk and hip strength, manual techniques, modalities PRN    PT Home Exercise Plan Medbridge Access Code: [email or web address]    Consulted and Agree with Plan of Care Patient           Patient will benefit from skilled therapeutic intervention in order to improve the following deficits and impairments:  Pain,Postural dysfunction,Improper body mechanics,Decreased strength,Abnormal gait  Visit Diagnosis: Chronic right-sided low back pain with right-sided sciatica  Muscle weakness  (generalized)     Problem List Patient Active Problem List   Diagnosis Date Noted  . Abdominal aortic atherosclerosis ([place]) 06/09/2020  . Hyperlipidemia associated with type 2 diabetes mellitus ([place]) 06/07/2020  . Glaucoma 06/07/2020  . Chronic sciatica, right 06/07/2020  . Tubular adenoma of colon 11/19/2018  . Digital mucinous cyst of finger 11/21/2017  . Vitamin D deficiency 03/01/2016  . Routine general medical examination at a health care facility 08/28/2015  . Medicare annual wellness visit, subsequent 10/24/2013  . CKD (chronic kidney disease) stage 3, GFR 30-59 ml/min (HCC) 10/24/2013  . Candida rash of groin 08/09/2012  . Familial hyperlipidemia, high LDL 11/20/2011  . Obesity 12/23/2010  . Hypertension 12/23/2010     Joneen Boers PT, DPT   08/13/2020, 6:08 PM  [facility] [address] [place], [address] Phone: [phone]   Fax:  [phone]  Name: Savannah Hood MRN: [id number] Date of Birth: 07-21-1945

## 2020-08-15 ENCOUNTER — Ambulatory Visit: Payer: Medicare HMO

## 2020-08-21 ENCOUNTER — Ambulatory Visit: Payer: Medicare HMO

## 2020-08-23 ENCOUNTER — Ambulatory Visit: Payer: Medicare HMO

## 2020-08-30 ENCOUNTER — Other Ambulatory Visit: Payer: Self-pay

## 2020-08-30 ENCOUNTER — Ambulatory Visit: Payer: Medicare HMO | Attending: Internal Medicine

## 2020-08-30 DIAGNOSIS — M6281 Muscle weakness (generalized): Secondary | ICD-10-CM | POA: Insufficient documentation

## 2020-08-30 DIAGNOSIS — M5441 Lumbago with sciatica, right side: Secondary | ICD-10-CM | POA: Diagnosis not present

## 2020-08-30 DIAGNOSIS — G8929 Other chronic pain: Secondary | ICD-10-CM | POA: Diagnosis not present

## 2020-08-30 NOTE — Patient Instructions (Signed)
Access Code: [email or web address] URL: [email or web address] Date: 08/30/2020 Prepared by: Joneen Boers  Exercises Seated Hip Adduction Isometrics with Diona Foley - 1 x daily - 7 x weekly - 3 sets - 10 reps - 5 seconds hold Seated Piriformis Stretch - 3 x daily - 7 x weekly - 1 sets - 3 reps - 30 seconds hold Seated Thoracic Lumbar Extension - 1 x daily - 7 x weekly - 3 sets - 10 reps - 5 seconds hold Seated Flexion Stretch - 1 x daily - 7 x weekly - 3 sets - 10 reps - 5 seconds hold Supine Posterior Pelvic Tilt - 1 x daily - 7 x weekly - 3 sets - 10 reps - 5 seconds hold Hooklying Clamshell with Resistance - 1 x daily - 7 x weekly - 3 sets - 10 reps Curl Up with Arms Crossed - 1 x daily - 7 x weekly - 3 sets - 10 reps

## 2020-08-30 NOTE — Therapy (Signed)
[facility] [address], [place], [address] Phone: [phone]   Fax:  [phone]  Physical Therapy Treatment And Discharge Summary  Patient Details  Name: Savannah Hood MRN: [id number] Date of Birth: Oct 17, 1945 Referring Provider (PT): Crecencio Mc, MD   Encounter Date: 08/30/2020   PT End of Session - 08/30/20 1551     Visit Number 14    Number of Visits 17    Date for PT Re-Evaluation 08/30/20    Authorization Type 4    Authorization Time Period of 10 progress report    PT Start Time 1551    PT Stop Time 6962    PT Time Calculation (min) 23 min    Activity Tolerance Patient tolerated treatment well    Behavior During Therapy [facility] for tasks assessed/performed             Past Medical History:  Diagnosis Date   Arthritis    Colon polyps    Diabetes mellitus    pre-diabetes in past   Glaucoma    Hyperlipidemia    Hypertension    Obesity     Past Surgical History:  Procedure Laterality Date   ABDOMINAL HYSTERECTOMY     total, secondary to uterine polyps, Dr. Rayford Halsted    There were no vitals filed for this visit.   Subjective Assessment - 08/30/20 1552     Subjective Back is doing pretty well. Had a couple of rough nights and the HEP helps. 1/10 currently, noticeable but not a bother. Feels like she can graduate PT today and continue with her HEP.    Pertinent History Chronic sciatica R, and LBP.  Low back pain began a year ago, gradual onset of occasional back pain. Currentlly bothers her sleeping. Denies loss of bowel or bladder control. No LE paresthesia. Tried Chiropractic treatment which did not help. Tried acupuncture which helps. The relief from the acupuncture is not immediate which has helped with her sleep. Currently only able to sleep 3 hours straight prior to her back pain waking her up.    Patient Stated Goals Sleep better.    Currently in Pain? Yes    Pain Score 1                                         PT Education - 08/30/20 1604     Education Details ther-ex, HEP    Person(s) Educated Patient    Methods Explanation;Demonstration;Tactile cues;Verbal cues;Handout    Comprehension Verbalized understanding;Returned demonstration             Objective    Gait: trendelenberg with R lateral lean during R LE stance phase   No latex allergies Blood pressure is controlled per pt. No longer diabetic per pt.   Pain location: R low back, posterior hip, and sciatic distribution Decreased pain with L lateral shift correction   Muscle tension B paraspinal muscles     Improved R posterior hip comfort level with R piriformis test    Medbridge Access Code: [email or web address]       Therapeutic exercise     Seated manually resisted trunk flexion isometrics, PT manual resistance 10x3 with 5 second holds    Crunches 10x3  Seated clamshell isometrics with strap, hips less than 90 degrees flexion             10x5  second holds for 3 sets   Reviewed progress/maintenance of progress with PT towards goals  Reviewed added exercise for home and upgraded supine resisted clamshell to charcoal band, then to silver when exercise becomes easy. Pt demonstrated and verbalized understanding.       Response to treatment  No pain after session     Clinical impression Pt returns to PT with maintaining progress with goals with improvement in function. Pt has achieved all goals and demonstrates independence and consistency with her HEP. Skilled physical therapy services discharged with pt continuing progress with her exercises at home.          PT Short Term Goals - 07/19/20 1506       PT SHORT TERM GOAL #1   Title Pt will be independent with her initial HEP to decrease pain, improve ability to stand and sleep with less difficulty.    Baseline Pt has started her HEP (06/13/2020); Pt performing HEP independenty (07/19/2020)    Time 3     Period Weeks    Status Achieved    Target Date 07/05/20               PT Long Term Goals - 08/30/20 1554       PT LONG TERM GOAL #1   Title Pt will have a decrease in low back pain to 4/10 or less at most to promote ability to sleep as well as stand more comfortably.    Baseline 9/10 low back pain at most for the past 3 months (06/13/2020); 4/10 low back pain at most for the past 7 days (07/19/2020); 2-3/10 at most for the past 7 days (08/13/2020); 3/10 at most for the past 7 days, but only at night for a couple of nights only (08/30/2020)    Time 8    Period Weeks    Status Achieved    Target Date 08/30/20      PT LONG TERM GOAL #2   Title Pt will have a decrease in R LE pain to 3/10 or less at most to promote ability to sleep as well as stand more comfortably.    Baseline 9/10 R LE pain at most for the past 3 months (06/13/2020); 0/10 at most for the past 7 days (07/19/2020), (08/13/2020), no R LE pain (08/30/2020)    Time 8    Period Weeks    Status Achieved    Target Date 08/30/20      PT LONG TERM GOAL #3   Title Patient will improve bilateral hip extension and abduction strength by at least 1/2 MMT grade to promote ability to perform standing tasks more comfortably.    Baseline Hip extension 4/5 R, 4-/5 L, hip abduction 4/5 R and L (06/13/2020);Hip extension 4+/5 R, 4/5 L; hip abduction 4+/5 R, 4+/5 L (07/19/2020)    Time 8    Period Weeks    Status Achieved      PT LONG TERM GOAL #4   Title Pt will improve her pelvis FOTO score by at least 10 points as a demonstration of improved function.    Baseline Pelvis FOTO 47 (06/13/2020); 69 (07/10/2020); 75 (08/30/2020)    Time 8    Period Weeks    Status Achieved    Target Date 08/30/20      PT LONG TERM GOAL #5   Title Pt will be able to maintain progress with PT with her HEP.    Time 2    Period Weeks  Status Achieved    Target Date 08/30/20                   Plan - 08/30/20 1611     Clinical Impression  Statement Pt returns to PT with maintaining progress with goals with improvement in function. Pt has achieved all goals and demonstrates independence and consistency with her HEP. Skilled physical therapy services discharged with pt continuing progress with her exercises at home.    Personal Factors and Comorbidities Comorbidity 3+;Age;Fitness;Time since onset of injury/illness/exacerbation    Comorbidities Arthritis, HTN, obesity    Examination-Activity Limitations --    Stability/Clinical Decision Making --    Rehab Potential --    PT Frequency --    PT Duration --    PT Treatment/Interventions Therapeutic activities;Therapeutic exercise;Neuromuscular re-education;Patient/family education;Manual techniques    PT Next Visit Plan Continue progress with her exercises at home    PT [place] Access Code: [email or web address]    Consulted and Agree with Plan of Care Patient             Patient will benefit from skilled therapeutic intervention in order to improve the following deficits and impairments:     Visit Diagnosis: Chronic right-sided low back pain with right-sided sciatica  Muscle weakness (generalized)     Problem List Patient Active Problem List   Diagnosis Date Noted   Abdominal aortic atherosclerosis ([place]) 06/09/2020   Hyperlipidemia associated with type 2 diabetes mellitus ([place]) 06/07/2020   Glaucoma 06/07/2020   Chronic sciatica, right 06/07/2020   Tubular adenoma of colon 11/19/2018   Digital mucinous cyst of finger 11/21/2017   Vitamin D deficiency 03/01/2016   Routine general medical examination at a health care facility 08/28/2015   Medicare annual wellness visit, subsequent 10/24/2013   CKD (chronic kidney disease) stage 3, GFR 30-59 ml/min (HCC) 10/24/2013   Candida rash of groin 08/09/2012   Familial hyperlipidemia, high LDL 11/20/2011   Obesity 12/23/2010   Hypertension 12/23/2010     Thank you for your referral.   Joneen Boers PT,  DPT   08/30/2020, 6:27 PM  [facility] [address] [place], [address] Phone: [phone]   Fax:  [phone]  Name: Savannah Hood MRN: [id number] Date of Birth: February 07, 1946

## 2020-10-02 DIAGNOSIS — M9901 Segmental and somatic dysfunction of cervical region: Secondary | ICD-10-CM | POA: Diagnosis not present

## 2020-10-02 DIAGNOSIS — M9903 Segmental and somatic dysfunction of lumbar region: Secondary | ICD-10-CM | POA: Diagnosis not present

## 2020-10-02 DIAGNOSIS — M5459 Other low back pain: Secondary | ICD-10-CM | POA: Diagnosis not present

## 2020-10-02 DIAGNOSIS — M9902 Segmental and somatic dysfunction of thoracic region: Secondary | ICD-10-CM | POA: Diagnosis not present

## 2020-10-04 DIAGNOSIS — M9902 Segmental and somatic dysfunction of thoracic region: Secondary | ICD-10-CM | POA: Diagnosis not present

## 2020-10-04 DIAGNOSIS — M9901 Segmental and somatic dysfunction of cervical region: Secondary | ICD-10-CM | POA: Diagnosis not present

## 2020-10-04 DIAGNOSIS — M9903 Segmental and somatic dysfunction of lumbar region: Secondary | ICD-10-CM | POA: Diagnosis not present

## 2020-10-04 DIAGNOSIS — M5459 Other low back pain: Secondary | ICD-10-CM | POA: Diagnosis not present

## 2020-10-08 DIAGNOSIS — H6063 Unspecified chronic otitis externa, bilateral: Secondary | ICD-10-CM | POA: Diagnosis not present

## 2020-10-08 DIAGNOSIS — H6123 Impacted cerumen, bilateral: Secondary | ICD-10-CM | POA: Diagnosis not present

## 2020-10-08 DIAGNOSIS — M9903 Segmental and somatic dysfunction of lumbar region: Secondary | ICD-10-CM | POA: Diagnosis not present

## 2020-10-08 DIAGNOSIS — M9902 Segmental and somatic dysfunction of thoracic region: Secondary | ICD-10-CM | POA: Diagnosis not present

## 2020-10-08 DIAGNOSIS — M5459 Other low back pain: Secondary | ICD-10-CM | POA: Diagnosis not present

## 2020-10-08 DIAGNOSIS — H903 Sensorineural hearing loss, bilateral: Secondary | ICD-10-CM | POA: Diagnosis not present

## 2020-10-08 DIAGNOSIS — M9901 Segmental and somatic dysfunction of cervical region: Secondary | ICD-10-CM | POA: Diagnosis not present

## 2020-10-09 DIAGNOSIS — M9901 Segmental and somatic dysfunction of cervical region: Secondary | ICD-10-CM | POA: Diagnosis not present

## 2020-10-09 DIAGNOSIS — M9902 Segmental and somatic dysfunction of thoracic region: Secondary | ICD-10-CM | POA: Diagnosis not present

## 2020-10-09 DIAGNOSIS — M5459 Other low back pain: Secondary | ICD-10-CM | POA: Diagnosis not present

## 2020-10-09 DIAGNOSIS — M9903 Segmental and somatic dysfunction of lumbar region: Secondary | ICD-10-CM | POA: Diagnosis not present

## 2020-10-10 DIAGNOSIS — M5459 Other low back pain: Secondary | ICD-10-CM | POA: Diagnosis not present

## 2020-10-10 DIAGNOSIS — M9903 Segmental and somatic dysfunction of lumbar region: Secondary | ICD-10-CM | POA: Diagnosis not present

## 2020-10-10 DIAGNOSIS — M9901 Segmental and somatic dysfunction of cervical region: Secondary | ICD-10-CM | POA: Diagnosis not present

## 2020-10-10 DIAGNOSIS — M9902 Segmental and somatic dysfunction of thoracic region: Secondary | ICD-10-CM | POA: Diagnosis not present

## 2020-10-11 DIAGNOSIS — M5459 Other low back pain: Secondary | ICD-10-CM | POA: Diagnosis not present

## 2020-10-11 DIAGNOSIS — M9903 Segmental and somatic dysfunction of lumbar region: Secondary | ICD-10-CM | POA: Diagnosis not present

## 2020-10-11 DIAGNOSIS — M9902 Segmental and somatic dysfunction of thoracic region: Secondary | ICD-10-CM | POA: Diagnosis not present

## 2020-10-11 DIAGNOSIS — M9901 Segmental and somatic dysfunction of cervical region: Secondary | ICD-10-CM | POA: Diagnosis not present

## 2020-10-15 DIAGNOSIS — M9903 Segmental and somatic dysfunction of lumbar region: Secondary | ICD-10-CM | POA: Diagnosis not present

## 2020-10-15 DIAGNOSIS — M9901 Segmental and somatic dysfunction of cervical region: Secondary | ICD-10-CM | POA: Diagnosis not present

## 2020-10-15 DIAGNOSIS — M5459 Other low back pain: Secondary | ICD-10-CM | POA: Diagnosis not present

## 2020-10-15 DIAGNOSIS — M9902 Segmental and somatic dysfunction of thoracic region: Secondary | ICD-10-CM | POA: Diagnosis not present

## 2020-10-17 DIAGNOSIS — M9903 Segmental and somatic dysfunction of lumbar region: Secondary | ICD-10-CM | POA: Diagnosis not present

## 2020-10-17 DIAGNOSIS — M9902 Segmental and somatic dysfunction of thoracic region: Secondary | ICD-10-CM | POA: Diagnosis not present

## 2020-10-17 DIAGNOSIS — M5459 Other low back pain: Secondary | ICD-10-CM | POA: Diagnosis not present

## 2020-10-17 DIAGNOSIS — M9901 Segmental and somatic dysfunction of cervical region: Secondary | ICD-10-CM | POA: Diagnosis not present

## 2020-10-18 DIAGNOSIS — M9901 Segmental and somatic dysfunction of cervical region: Secondary | ICD-10-CM | POA: Diagnosis not present

## 2020-10-18 DIAGNOSIS — M9903 Segmental and somatic dysfunction of lumbar region: Secondary | ICD-10-CM | POA: Diagnosis not present

## 2020-10-18 DIAGNOSIS — M5459 Other low back pain: Secondary | ICD-10-CM | POA: Diagnosis not present

## 2020-10-18 DIAGNOSIS — M9902 Segmental and somatic dysfunction of thoracic region: Secondary | ICD-10-CM | POA: Diagnosis not present

## 2020-10-22 DIAGNOSIS — M5459 Other low back pain: Secondary | ICD-10-CM | POA: Diagnosis not present

## 2020-10-22 DIAGNOSIS — M9901 Segmental and somatic dysfunction of cervical region: Secondary | ICD-10-CM | POA: Diagnosis not present

## 2020-10-22 DIAGNOSIS — M9903 Segmental and somatic dysfunction of lumbar region: Secondary | ICD-10-CM | POA: Diagnosis not present

## 2020-10-22 DIAGNOSIS — M9902 Segmental and somatic dysfunction of thoracic region: Secondary | ICD-10-CM | POA: Diagnosis not present

## 2020-10-24 DIAGNOSIS — M9901 Segmental and somatic dysfunction of cervical region: Secondary | ICD-10-CM | POA: Diagnosis not present

## 2020-10-24 DIAGNOSIS — M5459 Other low back pain: Secondary | ICD-10-CM | POA: Diagnosis not present

## 2020-10-24 DIAGNOSIS — M9902 Segmental and somatic dysfunction of thoracic region: Secondary | ICD-10-CM | POA: Diagnosis not present

## 2020-10-24 DIAGNOSIS — M9903 Segmental and somatic dysfunction of lumbar region: Secondary | ICD-10-CM | POA: Diagnosis not present

## 2020-10-25 DIAGNOSIS — M9901 Segmental and somatic dysfunction of cervical region: Secondary | ICD-10-CM | POA: Diagnosis not present

## 2020-10-25 DIAGNOSIS — M5459 Other low back pain: Secondary | ICD-10-CM | POA: Diagnosis not present

## 2020-10-25 DIAGNOSIS — M9903 Segmental and somatic dysfunction of lumbar region: Secondary | ICD-10-CM | POA: Diagnosis not present

## 2020-10-25 DIAGNOSIS — M9902 Segmental and somatic dysfunction of thoracic region: Secondary | ICD-10-CM | POA: Diagnosis not present

## 2020-10-29 DIAGNOSIS — M9902 Segmental and somatic dysfunction of thoracic region: Secondary | ICD-10-CM | POA: Diagnosis not present

## 2020-10-29 DIAGNOSIS — M9903 Segmental and somatic dysfunction of lumbar region: Secondary | ICD-10-CM | POA: Diagnosis not present

## 2020-10-29 DIAGNOSIS — M9901 Segmental and somatic dysfunction of cervical region: Secondary | ICD-10-CM | POA: Diagnosis not present

## 2020-10-29 DIAGNOSIS — M5459 Other low back pain: Secondary | ICD-10-CM | POA: Diagnosis not present

## 2020-10-31 DIAGNOSIS — M9901 Segmental and somatic dysfunction of cervical region: Secondary | ICD-10-CM | POA: Diagnosis not present

## 2020-10-31 DIAGNOSIS — M5459 Other low back pain: Secondary | ICD-10-CM | POA: Diagnosis not present

## 2020-10-31 DIAGNOSIS — M9902 Segmental and somatic dysfunction of thoracic region: Secondary | ICD-10-CM | POA: Diagnosis not present

## 2020-10-31 DIAGNOSIS — M9903 Segmental and somatic dysfunction of lumbar region: Secondary | ICD-10-CM | POA: Diagnosis not present

## 2020-11-02 MED ORDER — TRAMADOL HCL 50 MG PO TABS: 50.0000 mg | ORAL_TABLET | Freq: Four times a day (QID) | ORAL | 0 refills | Status: AC | PRN

## 2020-11-02 NOTE — Addendum Note (Signed)
Addended by: Crecencio Mc on: 11/02/2020 03:12 PM   Modules accepted: Orders

## 2020-11-06 DIAGNOSIS — M9901 Segmental and somatic dysfunction of cervical region: Secondary | ICD-10-CM | POA: Diagnosis not present

## 2020-11-06 DIAGNOSIS — M9902 Segmental and somatic dysfunction of thoracic region: Secondary | ICD-10-CM | POA: Diagnosis not present

## 2020-11-06 DIAGNOSIS — M5459 Other low back pain: Secondary | ICD-10-CM | POA: Diagnosis not present

## 2020-11-06 DIAGNOSIS — M9903 Segmental and somatic dysfunction of lumbar region: Secondary | ICD-10-CM | POA: Diagnosis not present

## 2020-11-07 DIAGNOSIS — M5459 Other low back pain: Secondary | ICD-10-CM | POA: Diagnosis not present

## 2020-11-07 DIAGNOSIS — M9902 Segmental and somatic dysfunction of thoracic region: Secondary | ICD-10-CM | POA: Diagnosis not present

## 2020-11-07 DIAGNOSIS — M9901 Segmental and somatic dysfunction of cervical region: Secondary | ICD-10-CM | POA: Diagnosis not present

## 2020-11-07 DIAGNOSIS — M9903 Segmental and somatic dysfunction of lumbar region: Secondary | ICD-10-CM | POA: Diagnosis not present

## 2020-11-08 DIAGNOSIS — M9902 Segmental and somatic dysfunction of thoracic region: Secondary | ICD-10-CM | POA: Diagnosis not present

## 2020-11-08 DIAGNOSIS — M5459 Other low back pain: Secondary | ICD-10-CM | POA: Diagnosis not present

## 2020-11-08 DIAGNOSIS — M9901 Segmental and somatic dysfunction of cervical region: Secondary | ICD-10-CM | POA: Diagnosis not present

## 2020-11-08 DIAGNOSIS — M9903 Segmental and somatic dysfunction of lumbar region: Secondary | ICD-10-CM | POA: Diagnosis not present

## 2020-11-10 DIAGNOSIS — Z23 Encounter for immunization: Secondary | ICD-10-CM | POA: Diagnosis not present

## 2020-11-12 DIAGNOSIS — M9903 Segmental and somatic dysfunction of lumbar region: Secondary | ICD-10-CM | POA: Diagnosis not present

## 2020-11-12 DIAGNOSIS — M9902 Segmental and somatic dysfunction of thoracic region: Secondary | ICD-10-CM | POA: Diagnosis not present

## 2020-11-12 DIAGNOSIS — M5459 Other low back pain: Secondary | ICD-10-CM | POA: Diagnosis not present

## 2020-11-12 DIAGNOSIS — M9901 Segmental and somatic dysfunction of cervical region: Secondary | ICD-10-CM | POA: Diagnosis not present

## 2020-11-14 DIAGNOSIS — M9902 Segmental and somatic dysfunction of thoracic region: Secondary | ICD-10-CM | POA: Diagnosis not present

## 2020-11-14 DIAGNOSIS — M5459 Other low back pain: Secondary | ICD-10-CM | POA: Diagnosis not present

## 2020-11-14 DIAGNOSIS — M9903 Segmental and somatic dysfunction of lumbar region: Secondary | ICD-10-CM | POA: Diagnosis not present

## 2020-11-14 DIAGNOSIS — M9901 Segmental and somatic dysfunction of cervical region: Secondary | ICD-10-CM | POA: Diagnosis not present

## 2020-11-28 ENCOUNTER — Other Ambulatory Visit: Payer: Self-pay | Admitting: Internal Medicine

## 2020-12-31 ENCOUNTER — Other Ambulatory Visit: Payer: Self-pay | Admitting: Internal Medicine

## 2020-12-31 DIAGNOSIS — Z1231 Encounter for screening mammogram for malignant neoplasm of breast: Secondary | ICD-10-CM

## 2021-01-01 MED ORDER — TETANUS-DIPHTH-ACELL PERTUSSIS 5-2.5-18.5 LF-MCG/0.5 IM SUSY: 0.5000 mL | PREFILLED_SYRINGE | Freq: Once | INTRAMUSCULAR | 0 refills | Status: AC

## 2021-01-07 ENCOUNTER — Other Ambulatory Visit: Payer: Self-pay | Admitting: Internal Medicine

## 2021-01-30 DIAGNOSIS — H401131 Primary open-angle glaucoma, bilateral, mild stage: Secondary | ICD-10-CM | POA: Diagnosis not present

## 2021-02-07 DIAGNOSIS — H2513 Age-related nuclear cataract, bilateral: Secondary | ICD-10-CM | POA: Diagnosis not present

## 2021-02-19 ENCOUNTER — Ambulatory Visit
Admission: RE | Admit: 2021-02-19 | Discharge: 2021-02-19 | Disposition: A | Discharge status: Home / Self Care | Payer: Medicare HMO | Source: Ambulatory Visit | Attending: Internal Medicine | Admitting: Internal Medicine

## 2021-02-19 ENCOUNTER — Other Ambulatory Visit: Payer: Self-pay

## 2021-02-19 DIAGNOSIS — Z1231 Encounter for screening mammogram for malignant neoplasm of breast: Secondary | ICD-10-CM | POA: Diagnosis not present

## 2021-03-12 DIAGNOSIS — H903 Sensorineural hearing loss, bilateral: Secondary | ICD-10-CM | POA: Diagnosis not present

## 2021-03-12 DIAGNOSIS — H6121 Impacted cerumen, right ear: Secondary | ICD-10-CM | POA: Diagnosis not present

## 2021-05-14 ENCOUNTER — Ambulatory Visit (INDEPENDENT_AMBULATORY_CARE_PROVIDER_SITE_OTHER): Payer: Medicare HMO

## 2021-05-14 VITALS — Ht 66.0 in | Wt 239.0 lb

## 2021-05-14 DIAGNOSIS — Z0000 Encounter for general adult medical examination without abnormal findings: Secondary | ICD-10-CM

## 2021-05-14 NOTE — Patient Instructions (Addendum)
[name] , Thank you for taking time to come for your Medicare Wellness Visit. I appreciate your ongoing commitment to your health goals. Please review the following plan we discussed and let me know if I can assist you in the future.   These are the goals we discussed:  Goals      Follow up with Primary Care Provider     As needed.        This is a list of the screening recommended for you and due dates:  Health Maintenance  Topic Date Due   COVID-19 Vaccine (5 - Booster for Pfizer series) 06/11/2021*   Colon Cancer Screening  09/29/2021   Mammogram  02/19/2022   Tetanus Vaccine  11/11/2030   Pneumonia Vaccine  Completed   Flu Shot  Completed   DEXA scan (bone density measurement)  Completed   Hepatitis C Screening: USPSTF Recommendation to screen - Ages 18-79 yo.  Completed   Zoster (Shingles) Vaccine  Completed   HPV Vaccine  Aged Out  *Topic was postponed. The date shown is not the original due date.    Advanced directives: on file.  Conditions/risks identified: none new.  Follow up in one year for your annual wellness visit.   Preventive Care 54 Years and Older, Female Preventive care refers to lifestyle choices and visits with your health care provider that can promote health and wellness. What does preventive care include? A yearly physical exam. This is also called an annual well check. Dental exams once or twice a year. Routine eye exams. Ask your health care provider how often you should have your eyes checked. Personal lifestyle choices, including: Daily care of your teeth and gums. Regular physical activity. Eating a healthy diet. Avoiding tobacco and drug use. Limiting alcohol use. Practicing safe sex. Taking low-dose aspirin every day. Taking vitamin and mineral supplements as recommended by your health care provider. What happens during an annual well check? The services and screenings done by your health care provider during your annual well  check will depend on your age, overall health, lifestyle risk factors, and family history of disease. Counseling  Your health care provider may ask you questions about your: Alcohol use. Tobacco use. Drug use. Emotional well-being. Home and relationship well-being. Sexual activity. Eating habits. History of falls. Memory and ability to understand (cognition). Work and work Statistician. Reproductive health. Screening  You may have the following tests or measurements: Height, weight, and BMI. Blood pressure. Lipid and cholesterol levels. These may be checked every 5 years, or more frequently if you are over 39 years old. Skin check. Lung cancer screening. You may have this screening every year starting at age 21 if you have a 30-pack-year history of smoking and currently smoke or have quit within the past 15 years. Fecal occult blood test (FOBT) of the stool. You may have this test every year starting at age 74. Flexible sigmoidoscopy or colonoscopy. You may have a sigmoidoscopy every 5 years or a colonoscopy every 10 years starting at age 83. Hepatitis C blood test. Hepatitis B blood test. Sexually transmitted disease (STD) testing. Diabetes screening. This is done by checking your blood sugar (glucose) after you have not eaten for a while (fasting). You may have this done every 1-3 years. Bone density scan. This is done to screen for osteoporosis. You may have this done starting at age 51. Mammogram. This may be done every 1-2 years. Talk to your health care provider about how often you should have  regular mammograms. Talk with your health care provider about your test results, treatment options, and if necessary, the need for more tests. Vaccines  Your health care provider may recommend certain vaccines, such as: Influenza vaccine. This is recommended every year. Tetanus, diphtheria, and acellular pertussis (Tdap, Td) vaccine. You may need a Td booster every 10 years. Zoster  vaccine. You may need this after age 21. Pneumococcal 13-valent conjugate (PCV13) vaccine. One dose is recommended after age 61. Pneumococcal polysaccharide (PPSV23) vaccine. One dose is recommended after age 59. Talk to your health care provider about which screenings and vaccines you need and how often you need them. This information is not intended to replace advice given to you by your health care provider. Make sure you discuss any questions you have with your health care provider. Document Released: 04/06/2015 Document Revised: 11/28/2015 Document Reviewed: 01/09/2015 Elsevier Interactive Patient Education  2017 [place] Prevention in the Home Falls can cause injuries. They can happen to people of all ages. There are many things you can do to make your home safe and to help prevent falls. What can I do on the outside of my home? Regularly fix the edges of walkways and driveways and fix any cracks. Remove anything that might make you trip as you walk through a door, such as a raised step or threshold. Trim any bushes or trees on the path to your home. Use bright outdoor lighting. Clear any walking paths of anything that might make someone trip, such as rocks or tools. Regularly check to see if handrails are loose or broken. Make sure that both sides of any steps have handrails. Any raised decks and porches should have guardrails on the edges. Have any leaves, snow, or ice cleared regularly. Use sand or salt on walking paths during winter. Clean up any spills in your garage right away. This includes oil or grease spills. What can I do in the bathroom? Use night lights. Install grab bars by the toilet and in the tub and shower. Do not use towel bars as grab bars. Use non-skid mats or decals in the tub or shower. If you need to sit down in the shower, use a plastic, non-slip stool. Keep the floor dry. Clean up any water that spills on the floor as soon as it happens. Remove  soap buildup in the tub or shower regularly. Attach bath mats securely with double-sided non-slip rug tape. Do not have throw rugs and other things on the floor that can make you trip. What can I do in the bedroom? Use night lights. Make sure that you have a light by your bed that is easy to reach. Do not use any sheets or blankets that are too big for your bed. They should not hang down onto the floor. Have a firm chair that has side arms. You can use this for support while you get dressed. Do not have throw rugs and other things on the floor that can make you trip. What can I do in the kitchen? Clean up any spills right away. Avoid walking on wet floors. Keep items that you use a lot in easy-to-reach places. If you need to reach something above you, use a strong step stool that has a grab bar. Keep electrical cords out of the way. Do not use floor polish or wax that makes floors slippery. If you must use wax, use non-skid floor wax. Do not have throw rugs and other things on the floor that  can make you trip. What can I do with my stairs? Do not leave any items on the stairs. Make sure that there are handrails on both sides of the stairs and use them. Fix handrails that are broken or loose. Make sure that handrails are as long as the stairways. Check any carpeting to make sure that it is firmly attached to the stairs. Fix any carpet that is loose or worn. Avoid having throw rugs at the top or bottom of the stairs. If you do have throw rugs, attach them to the floor with carpet tape. Make sure that you have a light switch at the top of the stairs and the bottom of the stairs. If you do not have them, ask someone to add them for you. What else can I do to help prevent falls? Wear shoes that: Do not have high heels. Have rubber bottoms. Are comfortable and fit you well. Are closed at the toe. Do not wear sandals. If you use a stepladder: Make sure that it is fully opened. Do not climb a  closed stepladder. Make sure that both sides of the stepladder are locked into place. Ask someone to hold it for you, if possible. Clearly mark and make sure that you can see: Any grab bars or handrails. First and last steps. Where the edge of each step is. Use tools that help you move around (mobility aids) if they are needed. These include: Canes. Walkers. Scooters. Crutches. Turn on the lights when you go into a dark area. Replace any light bulbs as soon as they burn out. Set up your furniture so you have a clear path. Avoid moving your furniture around. If any of your floors are uneven, fix them. If there are any pets around you, be aware of where they are. Review your medicines with your doctor. Some medicines can make you feel dizzy. This can increase your chance of falling. Ask your doctor what other things that you can do to help prevent falls. This information is not intended to replace advice given to you by your health care provider. Make sure you discuss any questions you have with your health care provider. Document Released: 01/04/2009 Document Revised: 08/16/2015 Document Reviewed: 04/14/2014 Elsevier Interactive Patient Education  2017 [employer].

## 2021-05-14 NOTE — Progress Notes (Addendum)
Subjective:   Savannah Hood is a 76 y.o. female who presents for Medicare Annual (Subsequent) preventive examination.  Review of Systems    No ROS.  Medicare Wellness Virtual Visit.  Visual/audio telehealth visit, UTA vital signs.   See social history for additional risk factors.   Cardiac Risk Factors include: advanced age (>24men, >36 women);hypertension     Objective:    Today's Vitals   05/14/21 1232  Weight: 239 lb (108.4 kg)  Height: 5\' 6"  (1.676 m)   Body mass index is 38.58 kg/m.  Advanced Directives 05/14/2021 06/13/2020 05/11/2020 05/11/2019  Does Patient Have a Medical Advance Directive? Yes Yes Yes Yes  Type of Paramedic of [place];Living will [place];Living will [place];Living will [place];Living will  Does patient want to make changes to medical advance directive? No - Patient declined No - Patient declined No - Patient declined No - Patient declined  Copy of [place] in Chart? No - copy requested - No - copy requested No - copy requested    Current Medications (verified) Outpatient Encounter Medications as of 05/14/2021  Medication Sig   amLODipine (NORVASC) 10 MG tablet TAKE ONE TABLET BY MOUTH EVERY DAY   Cholecalciferol (VITAMIN D3) 1000 units CAPS Take 2 capsules by mouth daily.    Clotrimazole 1 % OINT    COD LIVER OIL PO Take 1 capsule by mouth daily.    ergocalciferol (DRISDOL) 1.25 MG (50000 UT) capsule Take 1 capsule (50,000 Units total) by mouth once a week.   latanoprost (XALATAN) 0.005 % ophthalmic solution Place 1 drop into both eyes daily.   losartan (COZAAR) 100 MG tablet TAKE 1 TABLET BY MOUTH DAILY   LUTEIN PO Take 1 tablet by mouth daily.   Misc Natural Products (TURMERIC CURCUMIN) CAPS Take 1 capsule by mouth daily.   mometasone (ELOCON) 0.1 % lotion PLACE 2 TO 4 DROPS INTO RIGHT EAR EACH WEEK   Multiple Vitamins-Minerals  (PRESERVISION AREDS 2 PO) Take 1 tablet by mouth daily.   timolol (TIMOPTIC) 0.5 % ophthalmic solution INSTILL 1 DROP INTO LEFT EYE EVERY DAY   Trolamine Salicylate (ASPERCREME) 10 % LOTN    vitamin B-12 (CYANOCOBALAMIN) 1000 MCG tablet Take 1,000 mcg by mouth daily.   No facility-administered encounter medications on file as of 05/14/2021.    Allergies (verified) Patient has no known allergies.   History: Past Medical History:  Diagnosis Date   Arthritis    Colon polyps    Diabetes mellitus    pre-diabetes in past   Glaucoma    Hyperlipidemia    Hypertension    Obesity    Past Surgical History:  Procedure Laterality Date   ABDOMINAL HYSTERECTOMY     total, secondary to uterine polyps, Dr. Rayford Halsted   Family History  Problem Relation Age of Onset   Arthritis Mother    Arthritis Sister    Cancer Father        prostate   Diabetes Father    Diabetes Paternal Grandmother    Breast cancer Neg Hx    Social History   Socioeconomic History   Marital status: Married    Spouse name: Not on file   Number of children: Not on file   Years of education: Not on file   Highest education level: Not on file  Occupational History   Not on file  Tobacco Use   Smoking status: Never   Smokeless tobacco: Never  Substance and Sexual Activity   Alcohol use: Yes    Comment: rarely   Drug use: No   Sexual activity: Not on file  Other Topics Concern   Not on file  Social History Narrative   Lives in [place] with husband, has 2 children. 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 Pets - 2 dogs.    Social Determinants of Health   Financial Resource Strain: Not on file  Food Insecurity: Not on file  Transportation Needs: Not on file  Physical Activity: Not on file  Stress: Not on file  Social Connections: Not on file    Tobacco Counseling Counseling given: Not Answered   Clinical Intake:  Pre-visit preparation completed: Yes        Diabetes:  (Followed by PCP)  How often do  you need to have someone help you when you read instructions, pamphlets, or other written materials from your doctor or pharmacy?: 1 - Never  Interpreter Needed?: No      Activities of Daily Living In your present state of health, do you have any difficulty performing the following activities: 05/14/2021  Hearing? Y  Comment Hearing aid  Vision? N  Difficulty concentrating or making decisions? N  Walking or climbing stairs? Y  Comment Chronic back pain. Paces self.  Dressing or bathing? N  Doing errands, shopping? N  Preparing Food and eating ? N  Using the Toilet? N  In the past six months, have you accidently leaked urine? N  Do you have problems with loss of bowel control? N  Managing your Medications? N  Managing your Finances? N  Housekeeping or managing your Housekeeping? N  Some recent data might be hidden    Patient Care Team: Crecencio Mc, MD as PCP - General (Internal Medicine)  Indicate any recent Medical Services you may have received from other than [facility] providers in the past year (date may be approximate).     Assessment:   This is a routine wellness examination for Iasha.  Virtual Visit via Telephone Note  I connected with  Vana Arif on 05/14/21 at 12:30 PM EST by telephone and verified that I am speaking with the correct person using two identifiers.  Persons participating in the virtual visit: patient/Nurse Health Advisor   I discussed the limitations, risks, security and privacy concerns of performing an evaluation and management service by telephone and the availability of in person appointments. The patient expressed understanding and agreed to proceed.  Interactive audio and video telecommunications were attempted between this nurse and patient, however failed, due to patient having technical difficulties OR patient did not have access to video capability.  We continued and completed visit with audio only.  Some vital signs may be  absent or patient reported.   Hearing/Vision screen Hearing Screening - Comments:: Hearing aid, bilateral  Vision Screening - Comments:: Wears corrective lenses  They have seen their ophthalmologist.   Dietary issues and exercise activities discussed: Current Exercise Habits: Home exercise routine, Type of exercise: strength training/weights;stretching;walking, Time (Minutes): 30, Frequency (Times/Week): 5, Weekly Exercise (Minutes/Week): 150, Intensity: Mild Healthy diet Good water intake   Goals Addressed             This Visit's Progress    Follow up with Primary Care Provider       As needed.       Depression Screen PHQ 2/9 Scores 05/14/2021 06/07/2020 05/11/2020 05/11/2019 11/19/2018 04/10/2017 08/28/2015  PHQ - 2 Score 0 0 0 0 0 0 0  PHQ-  9 Score - 1 - - 0 1 -    Fall Risk Fall Risk  05/14/2021 06/07/2020 05/11/2020 05/11/2019 11/19/2018  Falls in the past year? 0 1 0 0 0  Number falls in past yr: 0 0 0 - -  Injury with Fall? - 0 0 - -  Follow up Falls evaluation completed Falls evaluation completed Falls evaluation completed Falls evaluation completed Falls evaluation completed   [place]: Home free of loose throw rugs in walkways, pet beds, electrical cords, etc? Yes  Adequate lighting in your home to reduce risk of falls? Yes   ASSISTIVE DEVICES UTILIZED TO PREVENT FALLS: Use of a cane, walker or w/c? No   TIMED UP AND GO: Was the test performed? No .   Cognitive Function:  Patient is alert and oriented x3.  Enjoys brain health stimulating activities.   6CIT Screen 05/11/2019  What Year? 0 points  What month? 0 points  What time? 0 points  Count back from 20 0 points  Months in reverse 0 points  Repeat phrase 0 points  Total Score 0    Immunizations Immunization History  Administered Date(s) Administered   Fluad Quad(high Dose 65+) 11/19/2018   Influenza Split 01/27/2011, 01/17/2016   Influenza, High Dose Seasonal PF  01/09/2017, 12/22/2017   Influenza,inj,Quad PF,6+ Mos 03/15/2013, 12/15/2013   Influenza-Unspecified 12/26/2019, 12/20/2020   PFIZER(Purple Top)SARS-COV-2 Vaccination 04/14/2019, 05/05/2019, 12/19/2019, 06/29/2020   Pneumococcal Conjugate-13 08/28/2015   Pneumococcal Polysaccharide-23 03/11/2013   Tdap 01/27/2011, 11/10/2020   Zoster Recombinat (Shingrix) 09/09/2017, 12/02/2017   Zoster, Live 11/20/2011   Screening Tests Health Maintenance  Topic Date Due   COVID-19 Vaccine (5 - Booster for [place] series) 06/11/2021 (Originally 08/24/2020)   COLONOSCOPY (Pts 45-36yrs Insurance coverage will need to be confirmed)  09/29/2021   MAMMOGRAM  02/19/2022   TETANUS/TDAP  11/11/2030   Pneumonia Vaccine 66+ Years old  Completed   INFLUENZA VACCINE  Completed   DEXA SCAN  Completed   Hepatitis C Screening  Completed   Zoster Vaccines- Shingrix  Completed   HPV VACCINES  Aged Out   Health Maintenance There are no preventive care reminders to display for this patient.  Lung Cancer Screening: (Low Dose CT Chest recommended if Age 53-80 years, 30 pack-year currently smoking OR have quit w/in 15years.) does not qualify.   Vision Screening: Recommended annual ophthalmology exams for early detection of glaucoma and other disorders of the eye.  Dental Screening: Recommended annual dental exams for proper oral hygiene.  Community Resource Referral / Chronic Care Management: CRR required this visit?  No   CCM required this visit?  No      Plan:   Keep all routine maintenance appointments.   I have personally reviewed and noted the following in the patients chart:   Medical and social history Use of alcohol, tobacco or illicit drugs  Current medications and supplements including opioid prescriptions.  Functional ability and status Nutritional status Physical activity Advanced directives List of other physicians Hospitalizations, surgeries, and ER visits in previous 12  months Vitals Screenings to include cognitive, depression, and falls Referrals and appointments  In addition, I have reviewed and discussed with patient certain preventive protocols, quality metrics, and best practice recommendations. A written personalized care plan for preventive services as well as general preventive health recommendations were provided to patient.     OBrien-Blaney, Keni Wafer L, LPN   0/35/0093      I have reviewed the above information and  agree with above.   Deborra Medina, MD

## 2021-06-03 ENCOUNTER — Other Ambulatory Visit: Payer: Self-pay | Admitting: Internal Medicine

## 2021-06-12 ENCOUNTER — Encounter: Payer: Self-pay | Admitting: Internal Medicine

## 2021-06-12 ENCOUNTER — Other Ambulatory Visit: Payer: Self-pay

## 2021-06-12 ENCOUNTER — Ambulatory Visit (INDEPENDENT_AMBULATORY_CARE_PROVIDER_SITE_OTHER): Payer: Medicare HMO | Admitting: Internal Medicine

## 2021-06-12 VITALS — BP 142/78 | HR 75 | Temp 97.9°F | Ht 66.0 in | Wt 234.2 lb

## 2021-06-12 DIAGNOSIS — I700 Atherosclerosis of aorta: Secondary | ICD-10-CM | POA: Diagnosis not present

## 2021-06-12 DIAGNOSIS — Z0000 Encounter for general adult medical examination without abnormal findings: Secondary | ICD-10-CM

## 2021-06-12 DIAGNOSIS — D126 Benign neoplasm of colon, unspecified: Secondary | ICD-10-CM | POA: Diagnosis not present

## 2021-06-12 DIAGNOSIS — R5383 Other fatigue: Secondary | ICD-10-CM | POA: Diagnosis not present

## 2021-06-12 DIAGNOSIS — Z974 Presence of external hearing-aid: Secondary | ICD-10-CM | POA: Insufficient documentation

## 2021-06-12 DIAGNOSIS — N1831 Chronic kidney disease, stage 3a: Secondary | ICD-10-CM

## 2021-06-12 DIAGNOSIS — N183 Chronic kidney disease, stage 3 unspecified: Secondary | ICD-10-CM | POA: Diagnosis not present

## 2021-06-12 DIAGNOSIS — E7849 Other hyperlipidemia: Secondary | ICD-10-CM

## 2021-06-12 DIAGNOSIS — E1169 Type 2 diabetes mellitus with other specified complication: Secondary | ICD-10-CM

## 2021-06-12 DIAGNOSIS — M5431 Sciatica, right side: Secondary | ICD-10-CM

## 2021-06-12 DIAGNOSIS — E785 Hyperlipidemia, unspecified: Secondary | ICD-10-CM

## 2021-06-12 DIAGNOSIS — Z1231 Encounter for screening mammogram for malignant neoplasm of breast: Secondary | ICD-10-CM

## 2021-06-12 DIAGNOSIS — I10 Essential (primary) hypertension: Secondary | ICD-10-CM | POA: Diagnosis not present

## 2021-06-12 DIAGNOSIS — Z6836 Body mass index (BMI) 36.0-36.9, adult: Secondary | ICD-10-CM

## 2021-06-12 DIAGNOSIS — E6609 Other obesity due to excess calories: Secondary | ICD-10-CM

## 2021-06-12 LAB — COMPREHENSIVE METABOLIC PANEL
ALT: 22 U/L (ref 0–35)
AST: 24 U/L (ref 0–37)
Albumin: 4.1 g/dL (ref 3.5–5.2)
Alkaline Phosphatase: 95 U/L (ref 39–117)
BUN: 19 mg/dL (ref 6–23)
CO2: 24 mEq/L (ref 19–32)
Calcium: 9.4 mg/dL (ref 8.4–10.5)
Chloride: 104 mEq/L (ref 96–112)
Creatinine, Ser: 1.07 mg/dL (ref 0.40–1.20)
GFR: 51.4 mL/min — ABNORMAL LOW (ref >60.00)
Glucose, Bld: 109 mg/dL — ABNORMAL HIGH (ref 70–99)
Potassium: 4.3 mEq/L (ref 3.5–5.1)
Sodium: 135 mEq/L (ref 135–145)
Total Bilirubin: 0.7 mg/dL (ref 0.2–1.2)
Total Protein: 6.9 g/dL (ref 6.0–8.3)

## 2021-06-12 LAB — CBC WITH DIFFERENTIAL/PLATELET
Basophils Absolute: 0 10*3/uL (ref 0.0–0.1)
Basophils Relative: 0.5 % (ref 0.0–3.0)
Eosinophils Absolute: 0.1 10*3/uL (ref 0.0–0.7)
Eosinophils Relative: 2.3 % (ref 0.0–5.0)
HCT: 40 % (ref 36.0–46.0)
Hemoglobin: 13.9 g/dL (ref 12.0–15.0)
Lymphocytes Relative: 18.9 % (ref 12.0–46.0)
Lymphs Abs: 1.2 10*3/uL (ref 0.7–4.0)
MCHC: 34.7 g/dL (ref 30.0–36.0)
MCV: 92.5 fl (ref 78.0–100.0)
Monocytes Absolute: 0.4 10*3/uL (ref 0.1–1.0)
Monocytes Relative: 7.1 % (ref 3.0–12.0)
Neutro Abs: 4.5 10*3/uL (ref 1.4–7.7)
Neutrophils Relative %: 71.2 % (ref 43.0–77.0)
Platelets: 177 10*3/uL (ref 150.0–400.0)
RBC: 4.32 Mil/uL (ref 3.87–5.11)
RDW: 12.7 % (ref 11.5–15.5)
WBC: 6.3 10*3/uL (ref 4.0–10.5)

## 2021-06-12 LAB — LIPID PANEL
Cholesterol: 219 mg/dL — ABNORMAL HIGH (ref 0–200)
HDL: 50.4 mg/dL (ref >39.00)
LDL Cholesterol: 138 mg/dL — ABNORMAL HIGH (ref 0–99)
NonHDL: 168.72
Total CHOL/HDL Ratio: 4
Triglycerides: 155 mg/dL — ABNORMAL HIGH (ref 0.0–149.0)
VLDL: 31 mg/dL (ref 0.0–40.0)

## 2021-06-12 LAB — LDL CHOLESTEROL, DIRECT: Direct LDL: 141 mg/dL

## 2021-06-12 LAB — TSH: TSH: 2.2 u[IU]/mL (ref 0.35–5.50)

## 2021-06-12 LAB — MICROALBUMIN / CREATININE URINE RATIO
Creatinine,U: 169.8 mg/dL
Microalb Creat Ratio: 1.3 mg/g (ref 0.0–30.0)
Microalb, Ur: 2.2 mg/dL — ABNORMAL HIGH (ref 0.0–1.9)

## 2021-06-12 MED ORDER — TIZANIDINE HCL 4 MG PO TABS: 4.0000 mg | ORAL_TABLET | Freq: Four times a day (QID) | ORAL | 0 refills | Status: DC | PRN

## 2021-06-12 MED ORDER — PREDNISONE 10 MG PO TABS: ORAL_TABLET | ORAL | 0 refills | Status: DC

## 2021-06-12 NOTE — Patient Instructions (Addendum)
I have sent a prednisone taper and tizanidine to [facility] to take on your trip to [place] in case you have a flare of your sciatica  ? ?Your annual mammogram has been ordered.  You are encouraged (required) to call to make your appointment at [facility]  ? ?Referral to dr Earlean Shawl has been made  ? ? ?

## 2021-06-12 NOTE — Assessment & Plan Note (Signed)
IMPROVED  With PT and  With use of CBD oil in the form of gummy bears.  ?

## 2021-06-12 NOTE — Progress Notes (Signed)
Patient ID: Savannah Hood, female    DOB: [birth year]  Age: 76 y.o. MRN: [id number] ? ?The patient is here for annual PREVENTIVE  examination and management of other chronic and acute problems. ? ?This visit occurred during the SARS-CoV-2 public health emergency.  Safety protocols were in place, including screening questions prior to the visit, additional usage of staff PPE, and extensive cleaning of exam room while observing appropriate contact time as indicated for disinfecting solutions.  ? ?  ?The risk factors are reflected in the social history. ? ?The roster of all physicians providing medical care to patient - is listed in the Snapshot section of the chart. ? ?Activities of daily living:  The patient is 100% independent in all ADLs: dressing, toileting, feeding as well as independent mobility ? ?Home safety : The patient has smoke detectors in the home. They wear seatbelts.  There are no firearms at home. There is no violence in the home.  ? ?There is no risks for hepatitis, STDs or HIV. There is no   history of blood transfusion. They have no travel history to infectious disease endemic areas of the world. ? ?The patient has seen their dentist in the last six month. They have seen their eye doctor in the last year. They admit to slight hearing difficulty with regard to whispered voices and some television programs.  They have deferred audiologic testing in the last year.  They do not  have excessive sun exposure. Discussed the need for sun protection: hats, long sleeves and use of sunscreen if there is significant sun exposure.  ? ?Diet: the importance of a healthy diet is discussed. They do have a healthy diet. ? ?The benefits of regular aerobic exercise were discussed. She walks 4 times per week ,  20 minutes.  ? ?Depression screen: there are no signs or vegative symptoms of depression- irritability, change in appetite, anhedonia, sadness/tearfullness. ? ?Cognitive assessment: the patient manages all  their financial and personal affairs and is actively engaged. They could relate day,date,year and events; recalled 2/3 objects at 3 minutes; performed clock-face test normally. ? ?The following portions of the patient's history were reviewed and updated as appropriate: allergies, current medications, past family history, past medical history,  past surgical history, past social history  and problem list. ? ?Visual acuity was not assessed per patient preference since she has regular follow up with her ophthalmologist. Hearing and body mass index were assessed and reviewed.  ? ?During the course of the visit the patient was educated and counseled about appropriate screening and preventive services including : fall prevention , diabetes screening, nutrition counseling, colorectal cancer screening, and recommended immunizations.   ? ?CC: The primary encounter diagnosis was Primary hypertension. Diagnoses of Other fatigue, Encounter for screening mammogram for malignant neoplasm of breast, Hyperlipidemia with target low density lipoprotein (LDL) cholesterol less than 100 mg/dL, Chronic sciatica, right, Tubular adenoma of colon, Wears hearing aid in both ears, Stage 3a chronic kidney disease ([place]), Familial hyperlipidemia, high LDL, Class 2 obesity due to excess calories without serious comorbidity with body mass index (BMI) of 36.0 to 36.9 in adult, and Routine general medical examination at a health care facility were also pertinent to this visit. ? ?1) HTN:  Hypertension: patient checks blood pressure twice weekly at home.  Readings have been for the most part < 140/80 at rest . Patient is following a reduce salt diet most days and is taking medications as prescribed  ? ?History ?Savannah Hood has a past  medical history of Arthritis, Colon polyps, Diabetes mellitus, Glaucoma, Hyperlipidemia, Hypertension, and Obesity.  ? ?She has a past surgical history that includes Abdominal hysterectomy.  ? ?Her family history includes  Arthritis in her mother and sister; Cancer in her father; Diabetes in her father and paternal grandmother.She reports that she has never smoked. She has never used smokeless tobacco. She reports current alcohol use. She reports that she does not use drugs. ? ?Outpatient Medications Prior to Visit  ?Medication Sig Dispense Refill  ? amLODipine (NORVASC) 10 MG tablet TAKE ONE TABLET BY MOUTH EVERY DAY 90 tablet 3  ? Cholecalciferol (VITAMIN D3) 1000 units CAPS Take 2 capsules by mouth daily.     ? Clotrimazole 1 % OINT     ? COD LIVER OIL PO Take 1 capsule by mouth daily.     ? latanoprost (XALATAN) 0.005 % ophthalmic solution Place 1 drop into both eyes daily.    ? losartan (COZAAR) 100 MG tablet TAKE 1 TABLET BY MOUTH DAILY 90 tablet 1  ? LUTEIN PO Take 1 tablet by mouth daily.    ? Misc Natural Products (TURMERIC CURCUMIN) CAPS Take 1 capsule by mouth daily.    ? mometasone (ELOCON) 0.1 % lotion PLACE 2 TO 4 DROPS INTO RIGHT EAR EACH WEEK    ? Multiple Vitamins-Minerals (PRESERVISION AREDS 2 PO) Take 1 tablet by mouth daily.    ? timolol (TIMOPTIC) 0.5 % ophthalmic solution INSTILL 1 DROP INTO LEFT EYE EVERY DAY  6  ? Trolamine Salicylate (ASPERCREME) 10 % LOTN     ? vitamin B-12 (CYANOCOBALAMIN) 1000 MCG tablet Take 1,000 mcg by mouth daily.    ? ergocalciferol (DRISDOL) 1.25 MG (50000 UT) capsule Take 1 capsule (50,000 Units total) by mouth once a week. 4 capsule 3  ? ?No facility-administered medications prior to visit.  ? ? ?Review of Systems ? ?Patient denies headache, fevers, malaise, unintentional weight loss, skin rash, eye pain, sinus congestion and sinus pain, sore throat, dysphagia,  hemoptysis , cough, dyspnea, wheezing, chest pain, palpitations, orthopnea, edema, abdominal pain, nausea, melena, diarrhea, constipation, flank pain, dysuria, hematuria, urinary  Frequency, nocturia, numbness, tingling, seizures,  Focal weakness, Loss of consciousness,  Tremor, insomnia, depression, anxiety, and suicidal  ideation.   ? ? ?Objective:  ?BP (!) 142/78 (BP Location: Left Arm, Patient Position: Sitting, Cuff Size: Large)   Pulse 75   Temp 97.9 ?F (36.6 ?C) (Oral)   Ht '5\' 6"'$  (1.676 m)   Wt 234 lb 3.2 oz (106.2 kg)   SpO2 98%   BMI 37.80 kg/m?  ? ?Physical Exam ? ?General appearance: alert, cooperative and appears stated age ?Head: Normocephalic, without obvious abnormality, atraumatic ?Eyes: conjunctivae/corneas clear. PERRL, EOM's intact. Fundi benign. ?Ears: normal TM's and external ear canals both ears ?Nose: Nares normal. Septum midline. Mucosa normal. No drainage or sinus tenderness. ?Throat: lips, mucosa, and tongue normal; teeth and gums normal ?Neck: no adenopathy, no carotid bruit, no JVD, supple, symmetrical, trachea midline and thyroid not enlarged, symmetric, no tenderness/mass/nodules ?Lungs: clear to auscultation bilaterally ?Breasts: normal appearance, no masses or tenderness ?Heart: regular rate and rhythm, S1, S2 normal, no murmur, click, rub or gallop ?Abdomen: soft, non-tender; bowel sounds normal; no masses,  no organomegaly ?Extremities: extremities normal, atraumatic, no cyanosis or edema ?Pulses: 2+ and symmetric ?Skin: Skin color, texture, turgor normal. No rashes or lesions ?Neurologic: Alert and oriented X 3, normal strength and tone. Normal symmetric reflexes. Normal coordination and gait.    ? ?Assessment &  Plan:  ? ?Problem List Items Addressed This Visit   ? ? Wears hearing aid in both ears  ? Tubular adenoma of colon  ?  She is due for follow up colonoscopy.  referral in progress ?  ?  ? Relevant Orders  ? Ambulatory referral to Gastroenterology  ? Routine general medical examination at a health care facility  ?  age appropriate education and counseling updated, referrals for preventative services and immunizations addressed, dietary and smoking counseling addressed, most recent labs reviewed.  I have personally reviewed and have noted: ?  ?1) the patient's medical and social  history ?2) The pt's use of alcohol, tobacco, and illicit drugs ?3) The patient's current medications and supplements ?4) Functional ability including ADL's, fall risk, home safety risk, hearing and visual impairment ?5) D

## 2021-06-13 NOTE — Assessment & Plan Note (Signed)
She is due for follow up colonoscopy.  referral in progress ?

## 2021-06-13 NOTE — Assessment & Plan Note (Signed)
Well controlled on current regimen of maximal doses of amlodipine and losartan  Renal function stable, no changes today. ? ?Lab Results  ?Component Value Date  ? CREATININE 1.07 06/12/2021  ? ?Lab Results  ?Component Value Date  ? NA 135 06/12/2021  ? K 4.3 06/12/2021  ? CL 104 06/12/2021  ? CO2 24 06/12/2021  ? ? ?

## 2021-06-13 NOTE — Assessment & Plan Note (Signed)

## 2021-06-13 NOTE — Assessment & Plan Note (Signed)
Renal function is at  baseline with avoidance of NSAIDs.  She is on an ARB for control of hypertension, and avoiding NSAID use ? ?Lab Results  ?Component Value Date  ? CREATININE 1.07 06/12/2021  ? ?Lab Results  ?Component Value Date  ? NA 135 06/12/2021  ? K 4.3 06/12/2021  ? CL 104 06/12/2021  ? CO2 24 06/12/2021  ? ? ?

## 2021-06-13 NOTE — Assessment & Plan Note (Signed)
She has aortic atherosclerosis ; statin therapy is advised regardless of risk computed with FRC or AHA.  She continues to decline statin therapy  ? ?Lab Results  ?Component Value Date  ? CHOL 219 (H) 06/12/2021  ? HDL 50.40 06/12/2021  ? LDLCALC 138 (H) 06/12/2021  ? LDLDIRECT 141.0 06/12/2021  ? TRIG 155.0 (H) 06/12/2021  ? CHOLHDL 4 06/12/2021  ? ?

## 2021-06-13 NOTE — Assessment & Plan Note (Signed)
I have congratulated her in reduction of   BMI and encouraged  Continued weight loss with goal of 10% of body weigh over the next 6 months using a low glycemic index diet and regular exercise a minimum of 5 days per week.    

## 2021-06-28 ENCOUNTER — Telehealth: Payer: Medicare HMO | Admitting: Physician Assistant

## 2021-06-28 DIAGNOSIS — R1030 Lower abdominal pain, unspecified: Secondary | ICD-10-CM | POA: Diagnosis not present

## 2021-06-28 DIAGNOSIS — R195 Other fecal abnormalities: Secondary | ICD-10-CM | POA: Diagnosis not present

## 2021-06-28 DIAGNOSIS — R197 Diarrhea, unspecified: Secondary | ICD-10-CM | POA: Diagnosis not present

## 2021-06-28 MED ORDER — ONDANSETRON HCL 4 MG PO TABS: 4.0000 mg | ORAL_TABLET | Freq: Three times a day (TID) | ORAL | 0 refills | Status: DC | PRN

## 2021-06-28 MED ORDER — AZITHROMYCIN 500 MG PO TABS: 500.0000 mg | ORAL_TABLET | Freq: Every day | ORAL | 0 refills | Status: DC

## 2021-06-28 NOTE — Progress Notes (Signed)
We are sorry that you are not feeling well.  Here is how we plan to help! ? ?Based on what you have shared with me it looks like you have Acute Infectious Diarrhea. ? ?Most cases of acute diarrhea are due to infections with virus and bacteria and are self-limited conditions lasting less than 14 days. ? ?For your symptoms you may take Imodium 2 mg tablets that are over the counter at your local pharmacy. Take two tablet now and then one after each loose stool up to 6 a day.  ?Antibiotics are not needed for most people with diarrhea. ? ?Optional: Zofran 4 mg 1 tablet every 8 hours as needed for nausea and vomiting ? ?Optional: I have prescribed azithromycin 500 mg daily for 3 days ? ?HOME CARE ?We recommend changing your diet to help with your symptoms for the next few days. ?Drink plenty of fluids that contain water salt and sugar. Sports drinks such as Gatorade may help.  ?You may try broths, soups, bananas, applesauce, soft breads, mashed potatoes or crackers.  ?You are considered infectious for as long as the diarrhea continues. Hand washing or use of alcohol based hand sanitizers is recommend. ?It is best to stay out of work or school until your symptoms stop.  ? ?GET HELP RIGHT AWAY ?If you have dark yellow colored urine or do not pass urine frequently you should drink more fluids.   ?If your symptoms worsen  ?If you feel like you are going to pass out (faint) ?You have a new problem ? ?MAKE SURE YOU  ?Understand these instructions. ?Will watch your condition. ?Will get help right away if you are not doing well or get worse. ? ?Thank you for choosing an e-visit. ? ?Your e-visit answers were reviewed by a board certified advanced clinical practitioner to complete your personal care plan. Depending upon the condition, your plan could have included both over the counter or prescription medications. ? ?Please review your pharmacy choice. Make sure the pharmacy is open so you can pick up prescription now. If there is  a problem, you may contact your provider through [employer] and have the prescription routed to another pharmacy.  Your safety is important to [place]. If you have drug allergies check your prescription carefully.  ? ?For the next 24 hours you can use MyChart to ask questions about today's visit, request a non-urgent call back, or ask for a work or school excuse. ?You will get an email in the next two days asking about your experience. I hope that your e-visit has been valuable and will speed your recovery. ? ?I provided 5 minutes of non face-to-face time during this encounter for chart review and documentation.  ? ?

## 2021-07-03 ENCOUNTER — Encounter: Payer: Self-pay | Admitting: Internal Medicine

## 2021-07-04 ENCOUNTER — Encounter: Payer: Self-pay | Admitting: Internal Medicine

## 2021-07-05 ENCOUNTER — Inpatient Hospital Stay
Admission: EM | Admit: 2021-07-05 | Discharge: 2021-07-07 | DRG: 392 | Disposition: A | Discharge status: Home / Self Care | Payer: Medicare HMO | Attending: Internal Medicine | Admitting: Internal Medicine

## 2021-07-05 ENCOUNTER — Ambulatory Visit: Payer: Medicare HMO

## 2021-07-05 ENCOUNTER — Encounter: Payer: Self-pay | Admitting: Emergency Medicine

## 2021-07-05 ENCOUNTER — Emergency Department: Payer: Medicare HMO

## 2021-07-05 ENCOUNTER — Other Ambulatory Visit: Payer: Self-pay

## 2021-07-05 DIAGNOSIS — Z833 Family history of diabetes mellitus: Secondary | ICD-10-CM | POA: Diagnosis not present

## 2021-07-05 DIAGNOSIS — Z91041 Radiographic dye allergy status: Secondary | ICD-10-CM | POA: Diagnosis not present

## 2021-07-05 DIAGNOSIS — M1990 Unspecified osteoarthritis, unspecified site: Secondary | ICD-10-CM | POA: Diagnosis present

## 2021-07-05 DIAGNOSIS — E1122 Type 2 diabetes mellitus with diabetic chronic kidney disease: Secondary | ICD-10-CM | POA: Diagnosis present

## 2021-07-05 DIAGNOSIS — K5792 Diverticulitis of intestine, part unspecified, without perforation or abscess without bleeding: Secondary | ICD-10-CM | POA: Diagnosis not present

## 2021-07-05 DIAGNOSIS — K760 Fatty (change of) liver, not elsewhere classified: Secondary | ICD-10-CM | POA: Diagnosis present

## 2021-07-05 DIAGNOSIS — Z79899 Other long term (current) drug therapy: Secondary | ICD-10-CM

## 2021-07-05 DIAGNOSIS — Z90710 Acquired absence of both cervix and uterus: Secondary | ICD-10-CM

## 2021-07-05 DIAGNOSIS — Z86010 Personal history of colonic polyps: Secondary | ICD-10-CM

## 2021-07-05 DIAGNOSIS — K5732 Diverticulitis of large intestine without perforation or abscess without bleeding: Principal | ICD-10-CM | POA: Diagnosis present

## 2021-07-05 DIAGNOSIS — E871 Hypo-osmolality and hyponatremia: Secondary | ICD-10-CM

## 2021-07-05 DIAGNOSIS — R109 Unspecified abdominal pain: Secondary | ICD-10-CM | POA: Diagnosis not present

## 2021-07-05 DIAGNOSIS — I10 Essential (primary) hypertension: Secondary | ICD-10-CM | POA: Diagnosis present

## 2021-07-05 DIAGNOSIS — E785 Hyperlipidemia, unspecified: Secondary | ICD-10-CM | POA: Diagnosis not present

## 2021-07-05 DIAGNOSIS — Z8261 Family history of arthritis: Secondary | ICD-10-CM

## 2021-07-05 DIAGNOSIS — I129 Hypertensive chronic kidney disease with stage 1 through stage 4 chronic kidney disease, or unspecified chronic kidney disease: Secondary | ICD-10-CM | POA: Diagnosis not present

## 2021-07-05 DIAGNOSIS — Z6837 Body mass index (BMI) 37.0-37.9, adult: Secondary | ICD-10-CM | POA: Diagnosis not present

## 2021-07-05 DIAGNOSIS — R7989 Other specified abnormal findings of blood chemistry: Secondary | ICD-10-CM | POA: Diagnosis present

## 2021-07-05 DIAGNOSIS — N1831 Chronic kidney disease, stage 3a: Secondary | ICD-10-CM | POA: Diagnosis present

## 2021-07-05 DIAGNOSIS — E8809 Other disorders of plasma-protein metabolism, not elsewhere classified: Secondary | ICD-10-CM | POA: Diagnosis present

## 2021-07-05 DIAGNOSIS — H409 Unspecified glaucoma: Secondary | ICD-10-CM | POA: Diagnosis present

## 2021-07-05 DIAGNOSIS — Z8371 Family history of colonic polyps: Secondary | ICD-10-CM

## 2021-07-05 DIAGNOSIS — E669 Obesity, unspecified: Secondary | ICD-10-CM | POA: Diagnosis not present

## 2021-07-05 DIAGNOSIS — Z8042 Family history of malignant neoplasm of prostate: Secondary | ICD-10-CM

## 2021-07-05 HISTORY — DX: Hypo-osmolality and hyponatremia: E87.1

## 2021-07-05 HISTORY — DX: Diverticulitis of intestine, part unspecified, without perforation or abscess without bleeding: K57.92

## 2021-07-05 LAB — URINALYSIS, ROUTINE W REFLEX MICROSCOPIC
Bilirubin Urine: NEGATIVE
Glucose, UA: NEGATIVE mg/dL
Hgb urine dipstick: NEGATIVE
Ketones, ur: 5 mg/dL — AB
Nitrite: NEGATIVE
Protein, ur: 100 mg/dL — AB
Specific Gravity, Urine: 1.021 (ref 1.005–1.030)
pH: 5 (ref 5.0–8.0)

## 2021-07-05 LAB — CBC
HCT: 35.5 % — ABNORMAL LOW (ref 36.0–46.0)
Hemoglobin: 11.9 g/dL — ABNORMAL LOW (ref 12.0–15.0)
MCH: 30.8 pg (ref 26.0–34.0)
MCHC: 33.5 g/dL (ref 30.0–36.0)
MCV: 92 fL (ref 80.0–100.0)
Platelets: 225 10*3/uL (ref 150–400)
RBC: 3.86 MIL/uL — ABNORMAL LOW (ref 3.87–5.11)
RDW: 12.1 % (ref 11.5–15.5)
WBC: 19.5 10*3/uL — ABNORMAL HIGH (ref 4.0–10.5)
nRBC: 0 % (ref 0.0–0.2)

## 2021-07-05 LAB — COMPREHENSIVE METABOLIC PANEL
ALT: 59 U/L — ABNORMAL HIGH (ref 0–44)
AST: 39 U/L (ref 15–41)
Albumin: 2.9 g/dL — ABNORMAL LOW (ref 3.5–5.0)
Alkaline Phosphatase: 174 U/L — ABNORMAL HIGH (ref 38–126)
Anion gap: 10 (ref 5–15)
BUN: 13 mg/dL (ref 8–23)
CO2: 22 mmol/L (ref 22–32)
Calcium: 8.5 mg/dL — ABNORMAL LOW (ref 8.9–10.3)
Chloride: 98 mmol/L (ref 98–111)
Creatinine, Ser: 0.97 mg/dL (ref 0.44–1.00)
GFR, Estimated: >60 mL/min (ref >60)
Glucose, Bld: 157 mg/dL — ABNORMAL HIGH (ref 70–99)
Potassium: 3.7 mmol/L (ref 3.5–5.1)
Sodium: 130 mmol/L — ABNORMAL LOW (ref 135–145)
Total Bilirubin: 1.2 mg/dL (ref 0.3–1.2)
Total Protein: 6.5 g/dL (ref 6.5–8.1)

## 2021-07-05 LAB — LIPASE, BLOOD: Lipase: 27 U/L (ref 11–51)

## 2021-07-05 MED ORDER — ENOXAPARIN SODIUM 60 MG/0.6ML IJ SOSY
0.5000 mg/kg | PREFILLED_SYRINGE | INTRAMUSCULAR | Status: DC
  Administered 2021-07-05 – 2021-07-06 (×2): 52.5 mg via SUBCUTANEOUS
  Filled 2021-07-05 (×2): qty 0.6

## 2021-07-05 MED ORDER — OCUVITE-LUTEIN PO CAPS
ORAL_CAPSULE | Freq: Every day | ORAL | Status: DC
Start: 2021-07-05 — End: 2021-07-05
  Filled 2021-07-05: qty 1

## 2021-07-05 MED ORDER — LATANOPROST 0.005 % OP SOLN: 1.0000 [drp] | Freq: Every day | OPHTHALMIC | Status: DC

## 2021-07-05 MED ORDER — ADULT MULTIVITAMIN W/MINERALS CH
1.0000 | ORAL_TABLET | Freq: Every day | ORAL | Status: DC
  Administered 2021-07-06 – 2021-07-07 (×2): 1 via ORAL
  Filled 2021-07-05 (×3): qty 1

## 2021-07-05 MED ORDER — LOSARTAN POTASSIUM 50 MG PO TABS
100.0000 mg | ORAL_TABLET | Freq: Every day | ORAL | Status: DC
  Administered 2021-07-06 – 2021-07-07 (×2): 100 mg via ORAL
  Filled 2021-07-05 (×2): qty 2

## 2021-07-05 MED ORDER — AMLODIPINE BESYLATE 5 MG PO TABS
10.0000 mg | ORAL_TABLET | Freq: Every day | ORAL | Status: DC
  Administered 2021-07-06 – 2021-07-07 (×2): 10 mg via ORAL
  Filled 2021-07-05 (×2): qty 2

## 2021-07-05 MED ORDER — MORPHINE SULFATE (PF) 2 MG/ML IV SOLN
2.0000 mg | INTRAVENOUS | Status: DC | PRN
  Administered 2021-07-06: 2 mg via INTRAVENOUS
  Filled 2021-07-05 (×2): qty 1

## 2021-07-05 MED ORDER — ONDANSETRON HCL 4 MG PO TABS: 4.0000 mg | ORAL_TABLET | Freq: Four times a day (QID) | ORAL | Status: DC | PRN

## 2021-07-05 MED ORDER — MORPHINE SULFATE (PF) 4 MG/ML IV SOLN
4.0000 mg | Freq: Once | INTRAVENOUS | Status: AC
  Administered 2021-07-05: 4 mg via INTRAVENOUS
  Filled 2021-07-05: qty 1

## 2021-07-05 MED ORDER — SODIUM CHLORIDE 0.9 % IV SOLN: INTRAVENOUS | Status: AC

## 2021-07-05 MED ORDER — ENSURE ENLIVE PO LIQD
237.0000 mL | Freq: Two times a day (BID) | ORAL | Status: DC
  Administered 2021-07-05 – 2021-07-06 (×3): 237 mL via ORAL

## 2021-07-05 MED ORDER — SODIUM CHLORIDE 0.9 % IV BOLUS
500.0000 mL | Freq: Once | INTRAVENOUS | Status: AC
  Administered 2021-07-05: 500 mL via INTRAVENOUS

## 2021-07-05 MED ORDER — PIPERACILLIN-TAZOBACTAM 3.375 G IVPB
3.3750 g | Freq: Three times a day (TID) | INTRAVENOUS | Status: DC
  Administered 2021-07-05 – 2021-07-07 (×6): 3.375 g via INTRAVENOUS
  Filled 2021-07-05 (×6): qty 50

## 2021-07-05 MED ORDER — ONDANSETRON HCL 4 MG/2ML IJ SOLN: 4.0000 mg | Freq: Four times a day (QID) | INTRAMUSCULAR | Status: DC | PRN

## 2021-07-05 MED ORDER — ONDANSETRON HCL 4 MG/2ML IJ SOLN
4.0000 mg | INTRAMUSCULAR | Status: AC
  Administered 2021-07-05: 4 mg via INTRAVENOUS
  Filled 2021-07-05: qty 2

## 2021-07-05 MED ORDER — LATANOPROST 0.005 % OP SOLN
1.0000 [drp] | Freq: Every day | OPHTHALMIC | Status: DC
  Administered 2021-07-05 – 2021-07-06 (×2): 1 [drp] via OPHTHALMIC
  Filled 2021-07-05: qty 2.5

## 2021-07-05 MED ORDER — SODIUM CHLORIDE 0.9 % IV SOLN
2.0000 g | Freq: Once | INTRAVENOUS | Status: AC
  Administered 2021-07-05: 2 g via INTRAVENOUS
  Filled 2021-07-05: qty 20

## 2021-07-05 NOTE — Assessment & Plan Note (Signed)
Blood pressure is stable ?Continue amlodipine and Cozaar ?

## 2021-07-05 NOTE — Assessment & Plan Note (Signed)
Most likely secondary to poor oral intake ?Expect improvement in serum sodium levels with hydration ?

## 2021-07-05 NOTE — Assessment & Plan Note (Signed)
Patient has a BMI of 37 ?Complicates overall prognosis and care ?Lifestyle modification and exercise has been discussed with patient in detail. ?

## 2021-07-05 NOTE — ED Notes (Signed)
Informed RN bed assigned 

## 2021-07-05 NOTE — ED Notes (Signed)
Pt to CT

## 2021-07-05 NOTE — Progress Notes (Signed)
Initial Nutrition Assessment ? ?DOCUMENTATION CODES:  ? ?Obesity unspecified ? ?INTERVENTION:  ? ?-Ensure Enlive po BID, each supplement provides 350 kcal and 20 grams of protein ?-MVI with minerals daily ? ?NUTRITION DIAGNOSIS:  ? ?Inadequate oral intake related to poor appetite as evidenced by per patient/family report. ? ?GOAL:  ? ?Patient will meet greater than or equal to 90% of their needs ? ?MONITOR:  ? ?PO intake, Supplement acceptance, Diet advancement, Labs, Weight trends, Skin, I & O's ? ?REASON FOR ASSESSMENT:  ? ?Malnutrition Screening Tool ?  ? ?ASSESSMENT:  ? ?Savannah Hood is a 76 y.o. female with medical history significant for obesity with BMI of 37.79 kg/m2, hypertension and dyslipidemia who presents to the ER for evaluation of abdominal pain mostly in the left lower quadrant with radiation to the suprapubic area for over 10 days.  She rates her pain a 7 x 10 in intensity at its worst associated with loose stools containing mucus.  Over the last 2 days she has noted suprapubic pain as well as burning sensation when she voids.  She complains of a low-grade fever with a Tmax at home of 100.1. ? ?Pt acute diverticulitis.  ? ?Reviewed I/O's: +500 ml x 24 hours ? ?Spoke with pt at bedside, who reports feeling a little bit better today. She has not eaten anything today, but just ordered lunch (sherbet and juice). Pt endorses decreased appetite over the past 11 days secondary to GI issues (nausea, abdominal pain, and gas). She has been following the [employer] (mainly rice soup, juices, and applesauce). Prior to acute illness, pt shares that she recently started intermittent fasting (2 meals per day) to assist with weight management.  ?  ?Pt denies any weight loss. She reports her UBW is around 230#. Reviewed wt hx; pt has experienced a 2% wt loss over the past 2 months, which is not significant for time frame.  ? ?Discussed importance of good meal and supplement intake to promote healing. Pt  amenable to supplements, but is unsure if she will tolerate them as she has a strong gag reflex.  ? ?Medications reviewed and include 0.9% sodium chloride infusion @ 100 ml/hr.  ? ?Labs reviewed: Na: 130.   ? ?NUTRITION - FOCUSED PHYSICAL EXAM: ? ?Flowsheet Row Most Recent Value  ?Orbital Region No depletion  ?Upper Arm Region Mild depletion  ?Thoracic and Lumbar Region No depletion  ?Buccal Region No depletion  ?Temple Region No depletion  ?Clavicle Bone Region No depletion  ?Clavicle and Acromion Bone Region No depletion  ?Scapular Bone Region No depletion  ?Dorsal Hand No depletion  ?Patellar Region No depletion  ?Anterior Thigh Region No depletion  ?Posterior Calf Region No depletion  ?Edema (RD Assessment) Mild  ?Hair Reviewed  ?Eyes Reviewed  ?Mouth Reviewed  ?Skin Reviewed  ?Nails Reviewed  ? ?  ? ? ?Diet Order:   ?Diet Order   ? ?       ?  Diet full liquid Room service appropriate? Yes; Fluid consistency: Thin  Diet effective now       ?  ? ?  ?  ? ?  ? ? ?EDUCATION NEEDS:  ? ?Education needs have been addressed ? ?Skin:  Skin Assessment: Reviewed RN Assessment ? ?Last BM:  06/25/21 ? ?Height:  ? ?Ht Readings from Last 1 Encounters:  ?07/05/21 '5\' 6"'$  (1.676 m)  ? ? ?Weight:  ? ?Wt Readings from Last 1 Encounters:  ?07/05/21 106.2 kg  ? ? ?Ideal Body Weight:  59.1 kg ? ?BMI:  Body mass index is 37.79 kg/m?. ? ?Estimated Nutritional Needs:  ? ?Kcal:  1750-1950 ? ?Protein:  90-105 grams ? ?Fluid:  > 1.7 L ? ? ? ?Loistine Chance, RD, LDN, CDCES ?Registered Dietitian II ?Certified Diabetes Care and Education Specialist ?Please refer to [employer] [facility] for RD and/or RD on-call/weekend/after hours pager  ?

## 2021-07-05 NOTE — ED Provider Notes (Signed)
? ?[facility] ?Provider Note ? ? ? Event Date/Time  ? First MD Initiated Contact with Patient 07/05/21 1007   ?  (approximate) ? ? ?History  ? ?Abdominal Pain ? ? ?HPI ? ?Savannah Hood is a 76 y.o. female   who on review of primary care note from March 22 of this year Arthritis, Colon polyps, Diabetes mellitus, Glaucoma, Hyperlipidemia, Hypertension, and Obesity.  ? ?Also reviewed note from visit on April 7, the note is somewhat abbreviated, but does do note the patient is being treated for acute infectious diarrhea and was given a prescription for Zofran and azithromycin.  Patient does report that she has utilized these. ? ?About 11 days ago started developing a crampy pain in her right lower abdomen.  This was associated with change in her stools to smaller volume loose slightly mucousy.  Those have since resolved, but she continues to have pain mostly in the very right lower abdomen and slight across the left.  She is also noticed the last 2 days that when she urinates she gets a burning feeling in her urine appears more cloudy ? ?No fever except she reports yesterday felt like she had a fever of about 100 Fahrenheit.  Some nausea but no vomiting.  Bowel movement yesterday was small but formed.  She is passing gas normally.  Feels a sense of fullness especially across her lower abdomen. ? ? ? ?No chest pain.  No trouble breathing.  Drinking fluids, working and staying well-hydrated but decrease in solid food intake but still eating a "BRAT" diet ? ?  ? ? ?Physical Exam  ? ?Triage Vital Signs: ?ED Triage Vitals  ?Enc Vitals Group  ?   BP 07/05/21 1009 136/64  ?   Pulse Rate 07/05/21 1009 85  ?   Resp 07/05/21 1009 18  ?   Temp --   ?   Temp src --   ?   SpO2 07/05/21 1009 97 %  ?   Weight 07/05/21 1005 234 lb 2.1 oz (106.2 kg)  ?   Height 07/05/21 1005 '5\' 6"'$  (1.676 m)  ?   Head Circumference --   ?   Peak Flow --   ?   Pain Score 07/05/21 1005 9  ?   Pain Loc --   ?   Pain Edu? --   ?    Excl. in [place]? --   ? ? ?Most recent vital signs: ?Vitals:  ? 07/05/21 1100 07/05/21 1115  ?BP: (!) 120/51   ?Pulse: 73 80  ?Resp: 13 15  ?SpO2: 95% 94%  ? ? ? ?General: Awake, no distress.  She is very pleasant escorted by family. ?CV:  Good peripheral perfusion.  Normal heart tones ?Resp:  Normal effort.  Clear lungs ?Abd:  No distention.  Reports mild discomfort to palpation in the lower abdomen bilaterally.  No obvious masses.  Some abdominal obesity is noted though and makes examination somewhat unreliable.  No upper abdominal pain in the upper quadrants bilaterally.  No guarding.  No rebound or guarding.  No evidence of acute peritonitis. ?Other:  Strong lower extremity dorsalis pedis and posterior tibial pulses bilaterally. ? ? ?ED Results / Procedures / Treatments  ? ?Labs ?(all labs ordered are listed, but only abnormal results are displayed) ?Labs Reviewed  ?COMPREHENSIVE METABOLIC PANEL - Abnormal; Notable for the following components:  ?    Result Value  ? Sodium 130 (*)   ? Glucose, Bld 157 (*)   ?  Calcium 8.5 (*)   ? Albumin 2.9 (*)   ? ALT 59 (*)   ? Alkaline Phosphatase 174 (*)   ? All other components within normal limits  ?CBC - Abnormal; Notable for the following components:  ? WBC 19.5 (*)   ? RBC 3.86 (*)   ? Hemoglobin 11.9 (*)   ? HCT 35.5 (*)   ? All other components within normal limits  ?URINALYSIS, ROUTINE W REFLEX MICROSCOPIC - Abnormal; Notable for the following components:  ? Color, Urine AMBER (*)   ? APPearance CLOUDY (*)   ? Ketones, ur 5 (*)   ? Protein, ur 100 (*)   ? Leukocytes,Ua SMALL (*)   ? Bacteria, UA RARE (*)   ? All other components within normal limits  ?URINE CULTURE  ?LIPASE, BLOOD  ? ? ?RADIOLOGY ? ?Personally reviewed and interpreted the patient's CT imaging of the abdomen pelvis for gross abnormality, and note what appears to be notable inflammatory changes involving the sigmoid.  See radiologist report for additional detail ? ?CT ABDOMEN PELVIS WO CONTRAST ? ?Result  Date: 07/05/2021 ?CLINICAL DATA:  RLQ abdominal pain (Age >= 14y) R lower pain for 11 days, now feeling of fullness and bilateral lower abd pain EXAM: CT ABDOMEN AND PELVIS WITHOUT CONTRAST TECHNIQUE: Multidetector CT imaging of the abdomen and pelvis was performed following the standard protocol without IV contrast. RADIATION DOSE REDUCTION: This exam was performed according to the departmental dose-optimization program which includes automated exposure control, adjustment of the mA and/or kV according to patient size and/or use of iterative reconstruction technique. COMPARISON:  None. FINDINGS: Lower chest: No acute abnormality. Hepatobiliary: No focal liver abnormality is seen. No gallstones, gallbladder wall thickening, or biliary dilatation. Pancreas: Unremarkable. Spleen: Unremarkable. Adrenals/Urinary Tract: Adrenals, kidneys, and poorly distended bladder are unremarkable. Stomach/Bowel: Stomach is within normal limits. Small bowel is normal in caliber. There is stool throughout much of the colon. Sigmoid colon wall thickening with adjacent fat infiltration and fluid. Normal appendix. Vascular/Lymphatic: Mild atherosclerosis.  No enlarged nodes. Reproductive: Status post hysterectomy. No adnexal masses. Other: No free air.  No acute abnormality of the abdominal wall. 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 IMPRESSION: Sigmoid colon wall thickening with adjacent inflammatory changes. May reflect infectious/inflammatory colitis or diverticulitis (no definite culprit diverticulum identified). Colonoscopy is recommended to exclude neoplastic wall thickening. Electronically Signed   By: Macy Mis M.D.   On: 07/05/2021 10:48   ? ? ? ? ? ? ?PROCEDURES: ? ?Critical Care performed: No ? ?Procedures ? ? ?MEDICATIONS ORDERED IN ED: ?Medications  ?cefTRIAXone (ROCEPHIN) 2 g in sodium chloride 0.9 % 100 mL IVPB (has no administration in time range)   ?morphine (PF) 4 MG/ML injection 4 mg (has no administration in time range)  ?PreserVision AREDS 2 CAPS (has no administration in time range)  ?latanoprost (XALATAN) 0.005 % ophthalmic solution 1 drop (has no administration in time range)  ?amLODipine (NORVASC) tablet 10 mg (has no administration in time range)  ?losartan (COZAAR) tablet 100 mg (has no administration in time range)  ?enoxaparin (LOVENOX) injection 40 mg (has no administration in time range)  ?0.9 %  sodium chloride infusion (has no administration in time range)  ?morphine (PF) 2 MG/ML injection 2 mg (has no administration in time range)  ?ondansetron (ZOFRAN) tablet 4 mg (has no administration in time range)  ?  Or  ?ondansetron (ZOFRAN) injection 4 mg (has no administration in time range)  ?sodium chloride 0.9 %  bolus 500 mL (0 mLs Intravenous Stopped 07/05/21 1155)  ?ondansetron (ZOFRAN) injection 4 mg (4 mg Intravenous Given 07/05/21 1100)  ? ? ? ?IMPRESSION / MDM / ASSESSMENT AND PLAN / ED COURSE  ?I reviewed the triage vital signs and the nursing notes. ?             ?               ? ?Differential diagnosis includes but is not limited to, abdominal perforation, aortic dissection, cholecystitis, appendicitis, diverticulitis, colitis, esophagitis/gastritis, kidney stone, pyelonephritis, urinary tract infection, aortic aneurysm. All are considered in decision and treatment plan. Based upon the patient's presentation and risk factors, low pretest probability for acute vascular abnormality, we will proceed with antiemetic, hydration, check abdominal labs and also urinalysis.  She does report also dysuria for the last 2 days preceded by several days of sense of fullness in decreased volume of stool.  Denies rectal pain and does not feel impacted ?----------------------------------------- ?12:03 PM on 07/05/2021 ?----------------------------------------- ? ?Leukocytosis but no hypotension noted fever or heart rate greater than 90 or tachypnea.   Patient does not meet sepsis criteria at this time ? ? ?The patient is on the cardiac monitor to evaluate for evidence of arrhythmia and/or significant heart rate changes. ? ?Clinical Course as of 07/05/21 1206

## 2021-07-05 NOTE — ED Notes (Signed)
Dr Agbata at bedside. 

## 2021-07-05 NOTE — ED Triage Notes (Signed)
Pt comes into the ED via POV c/o lower abd pain that has been ongoing x 11 days.  Pt admits to little fecal matter being produced other than mucous.  Pt denies any N/v.  Pt currently in NAD at this time with even and unlabored respirations.  Pt does have a h/o diverticulosis.   ?

## 2021-07-05 NOTE — H&P (Addendum)
?History and Physical  ? ? ?Patient: Savannah Hood KYH:[id number] DOB: 08/10/1945 ?DOA: 07/05/2021 ?DOS: the patient was seen and examined on 07/05/2021 ?PCP: Crecencio Mc, MD  ?Patient coming from: Home ? ?Chief Complaint:  ?Chief Complaint  ?Patient presents with  ? Abdominal Pain  ? ?HPI: Savannah Hood is a 76 y.o. female with medical history significant for obesity with BMI of 37.79 kg/m2, hypertension and dyslipidemia who presents to the ER for evaluation of abdominal pain mostly in the left lower quadrant with radiation to the suprapubic area for over 10 days.  She rates her pain a 7 x 10 in intensity at its worst associated with loose stools containing mucus.  Over the last 2 days she has noted suprapubic pain as well as burning sensation when she voids.  She complains of a low-grade fever with a Tmax at home of 100.1. ?Oral intake has been poor but she denies having any nausea or vomiting. ?She was prescribed Zofran and azithromycin by her primary care provider for possible acute infectious diarrhea without any improvement in her symptoms. ?She denies having any chest pain, no shortness of breath, no headache, no blurred vision, no focal deficits, no leg swelling, no dizziness or lightheadedness. ?Review of Systems: As mentioned in the history of present illness. All other systems reviewed and are negative. ?Past Medical History:  ?Diagnosis Date  ? Arthritis   ? Colon polyps   ? Diabetes mellitus   ? pre-diabetes in past  ? Glaucoma   ? Hyperlipidemia   ? Hypertension   ? Obesity   ? ?Past Surgical History:  ?Procedure Laterality Date  ? ABDOMINAL HYSTERECTOMY    ? total, secondary to uterine polyps, Dr. Rayford Halsted  ? ?Social History:  reports that she has never smoked. She has never used smokeless tobacco. She reports current alcohol use. She reports that she does not use drugs. ? ?Allergies  ?Allergen Reactions  ? Iodinated Contrast Media Other (See Comments)  ?  Burning sensation in her veins,  and nausea  ? ? ?Family History  ?Problem Relation Age of Onset  ? Arthritis Mother   ? Arthritis Sister   ? Cancer Father   ?     prostate  ? Diabetes Father   ? Diabetes Paternal Grandmother   ? Breast cancer Neg Hx   ? ? ?Prior to Admission medications   ?Medication Sig Start Date End Date Taking? Authorizing Provider  ?amLODipine (NORVASC) 10 MG tablet TAKE ONE TABLET BY MOUTH EVERY DAY 01/08/21   Crecencio Mc, MD  ?azithromycin (ZITHROMAX) 500 MG tablet Take 1 tablet (500 mg total) by mouth daily. 06/28/21   Mar Daring, PA-C  ?Cholecalciferol (VITAMIN D3) 1000 units CAPS Take 2 capsules by mouth daily.     [provider]  ?Clotrimazole 1 % OINT  10/22/16   [provider]  ?COD LIVER OIL PO Take 1 capsule by mouth daily.     [provider]  ?latanoprost (XALATAN) 0.005 % ophthalmic solution Place 1 drop into both eyes daily.    [provider]  ?losartan (COZAAR) 100 MG tablet TAKE 1 TABLET BY MOUTH DAILY 06/03/21   Crecencio Mc, MD  ?LUTEIN PO Take 1 tablet by mouth daily.    [provider]  ?Misc Natural Products (TURMERIC CURCUMIN) CAPS Take 1 capsule by mouth daily.    [provider]  ?mometasone (ELOCON) 0.1 % lotion PLACE 2 TO 4 DROPS INTO RIGHT EAR [facility] WEEK 04/06/20  [provider]  ?Multiple Vitamins-Minerals (PRESERVISION AREDS 2 PO) Take 1 tablet by mouth daily.    [provider]  ?ondansetron (ZOFRAN) 4 MG tablet Take 1 tablet (4 mg total) by mouth every 8 (eight) hours as needed for nausea or vomiting. 06/28/21   Mar Daring, PA-C  ?predniSONE (DELTASONE) 10 MG tablet 6 tablets on Day 1 , then reduce by 1 tablet daily until gone 06/12/21   Crecencio Mc, MD  ?timolol (TIMOPTIC) 0.5 % ophthalmic solution INSTILL 1 DROP INTO LEFT EYE EVERY DAY 12/12/15   [provider]  ?tiZANidine (ZANAFLEX) 4 MG tablet Take 1 tablet (4 mg total) by mouth every 6 (six) hours as needed for muscle spasms. 06/12/21    Crecencio Mc, MD  ?Trolamine Salicylate (ASPERCREME) 10 % LOTN  01/18/20   [provider]  ?vitamin B-12 (CYANOCOBALAMIN) 1000 MCG tablet Take 1,000 mcg by mouth daily.    [provider]  ? ? ?Physical Exam: ?Vitals:  ? 07/05/21 1030 07/05/21 1045 07/05/21 1100 07/05/21 1115  ?BP:   (!) 120/51   ?Pulse: 78 75 73 80  ?Resp:   13 15  ?Temp:      ?TempSrc:      ?SpO2: 95% 98% 95% 94%  ?Weight:      ?Height:      ? ?Physical Exam ?Vitals and nursing note reviewed.  ?Constitutional:   ?   Appearance: She is obese.  ?HENT:  ?   Head: Normocephalic and atraumatic.  ?   Mouth/Throat:  ?   Mouth: Mucous membranes are moist.  ?Cardiovascular:  ?   Rate and Rhythm: Normal rate and regular rhythm.  ?Pulmonary:  ?   Effort: Pulmonary effort is normal.  ?   Breath sounds: Normal breath sounds.  ?Abdominal:  ?   General: Bowel sounds are normal.  ?   Palpations: Abdomen is soft.  ?   Tenderness: There is abdominal tenderness in the suprapubic area and left lower quadrant.  ?   Comments: Central adiposity  ?Skin: ?   General: Skin is warm and dry.  ?Neurological:  ?   General: No focal deficit present.  ?   Mental Status: She is alert and oriented to person, place, and time.  ?Psychiatric:     ?   Mood and Affect: Mood normal.     ?   Behavior: Behavior normal.  ? ? ?Data Reviewed: ?Relevant notes from primary care and specialist visits, past discharge summaries as available in EHR, including Care Everywhere. ?Prior diagnostic testing as pertinent to current admission diagnoses ?Updated medications and problem lists for reconciliation ?ED course, including vitals, labs, imaging, treatment and response to treatment ?Triage notes, nursing and pharmacy notes and ED provider's notes ?Notable results as noted in HPI ?Labs reviewed.  Sodium 130, potassium 3.7, chloride 98, bicarb 22, glucose 157, BUN 13, creatinine 0.97, white count 19.5, hemoglobin 11.9, hematocrit 35.5, platelet count 225, ?Urine analysis with  small leukocyte esterase ?CT scan of abdomen and pelvis Sigmoid colon wall thickening with adjacent inflammatory changes. ?May reflect infectious/inflammatory colitis or diverticulitis (no ?definite culprit diverticulum identified). Colonoscopy is recommended to exclude neoplastic wall thickening. ?There are no new results to review at this time. ? ?Assessment and Plan: ?* Acute diverticulitis ?Patient presents for evaluation of abdominal pain mostly in the left lower quadrant and suprapubic area associated with low-grade fever, chills and diarrhea. ?CT scan of abdomen and pelvis shows sigmoid colon wall thickening with adjacent inflammatory  changes. May reflect infectious/inflammatory colitis or diverticulitis (no ?definite culprit diverticulum identified). Colonoscopy is ?recommended to exclude neoplastic wall thickening. ?Patient has marked leukocytosis ?Will place patient on IV Zosyn ?Supportive care with IV fluids, pain control and antiemetics. ?Discussed with Dr Virgina Jock from GI who will set patient up for an outpatient colonoscopy ? ?Obesity ?Patient has a BMI of 37 ?Complicates overall prognosis and care ?Lifestyle modification and exercise has been discussed with patient in detail. ? ?Hypertension ?Blood pressure is stable ?Continue amlodipine and Cozaar ? ?Hyponatremia ?Most likely secondary to poor oral intake ?Expect improvement in serum sodium levels with hydration ? ? ? ? ? Advance Care Planning:   Code Status: Full Code  ? ?Consults: None ? ?Family Communication: Greater than 50% of time was spent discussing plan of care with patient at the bedside.  All questions and concerns have been addressed.  She verbalizes understanding and agrees with the plan ? ?Severity of Illness: ?The appropriate patient status for this patient is INPATIENT. Inpatient status is judged to be reasonable and necessary in order to provide the required intensity of service to ensure the patient's safety. The patient's presenting  symptoms, physical exam findings, and initial radiographic and laboratory data in the context of their chronic comorbidities is felt to place them at high risk for further clinical deterioration. Kathlen Mody

## 2021-07-05 NOTE — Assessment & Plan Note (Signed)
Patient presents for evaluation of abdominal pain mostly in the left lower quadrant and suprapubic area associated with low-grade fever, chills and diarrhea. ?CT scan of abdomen and pelvis shows sigmoid colon wall thickening with adjacent inflammatory changes. May reflect infectious/inflammatory colitis or diverticulitis (no ?definite culprit diverticulum identified). Colonoscopy is ?recommended to exclude neoplastic wall thickening. ?Patient has marked leukocytosis ?Will place patient on IV Zosyn ?Supportive care with IV fluids, pain control and antiemetics. ?Discussed with Dr Virgina Jock from GI who will set patient up for an outpatient colonoscopy ?

## 2021-07-05 NOTE — Consult Note (Addendum)
? ? ?GI Inpatient Consult Note ? ?Reason for Consult: Acute Uncomplicated Diverticulitis ?  ?Attending Requesting Consult: Dr. Delman Kitten ? ?History of Present Illness: ?Savannah Hood is a 76 y.o. female seen for evaluation of acute uncomplicated diverticulitis at the request of Dr. Delman Kitten. Patient has a PMH of arthritis, diabetes mellitus, CKD, hyperlipidemia, hypertension, obesity, adenomatous colon polyps, fatty liver. Patient presented to the [facility] ED for chief complaint of 11-day history of lower abdominal pain, suprapubic area, passage of loose, mucous, small pieces of fecal matter. She took a 3 day course of Azithromycin on 06/27/2021. Upon presentation to the ED, vital signs were 98.1, 85, 18, 136/64, SPO2 97%. Labs were significant for WBC 19.5, hemoglobin 11.9, sodium 130, ALT 59, alkaline phosphatase 174, blood glucose 157, albumin 2.9, UA amber cloudy urine small leukocytes, 100 protein. Imaging studies revealed CT of the abdomen pelvis without contrast with sigmoid wall colon thickening with adjacent inflammatory changes.  May reflect infectious/inflammatory colitis or diverticulitis.  No focal liver abnormality is seen.  ? ?Savannah Hood reports that she developed crampy abdominal pain rated 7 out of 10 at the worst in her left lower quadrant, but currently mild generalized discomfort into suprapubic area associated with change in bowel habits to smaller volume slightly loose mucousy and no bigger than the tip of her finger.  She also noted urinary burning and suprapubic discomfort.  She felt like she had a fever yesterday of 100.  She had nausea no vomiting.  She is passing gas normally and has noted no passage of gas when she voids. She has been eating a brat diet, drinking fluids, she has a history of numerous colon polyps and had abdominal surgery to remove a polyp at age 80 in 63.  She has had serial colonoscopies through [facility] Dr. Liliane Channel group most recently 2018.  She is due for  repeat surveillance colonoscopy. She has difficulty with the bowel preps and prefers tablet form.  She does have a history of abdominal hysterectomy secondary to uterine polyps. She has intact gallbladder and no prior history of appendectomy or C-section. No use of NSAIDs.  ? ?Last Colonoscopy: 2018: Dr. Earlean Shawl: Follow-up colon polyps: Impression: 1 cm inflammatory polyp ascending colon, 5 mm sigmoid tubular adenoma polyp, mild sigmoid diverticulosis.  ? ?Past Medical History:  ?Past Medical History:  ?Diagnosis Date  ? Arthritis   ? Colon polyps   ? Diabetes mellitus   ? pre-diabetes in past  ? Glaucoma   ? Hyperlipidemia   ? Hypertension   ? Obesity   ?  ?Problem List: ?Patient Active Problem List  ? Diagnosis Date Noted  ? Acute diverticulitis 07/05/2021  ? Hyponatremia 07/05/2021  ? Wears hearing aid in both ears 06/12/2021  ? Abdominal aortic atherosclerosis ([place]) 06/09/2020  ? Glaucoma 06/07/2020  ? Chronic sciatica, right 06/07/2020  ? Tubular adenoma of colon 11/19/2018  ? Digital mucinous cyst of finger 11/21/2017  ? Vitamin D deficiency 03/01/2016  ? Routine general medical examination at a health care facility 08/28/2015  ? Medicare annual wellness visit, subsequent 10/24/2013  ? CKD (chronic kidney disease) stage 3, GFR 30-59 ml/min (HCC) 10/24/2013  ? Candida rash of groin 08/09/2012  ? Familial hyperlipidemia, high LDL 11/20/2011  ? Obesity 12/23/2010  ? Hypertension 12/23/2010  ?  ?Past Surgical History: ?Past Surgical History:  ?Procedure Laterality Date  ? ABDOMINAL HYSTERECTOMY    ? total, secondary to uterine polyps, Dr. Rayford Halsted  ?  ?Allergies: ?Allergies  ?Allergen Reactions  ?  Iodinated Contrast Media Other (See Comments)  ?  Burning sensation in her veins, and nausea  ?  ?Home Medications: ?Medications Prior to Admission  ?Medication Sig Dispense Refill Last Dose  ? amLODipine (NORVASC) 10 MG tablet TAKE ONE TABLET BY MOUTH EVERY DAY 90 tablet 3 07/05/2021  ? timolol (TIMOPTIC) 0.5 % ophthalmic  solution INSTILL 1 DROP INTO LEFT EYE EVERY DAY  6 07/05/2021  ? azithromycin (ZITHROMAX) 500 MG tablet Take 1 tablet (500 mg total) by mouth daily. 3 tablet 0   ? Cholecalciferol (VITAMIN D3) 1000 units CAPS Take 2 capsules by mouth daily.      ? Clotrimazole 1 % OINT      ? COD LIVER OIL PO Take 1 capsule by mouth daily.      ? latanoprost (XALATAN) 0.005 % ophthalmic solution Place 1 drop into both eyes daily.     ? losartan (COZAAR) 100 MG tablet TAKE 1 TABLET BY MOUTH DAILY 90 tablet 1   ? LUTEIN PO Take 1 tablet by mouth daily.     ? Misc Natural Products (TURMERIC CURCUMIN) CAPS Take 1 capsule by mouth daily.     ? mometasone (ELOCON) 0.1 % lotion PLACE 2 TO 4 DROPS INTO RIGHT EAR EACH WEEK     ? Multiple Vitamins-Minerals (PRESERVISION AREDS 2 PO) Take 1 tablet by mouth daily.     ? ondansetron (ZOFRAN) 4 MG tablet Take 1 tablet (4 mg total) by mouth every 8 (eight) hours as needed for nausea or vomiting. 20 tablet 0   ? predniSONE (DELTASONE) 10 MG tablet 6 tablets on Day 1 , then reduce by 1 tablet daily until gone (Patient not taking: Reported on 07/05/2021) 21 tablet 0 Not Taking  ? tiZANidine (ZANAFLEX) 4 MG tablet Take 1 tablet (4 mg total) by mouth every 6 (six) hours as needed for muscle spasms. 30 tablet 0   ? Trolamine Salicylate (ASPERCREME) 10 % LOTN      ? vitamin B-12 (CYANOCOBALAMIN) 1000 MCG tablet Take 1,000 mcg by mouth daily.     ? ?Home medication reconciliation was completed with the patient.  ? ?Scheduled Inpatient Medications: ?  ? [START ON 07/06/2021] amLODipine  10 mg Oral Daily  ? enoxaparin (LOVENOX) injection  0.5 mg/kg Subcutaneous Q24H  ? [START ON 07/06/2021] latanoprost  1 drop Both Eyes QHS  ? [START ON 07/06/2021] losartan  100 mg Oral Daily  ? PreserVision AREDS 2   Oral Daily  ? ? ?Continuous Inpatient Infusions: ?  ? sodium chloride    ? piperacillin-tazobactam (ZOSYN)  IV    ? ? ?PRN Inpatient Medications:  ?morphine injection, ondansetron **OR** ondansetron (ZOFRAN)  IV ? ?Family History: ?family history includes Arthritis in her mother and sister; Cancer in her father; Diabetes in her father and paternal grandmother.  Father with colon polyps. Negative for inflammatory bowel disorders, GI malignancy, or solid organ transplantation. ? ?Social History:  ? reports that she has never smoked. She has never used smokeless tobacco. She reports current alcohol use. She reports that she does not use drugs.  ? ?Review of Systems: ?Constitutional: Weight is stable.  ?Eyes: No changes in vision. ?ENT: No oral lesions, sore throat.  ?GI: see HPI.  ?Heme/Lymph: No easy bruising.  ?CV: No chest pain.  ?GU: No hematuria.  ?Integumentary: No rashes.  ?Neuro: No headaches.  ?Psych: No depression/anxiety.  ?Endocrine: No heat/cold intolerance.  ?Allergic/Immunologic: No urticaria.  ?Resp: No cough, SOB.  ?Musculoskeletal: No joint swelling.  ?  ?  Physical Examination: ?BP (!) 120/52 (BP Location: Right Arm)   Pulse 78   Temp 99.5 ?F (37.5 ?C)   Resp 18   Ht '5\' 6"'$  (1.676 m)   Wt 106.2 kg   SpO2 98%   BMI 37.79 kg/m?  ?Gen: NAD, alert and oriented x 4, Well appearing, relaxed ?HEENT: EOMI ?Neck: supple, no JVD or thyromegaly ?Chest: CTA bilaterally, no wheezes, crackles, or other adventitious sounds ?CV: RRR, no m/g/c/r ?Abd: soft, mild generalized abdominal tenderness- more so suprapubic area, ND, +BS in all four quadrants; no HSM, guarding, rigidity, or rebound tenderness ?Ext: no edema ?Skin: no rash or lesions noted ?Lymph: no LAD ? ?Data: ?Lab Results  ?Component Value Date  ? WBC 19.5 (H) 07/05/2021  ? HGB 11.9 (L) 07/05/2021  ? HCT 35.5 (L) 07/05/2021  ? MCV 92.0 07/05/2021  ? PLT 225 07/05/2021  ? ?Recent Labs  ?Lab 07/05/21 ?1006  ?HGB 11.9*  ? ?Lab Results  ?Component Value Date  ? NA 130 (L) 07/05/2021  ? K 3.7 07/05/2021  ? CL 98 07/05/2021  ? CO2 22 07/05/2021  ? BUN 13 07/05/2021  ? CREATININE 0.97 07/05/2021  ? ?Lab Results  ?Component Value Date  ? ALT 59 (H) 07/05/2021  ? AST  39 07/05/2021  ? ALKPHOS 174 (H) 07/05/2021  ? BILITOT 1.2 07/05/2021  ? ?No results for input(s): APTT, INR, PTT in the last 168 hours. ?Assessment/Plan: ?Ms. Barman is a 76 y.o. female  with history of  diabete

## 2021-07-05 NOTE — Progress Notes (Signed)
Pharmacy Antibiotic Note ? ?Savannah Hood is a 76 y.o. female admitted on 07/05/2021 with  Intra-abdominal infection .  Pharmacy has been consulted for Zosyn dosing. ? ?Plan: ?Zosyn 3.375g IV q8h (4 hour infusion). ? ?Height: '5\' 6"'$  (167.6 cm) ?Weight: 106.2 kg (234 lb 2.1 oz) ?IBW/kg (Calculated) : 59.3 ? ?Temp (24hrs), Avg:98.3 ?F (36.8 ?C), Min:98.3 ?F (36.8 ?C), Max:98.3 ?F (36.8 ?C) ? ?Recent Labs  ?Lab 07/05/21 ?1006  ?WBC 19.5*  ?CREATININE 0.97  ?  ?Estimated Creatinine Clearance: 63.7 mL/min (by C-G formula based on SCr of 0.97 mg/dL).   ? ?Allergies  ?Allergen Reactions  ? Iodinated Contrast Media Other (See Comments)  ?  Burning sensation in her veins, and nausea  ? ? ?Antimicrobials this admission: ?Ceftriaxone 4/14 x 1 ?Zosyn 4/14 >>  ? ?Dose adjustments this admission: ? ?Microbiology results: ?4/14 UCx: pending  ? ? ?Thank you for allowing pharmacy to be a part of this patient?s care. ? ?Paulina Fusi, PharmD, BCPS ?07/05/2021 ?12:14 PM ? ? ?

## 2021-07-06 DIAGNOSIS — K5792 Diverticulitis of intestine, part unspecified, without perforation or abscess without bleeding: Secondary | ICD-10-CM | POA: Diagnosis not present

## 2021-07-06 LAB — BASIC METABOLIC PANEL
Anion gap: 8 (ref 5–15)
BUN: 13 mg/dL (ref 8–23)
CO2: 24 mmol/L (ref 22–32)
Calcium: 8.1 mg/dL — ABNORMAL LOW (ref 8.9–10.3)
Chloride: 98 mmol/L (ref 98–111)
Creatinine, Ser: 1.05 mg/dL — ABNORMAL HIGH (ref 0.44–1.00)
GFR, Estimated: 56 mL/min — ABNORMAL LOW (ref >60)
Glucose, Bld: 158 mg/dL — ABNORMAL HIGH (ref 70–99)
Potassium: 3.9 mmol/L (ref 3.5–5.1)
Sodium: 130 mmol/L — ABNORMAL LOW (ref 135–145)

## 2021-07-06 LAB — URINE CULTURE: Culture: <10000 — AB

## 2021-07-06 LAB — CBC
HCT: 33.1 % — ABNORMAL LOW (ref 36.0–46.0)
Hemoglobin: 11.1 g/dL — ABNORMAL LOW (ref 12.0–15.0)
MCH: 31 pg (ref 26.0–34.0)
MCHC: 33.5 g/dL (ref 30.0–36.0)
MCV: 92.5 fL (ref 80.0–100.0)
Platelets: 190 10*3/uL (ref 150–400)
RBC: 3.58 MIL/uL — ABNORMAL LOW (ref 3.87–5.11)
RDW: 12 % (ref 11.5–15.5)
WBC: 18.8 10*3/uL — ABNORMAL HIGH (ref 4.0–10.5)
nRBC: 0 % (ref 0.0–0.2)

## 2021-07-06 MED ORDER — SODIUM CHLORIDE 0.9 % IV SOLN: INTRAVENOUS | Status: DC

## 2021-07-06 MED ORDER — TIMOLOL MALEATE 0.5 % OP SOLN
1.0000 [drp] | Freq: Two times a day (BID) | OPHTHALMIC | Status: DC
  Administered 2021-07-06 – 2021-07-07 (×3): 1 [drp] via OPHTHALMIC
  Filled 2021-07-06: qty 5

## 2021-07-06 NOTE — Progress Notes (Signed)
Mobility Specialist - Progress Note ? ? ? 07/06/21 1200  ?Mobility  ?Activity Ambulated independently in hallway;Stood at bedside;Dangled on edge of bed  ?Level of Assistance Independent  ?Assistive Device None  ?Distance Ambulated (ft) 320 ft  ?Activity Response Tolerated well  ?$Mobility charge 1 Mobility  ? ? ?Pre-mobility: HR, BP, SpO2 ?During mobility: HR, BP, SpO2 .302 ?Post-mobility: HR, BP, SPO2 ? ?Pt supine upon arrival using RA. Pt voices 6/10 abdominal pain but motivated to ambulate. Completes bed mobility to sit EOB ModI and STS indep , ambulates indep 2 laps around nursing station. Pt returns to bed with needs in reach and RN notified. ? ?Savannah Hood ?Mobility Specialist ?07/06/21, 12:29 PM ? ? ? ?

## 2021-07-06 NOTE — Progress Notes (Signed)
?PROGRESS NOTE ? ? ? ?Savannah Hood  FOY:[id number] DOB: December 19, 1945 DOA: 07/05/2021 ?PCP: Crecencio Mc, MD  ? ? ?Brief Narrative:  ?Savannah Hood is a 76 y.o. female with medical history significant for obesity with BMI of 37.79 kg/m2, hypertension and dyslipidemia who presents to the ER for evaluation of abdominal pain mostly in the left lower quadrant with radiation to the suprapubic area for over 10 days.  She rates her pain a 7 x 10 in intensity at its worst associated with loose stools containing mucus.  Over the last 2 days she has noted suprapubic pain as well as burning sensation when she voids.  She complains of a low-grade fever with a Tmax at home of 100.1. ?Oral intake has been poor but she denies having any nausea or vomiting. ? ? ? ?Consultants:  ?GI ? ?Procedures:  ? ?Antimicrobials:  ?  ?Rocephin x1 ?Zosyn 4/14>>>>> ? ?Subjective: ?Still with abdominal pain.  Not much p.o. intake as she has not much appetite.  No other symptoms.  No nausea or vomiting ? ?Objective: ?Vitals:  ? 07/05/21 1510 07/05/21 2017 07/06/21 0645 07/06/21 0826  ?BP: (!) 130/52 (!) 116/52 130/62 (!) 113/49  ?Pulse: 77 76 78 79  ?Resp: '18 18  18  '$ ?Temp: 99.1 ?F (37.3 ?C) 99 ?F (37.2 ?C) 98.9 ?F (37.2 ?C) 98.2 ?F (36.8 ?C)  ?TempSrc:  Oral Oral Oral  ?SpO2: 96% 95% 94% 94%  ?Weight:      ?Height:      ? ? ?Intake/Output Summary (Last 24 hours) at 07/06/2021 0827 ?Last data filed at 07/05/2021 1553 ?Gross per 24 hour  ?Intake 841.44 ml  ?Output --  ?Net 841.44 ml  ? ?Filed Weights  ? 07/05/21 1005  ?Weight: 106.2 kg  ? ? ?Examination: ?Calm, NAD ?Cta no w/r ?Reg s1/s2 no gallop ?Soft benign mild soreness with palpation ?No edema ?Aaoxox3  ?Mood and affect appropriate in current setting  ? ? ?Data Reviewed: I have personally reviewed following labs and imaging studies ? ?CBC: ?Recent Labs  ?Lab 07/05/21 ?1006 07/06/21 ?7672  ?WBC 19.5* 18.8*  ?HGB 11.9* 11.1*  ?HCT 35.5* 33.1*  ?MCV 92.0 92.5  ?PLT 225 190  ? ?Basic  Metabolic Panel: ?Recent Labs  ?Lab 07/05/21 ?1006 07/06/21 ?0947  ?NA 130* 130*  ?K 3.7 3.9  ?CL 98 98  ?CO2 22 24  ?GLUCOSE 157* 158*  ?BUN 13 13  ?CREATININE 0.97 1.05*  ?CALCIUM 8.5* 8.1*  ? ?GFR: ?Estimated Creatinine Clearance: 58.8 mL/min (A) (by C-G formula based on SCr of 1.05 mg/dL (H)). ?Liver Function Tests: ?Recent Labs  ?Lab 07/05/21 ?1006  ?AST 39  ?ALT 59*  ?ALKPHOS 174*  ?BILITOT 1.2  ?PROT 6.5  ?ALBUMIN 2.9*  ? ?Recent Labs  ?Lab 07/05/21 ?1006  ?LIPASE 27  ? ?No results for input(s): AMMONIA in the last 168 hours. ?Coagulation Profile: ?No results for input(s): INR, PROTIME in the last 168 hours. ?Cardiac Enzymes: ?No results for input(s): CKTOTAL, CKMB, CKMBINDEX, TROPONINI in the last 168 hours. ?BNP (last 3 results) ?No results for input(s): PROBNP in the last 8760 hours. ?HbA1C: ?No results for input(s): HGBA1C in the last 72 hours. ?CBG: ?No results for input(s): GLUCAP in the last 168 hours. ?Lipid Profile: ?No results for input(s): CHOL, HDL, LDLCALC, TRIG, CHOLHDL, LDLDIRECT in the last 72 hours. ?Thyroid Function Tests: ?No results for input(s): TSH, T4TOTAL, FREET4, T3FREE, THYROIDAB in the last 72 hours. ?Anemia Panel: ?No results for input(s): VITAMINB12, FOLATE, FERRITIN, TIBC,  IRON, RETICCTPCT in the last 72 hours. ?Sepsis Labs: ?No results for input(s): PROCALCITON, LATICACIDVEN in the last 168 hours. ? ?No results found for this or any previous visit (from the past 240 hour(s)).  ? ? ? ? ? ?Radiology Studies: ?CT ABDOMEN PELVIS WO CONTRAST ? ?Result Date: 07/05/2021 ?CLINICAL DATA:  RLQ abdominal pain (Age >= 14y) R lower pain for 11 days, now feeling of fullness and bilateral lower abd pain EXAM: CT ABDOMEN AND PELVIS WITHOUT CONTRAST TECHNIQUE: Multidetector CT imaging of the abdomen and pelvis was performed following the standard protocol without IV contrast. RADIATION DOSE REDUCTION: This exam was performed according to the departmental dose-optimization program which includes  automated exposure control, adjustment of the mA and/or kV according to patient size and/or use of iterative reconstruction technique. COMPARISON:  None. FINDINGS: Lower chest: No acute abnormality. Hepatobiliary: No focal liver abnormality is seen. No gallstones, gallbladder wall thickening, or biliary dilatation. Pancreas: Unremarkable. Spleen: Unremarkable. Adrenals/Urinary Tract: Adrenals, kidneys, and poorly distended bladder are unremarkable. Stomach/Bowel: Stomach is within normal limits. Small bowel is normal in caliber. There is stool throughout much of the colon. Sigmoid colon wall thickening with adjacent fat infiltration and fluid. Normal appendix. Vascular/Lymphatic: Mild atherosclerosis.  No enlarged nodes. Reproductive: Status post hysterectomy. No adnexal masses. Other: No free air.  No acute abnormality of the abdominal wall. 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 IMPRESSION: Sigmoid colon wall thickening with adjacent inflammatory changes. May reflect infectious/inflammatory colitis or diverticulitis (no definite culprit diverticulum identified). Colonoscopy is recommended to exclude neoplastic wall thickening. Electronically Signed   By: Macy Mis M.D.   On: 07/05/2021 10:48   ? ? ? ? ? ?Scheduled Meds: ? amLODipine  10 mg Oral Daily  ? enoxaparin (LOVENOX) injection  0.5 mg/kg Subcutaneous Q24H  ? feeding supplement  237 mL Oral BID BM  ? latanoprost  1 drop Both Eyes QHS  ? losartan  100 mg Oral Daily  ? multivitamin with minerals  1 tablet Oral Daily  ? timolol  1 drop Left Eye BID  ? ?Continuous Infusions: ? sodium chloride    ? piperacillin-tazobactam (ZOSYN)  IV 3.375 g (07/06/21 9450)  ? ? ?Assessment & Plan: ?  ?Principal Problem: ?  Acute diverticulitis ?Active Problems: ?  Obesity ?  Hypertension ?  Hyponatremia ? ? ?Acute sigmoid diverticulitis ?Still with, not much p.o. intake ?We will continue full liquid  diet ?Start IV fluids ?Continue Zosyn ?GI plans to do colonoscopy as outpatient ?Stool GI panel/c.diff pending ? ?Obesity ?Patient has a BMI of 37 ?Complicates overall prognosis and care ?4/15 lifestyle modification and exercise discussed with patient during her hospitalization  ? ? ?  ?Hypertension ?Stable continue home meds ?  ?Hyponatremia ?Most likely secondary to poor oral intake ?4/15 sodium 130 start IV fluids for hydration ? ? ? ? ?DVT prophylaxis: Lovenox ?Code Status: Full ?Family Communication: None at bedside ?Disposition Plan:  ?Status is: Inpatient ?Remains inpatient appropriate because: IV treatment, decreased p.o. intake, still with abdominal pain ?  ? ? ? ? ? LOS: 1 day  ? ?Time spent: 35 minutes ? ? ? ?Nolberto Hanlon, MD ?[facility] ?Pager [phone] ? ?If 7PM-7AM, please contact night-coverage ?07/06/2021, 8:27 AM   ?

## 2021-07-07 DIAGNOSIS — K5792 Diverticulitis of intestine, part unspecified, without perforation or abscess without bleeding: Secondary | ICD-10-CM | POA: Diagnosis not present

## 2021-07-07 LAB — GASTROINTESTINAL PANEL BY PCR, STOOL (REPLACES STOOL CULTURE)

## 2021-07-07 LAB — CBC
HCT: 31.1 % — ABNORMAL LOW (ref 36.0–46.0)
Hemoglobin: 10.6 g/dL — ABNORMAL LOW (ref 12.0–15.0)
MCH: 31.2 pg (ref 26.0–34.0)
MCHC: 34.1 g/dL (ref 30.0–36.0)
MCV: 91.5 fL (ref 80.0–100.0)
Platelets: 218 10*3/uL (ref 150–400)
RBC: 3.4 MIL/uL — ABNORMAL LOW (ref 3.87–5.11)
RDW: 12.2 % (ref 11.5–15.5)
WBC: 15.9 10*3/uL — ABNORMAL HIGH (ref 4.0–10.5)
nRBC: 0 % (ref 0.0–0.2)

## 2021-07-07 LAB — C DIFFICILE QUICK SCREEN W PCR REFLEX
C Diff antigen: NEGATIVE
C Diff interpretation: NOT DETECTED
C Diff toxin: NEGATIVE

## 2021-07-07 LAB — BASIC METABOLIC PANEL
Anion gap: 8 (ref 5–15)
BUN: 15 mg/dL (ref 8–23)
CO2: 22 mmol/L (ref 22–32)
Calcium: 7.9 mg/dL — ABNORMAL LOW (ref 8.9–10.3)
Chloride: 100 mmol/L (ref 98–111)
Creatinine, Ser: 1.02 mg/dL — ABNORMAL HIGH (ref 0.44–1.00)
GFR, Estimated: 58 mL/min — ABNORMAL LOW (ref >60)
Glucose, Bld: 128 mg/dL — ABNORMAL HIGH (ref 70–99)
Potassium: 3.8 mmol/L (ref 3.5–5.1)
Sodium: 130 mmol/L — ABNORMAL LOW (ref 135–145)

## 2021-07-07 MED ORDER — AMOXICILLIN-POT CLAVULANATE 875-125 MG PO TABS: 1.0000 | ORAL_TABLET | Freq: Three times a day (TID) | ORAL | 0 refills | Status: AC

## 2021-07-07 MED ORDER — RISAQUAD PO CAPS
1.0000 | ORAL_CAPSULE | Freq: Every day | ORAL | Status: DC
  Administered 2021-07-07: 1 via ORAL
  Filled 2021-07-07: qty 1

## 2021-07-07 NOTE — Progress Notes (Signed)
Patient medically cleared to discharge with orders placed by MD.  Patient received AVS including medication regarding changes to medications and follow up appointments with all questions answered by Primary RN.  PIV removed prior to discharge with patient awaiting arrival of spouse for transport to home.  ?

## 2021-07-07 NOTE — Discharge Summary (Signed)
Savannah Hood BHA:[id number] DOB: November 16, 1945 DOA: 07/05/2021 ? ?PCP: Crecencio Mc, MD ? ?Admit date: 07/05/2021 ?Discharge date: 07/07/2021 ? ?Admitted From: Home ?Disposition: Home ? ?Recommendations for Outpatient Follow-up:  ?Follow up with PCP in 1 week ?Please obtain BMP/CBC in one week ?Please follow up with GI  ? ?  ? ? ?Discharge Condition:Stable ?CODE STATUS:full  ?Diet recommendation: high fiber ? ? ?Brief/Interim Summary: ?Per HPI: Savannah Hood is a 76 y.o. female with medical history significant for obesity with BMI of 37.79 kg/m2, hypertension and dyslipidemia who presents to the ER for evaluation of abdominal pain mostly in the left lower quadrant with radiation to the suprapubic area for over 10 days.  She rates her pain a 7 x 10 in intensity at its worst associated with loose stools containing mucus.  Over the last 2 days she has noted suprapubic pain as well as burning sensation when she voids.  She complains of a low-grade fever with a Tmax at home of 100.1. ?Oral intake has been poor but she denies having any nausea or vomiting. ?She was prescribed Zofran and azithromycin by her primary care provider for possible acute infectious diarrhea without any improvement in her symptoms. ?She denies having any chest pain, no shortness of breath, no headache, no blurred vision, no focal deficits, no leg swelling, no dizziness or lightheadedness. ? ?CT was obtained found with :Sigmoid colon wall thickening with adjacent inflammatory changes. ?May reflect infectious/inflammatory colitis or diverticulitis (no ?definite culprit diverticulum identified). Colonoscopy is ?recommended to exclude neoplastic wall thickening. ? ?Patient was admitted to the hospital for IV antibiotics for treatment of her diverticulitis.  Today she tolerated her p.o. intake.  Afebrile.  No diarrhea or abdominal pain.  She is stable for discharge.  She was instructed follow-up with GI As outpatient. ? ?Acute sigmoid  diverticulitis ?Started on ivf ?Advance diet as tolerated which she did today. ?Was started on IV antibiotics will transition to p.o. to complete the course ?GI plans to do colonoscopy as outpatient ?Stool GI panel and C. difficile were negative ? ? ?Obesity ?Patient has a BMI of 37 ?Complicates overall prognosis and care ?lifestyle modification and exercise discussed with patient during her hospitalization  ?  ?  ?  ?Hypertension ?Stable continue home meds ?  ?Hyponatremia ?Most likely secondary to poor oral intake ?Improved with IV fluid ? ? ? ? ?Discharge Diagnoses:  ?Principal Problem: ?  Acute diverticulitis ?Active Problems: ?  Obesity ?  Hypertension ?  Hyponatremia ? ? ? ?Discharge Instructions ? ?Discharge Instructions   ? ? Call MD for:  persistant nausea and vomiting   Complete by: As directed ?  ? Call MD for:  severe uncontrolled pain   Complete by: As directed ?  ? Diet - low sodium heart healthy   Complete by: As directed ?  ? Follow a high fiber diet.  ? Discharge instructions   Complete by: As directed ?  ? F/u with pcp next week ?F/u with GI doctor for colonoscopy  ? Increase activity slowly   Complete by: As directed ?  ? ?  ? ?Allergies as of 07/07/2021   ? ?   Reactions  ? Iodinated Contrast Media Other (See Comments)  ? Burning sensation in her veins, and nausea  ? ?  ? ?  ?Medication List  ?  ? ?STOP taking these medications   ? ?azithromycin 500 MG tablet ?Commonly known as: ZITHROMAX ?  ?LUTEIN PO ?  ?ondansetron 4 MG  tablet ?Commonly known as: Zofran ?  ?predniSONE 10 MG tablet ?Commonly known as: DELTASONE ?  ?tiZANidine 4 MG tablet ?Commonly known as: Zanaflex ?  ? ?  ? ?TAKE these medications   ? ?amLODipine 10 MG tablet ?Commonly known as: NORVASC ?TAKE ONE TABLET BY MOUTH EVERY DAY ?  ?amoxicillin-clavulanate 875-125 MG tablet ?Commonly known as: Augmentin ?Take 1 tablet by mouth 3 (three) times daily for 9 days. ?  ?Aspercreme 10 % Lotn ?Generic drug: Trolamine Salicylate ?   ?Clotrimazole 1 % Oint ?Apply 1 application. topically in the morning and at bedtime. ?  ?COD LIVER OIL PO ?Take 1 capsule by mouth daily. ?  ?latanoprost 0.005 % ophthalmic solution ?Commonly known as: XALATAN ?Place 1 drop into both eyes daily. ?  ?losartan 100 MG tablet ?Commonly known as: COZAAR ?TAKE 1 TABLET BY MOUTH DAILY ?  ?mometasone 0.1 % lotion ?Commonly known as: ELOCON ?PLACE 2 TO 4 DROPS INTO RIGHT EAR EACH WEEK ?  ?PRESERVISION AREDS 2 PO ?Take 1 tablet by mouth 2 (two) times daily. ?  ?timolol 0.5 % ophthalmic solution ?Commonly known as: TIMOPTIC ?INSTILL 1 DROP INTO LEFT EYE EVERY DAY ?  ?Turmeric Curcumin Caps ?Take 1 capsule by mouth daily. ?  ?vitamin B-12 1000 MCG tablet ?Commonly known as: CYANOCOBALAMIN ?Take 1,000 mcg by mouth daily. ?  ?Vitamin D3 25 MCG (1000 UT) Caps ?Take 2 capsules by mouth daily. ?  ? ?  ? ? ?Allergies  ?Allergen Reactions  ? Iodinated Contrast Media Other (See Comments)  ?  Burning sensation in her veins, and nausea  ? ? ?Consultations: ? ? ? ?Procedures/Studies: ?CT ABDOMEN PELVIS WO CONTRAST ? ?Result Date: 07/05/2021 ?CLINICAL DATA:  RLQ abdominal pain (Age >= 14y) R lower pain for 11 days, now feeling of fullness and bilateral lower abd pain EXAM: CT ABDOMEN AND PELVIS WITHOUT CONTRAST TECHNIQUE: Multidetector CT imaging of the abdomen and pelvis was performed following the standard protocol without IV contrast. RADIATION DOSE REDUCTION: This exam was performed according to the departmental dose-optimization program which includes automated exposure control, adjustment of the mA and/or kV according to patient size and/or use of iterative reconstruction technique. COMPARISON:  None. FINDINGS: Lower chest: No acute abnormality. Hepatobiliary: No focal liver abnormality is seen. No gallstones, gallbladder wall thickening, or biliary dilatation. Pancreas: Unremarkable. Spleen: Unremarkable. Adrenals/Urinary Tract: Adrenals, kidneys, and poorly distended bladder are  unremarkable. Stomach/Bowel: Stomach is within normal limits. Small bowel is normal in caliber. There is stool throughout much of the colon. Sigmoid colon wall thickening with adjacent fat infiltration and fluid. Normal appendix. Vascular/Lymphatic: Mild atherosclerosis.  No enlarged nodes. Reproductive: Status post hysterectomy. No adnexal masses. Other: No free air.  No acute abnormality of the abdominal wall. 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 IMPRESSION: Sigmoid colon wall thickening with adjacent inflammatory changes. May reflect infectious/inflammatory colitis or diverticulitis (no definite culprit diverticulum identified). Colonoscopy is recommended to exclude neoplastic wall thickening. Electronically Signed   By: Macy Mis M.D.   On: 07/05/2021 10:48   ? ? ? ?Subjective: ?Tolerated feeding.  No abdominal pain, diarrhea, nausea vomit ? ?Discharge Exam: ?Vitals:  ? 07/07/21 0531 07/07/21 0810  ?BP: (!) 126/57 (!) 120/56  ?Pulse: 80 76  ?Resp: 18 16  ?Temp: 98.9 ?F (37.2 ?C) 97.9 ?F (36.6 ?C)  ?SpO2: 99% 99%  ? ?Vitals:  ? 07/06/21 1518 07/06/21 2200 07/07/21 0531 07/07/21 0810  ?BP: (!) 112/50 (!) 130/53 (!) 126/57 (!) 120/56  ?  Pulse: 77 75 80 76  ?Resp: '16 18 18 16  '$ ?Temp: 98.8 ?F (37.1 ?C) 98.4 ?F (36.9 ?C) 98.9 ?F (37.2 ?C) 97.9 ?F (36.6 ?C)  ?TempSrc: Oral Oral Oral   ?SpO2: 94% 96% 99% 99%  ?Weight:      ?Height:      ? ? ?General: Pt is alert, awake, not in acute distress ?Cardiovascular: RRR, S1/S2 +, no rubs, no gallops ?Respiratory: CTA bilaterally, no wheezing, no rhonchi ?Abdominal: Soft, NT, ND, bowel sounds + ?Extremities: no edema, no cyanosis ? ? ? ?The results of significant diagnostics from this hospitalization (including imaging, microbiology, ancillary and laboratory) are listed below for reference.   ? ? ?Microbiology: ?Recent Results (from the past 240 hour(s))  ?Urine Culture     Status: Abnormal  ? Collection  Time: 07/05/21 10:30 AM  ? Specimen: Urine, Clean Catch  ?Result Value Ref Range Status  ? Specimen Description   Final  ?  URINE, CLEAN CATCH ?Performed at [facility], [address], [place], [place] [address]

## 2021-07-07 NOTE — TOC CM/SW Note (Signed)
?  Transition of Care (TOC) Screening Note ? ? ?Patient Details  ?Name: Savannah Hood ?Date of Birth: [birth year] ? ? ?Transition of Care (TOC) CM/SW Contact:    ?Louvenia Golomb E [place] Dost, LCSW ?Phone Number: ?07/07/2021, 8:59 AM ? ? ? ?Transition of Care Department [facility]) has reviewed patient and no TOC needs have been identified at this time. We will continue to monitor patient advancement through interdisciplinary progression rounds. If new patient transition needs arise, please place a TOC consult. ? ? ?

## 2021-07-08 ENCOUNTER — Telehealth: Payer: Self-pay

## 2021-07-08 NOTE — Telephone Encounter (Signed)
Transition Care Management Follow-up Telephone Call ?Date of discharge and from where: 07/07/21 [facility]. ?How have you been since you were released from the hospital? "I did not rest well last night, tired and weak.  My appetite still has not picked up but I did eat a whole wheat English Muffin last night and one today. I plan to have a vegetable stir fry in light olive oil for dinner. My stomach is swollen although I am not eating much and stool discharge is watery, yellowish mucus. Abdominal cramping only when I am sitting to have a BM. ABD pain is at baseline and controlled. Taking tylenol every once in awhile.  Second day antibiotics taken." Denies fever, N/V, uncontrolled pain and all other harmful symptoms.  ?Any questions or concerns? No ? ?Items Reviewed: ?Did the pt receive and understand the discharge instructions provided? Yes  ?Medications obtained and verified? Yes  ?Any new allergies since your discharge? No  ?Dietary orders reviewed? Yes, high fiber ?Do you have support at home? Yes  ? ?Home Care and Equipment/Supplies: ?Were home health services ordered? No ?Functional Questionnaire: (I = Independent and D = Dependent) ?ADLs: I ? ?Bathing/Dressing- I ? ?Meal Prep- husband assist ? ?Eating- I ? ?Maintaining continence- I ? ?Transferring/Ambulation- I ? ?Managing Meds- I ? ?Follow up appointments reviewed: ? ?PCP Hospital f/u appt confirmed? Yes  Scheduled to see PCP on 07/10/21 @ 11:00. BMP/CBC due.  ?[facility] f/u appt confirmed? Yes  Scheduled to see GI on 07/23/21.  ?Are transportation arrangements needed? No  ?If their condition worsens, is the pt aware to call PCP or go to the Emergency Dept.? Yes ?Was the patient provided with contact information for the PCP's office or ED? Yes ?Was to pt encouraged to call back with questions or concerns? Yes  ?

## 2021-07-09 ENCOUNTER — Telehealth: Payer: Self-pay | Admitting: Internal Medicine

## 2021-07-09 ENCOUNTER — Other Ambulatory Visit: Payer: Self-pay | Admitting: Internal Medicine

## 2021-07-09 DIAGNOSIS — K521 Toxic gastroenteritis and colitis: Secondary | ICD-10-CM

## 2021-07-09 NOTE — Telephone Encounter (Signed)
Spoke with pt and she stated that she rescheduled her hospital follow up because she has started having "explosive diarrhea". Pt has been in the hospital for diverticulitis and been treated with 3 days of oral antibiotics, IV antibiotics, and now again on a 9 day course of antibiotics. Pt was wanting to know what she could take to stop the diarrhea and she was advised that she should not take anything to stop it because it could be c. Diff. Pt was tested for c. Diff on 06/05/2021 at the hospital but that was before the watery diarrhea started. Pt is scheduled for a telephone visit tomorrow.  ?

## 2021-07-09 NOTE — Telephone Encounter (Signed)
Pt would like a call from the provider ?

## 2021-07-10 ENCOUNTER — Inpatient Hospital Stay: Payer: Medicare HMO | Admitting: Internal Medicine

## 2021-07-10 ENCOUNTER — Other Ambulatory Visit
Admission: RE | Admit: 2021-07-10 | Discharge: 2021-07-10 | Disposition: A | Discharge status: Home / Self Care | Payer: Medicare HMO | Source: Ambulatory Visit | Attending: Internal Medicine | Admitting: Internal Medicine

## 2021-07-10 ENCOUNTER — Ambulatory Visit (INDEPENDENT_AMBULATORY_CARE_PROVIDER_SITE_OTHER): Payer: Medicare HMO | Admitting: Internal Medicine

## 2021-07-10 ENCOUNTER — Encounter: Payer: Self-pay | Admitting: Internal Medicine

## 2021-07-10 VITALS — Ht 66.0 in | Wt 234.0 lb

## 2021-07-10 DIAGNOSIS — K5792 Diverticulitis of intestine, part unspecified, without perforation or abscess without bleeding: Secondary | ICD-10-CM

## 2021-07-10 DIAGNOSIS — D72825 Bandemia: Secondary | ICD-10-CM

## 2021-07-10 DIAGNOSIS — K521 Toxic gastroenteritis and colitis: Secondary | ICD-10-CM | POA: Diagnosis not present

## 2021-07-10 DIAGNOSIS — E871 Hypo-osmolality and hyponatremia: Secondary | ICD-10-CM

## 2021-07-10 DIAGNOSIS — T3695XA Adverse effect of unspecified systemic antibiotic, initial encounter: Secondary | ICD-10-CM

## 2021-07-10 LAB — C DIFFICILE QUICK SCREEN W PCR REFLEX
C Diff antigen: NEGATIVE
C Diff interpretation: NOT DETECTED
C Diff toxin: NEGATIVE

## 2021-07-10 NOTE — Progress Notes (Addendum)
Telephone  Note ? ?This visit type was conducted due to national recommendations for restrictions regarding the COVID-19 pandemic (e.g. social distancing).  This format is felt to be most appropriate for this patient at this time.  All issues noted in this document were discussed and addressed.  No physical exam was performed (except for noted visual exam findings with Video Visits).  ? ?I connected withNAME@ on 07/10/21 at 11:00 AM EDT by  telephone and verified that I am speaking with the correct person using two identifiers. ?Location patient: home ?Location provider: work or home office ?Persons participating in the virtual visit: patient, provider ? ?I discussed the limitations, risks, security and privacy concerns of performing an evaluation and management service by telephone and the availability of in person appointments. I also discussed with the patient that there may be a patient responsible charge related to this service. The patient expressed understanding and agreed to proceed. ? ?Interactive audio and video telecommunications were attempted between this provider and patient, however failed, due to patient having technical difficulties OR patient did not have access to video capability.  We continued and completed visit with audio only. ? ?Reason for visit: diarrhea  ? ?HPI: ? ?76 yr old female recently admitted for treatment of diverticulitis  which started on April 3 with  onset of change: Presented with mucus and clear fecal discharge for several days before admission acc'd by moderate pain and pressure. Developed fecal material in discharge on evening of April 15,  then at midnight stared having explosive stools all night long until 7 am .  April 16 .  Diet was advanced  ? ?Following discharge on April 16, stools were initially infrequent but by evening of april 16 became explosive, then transitioned to liquid stools , copious amounts from april 17 to April 18 evening  with abdominal and rectal  pain .  Tapered off last night,  last explosive stool was at midnight ,  has gone 4 hours today without stooling .  No fevers since discharge. Started probiotics yesterday. ? ?Appetite finally returned .  No nausea. Has been eating fiber bars, applesauce  ?  ? ?ROS: See pertinent positives and negatives per HPI. ? ?Past Medical History:  ?Diagnosis Date  ? Arthritis   ? Colon polyps   ? Diabetes mellitus   ? pre-diabetes in past  ? Glaucoma   ? Hyperlipidemia   ? Hypertension   ? Obesity   ? ? ?Past Surgical History:  ?Procedure Laterality Date  ? ABDOMINAL HYSTERECTOMY    ? total, secondary to uterine polyps, Dr. Rayford Halsted  ? ? ?Family History  ?Problem Relation Age of Onset  ? Arthritis Mother   ? Arthritis Sister   ? Cancer Father   ?     prostate  ? Diabetes Father   ? Diabetes Paternal Grandmother   ? Breast cancer Neg Hx   ? ? ?SOCIAL HX:  reports that she has never smoked. She has never used smokeless tobacco. She reports current alcohol use. She reports that she does not use drugs.  ? ? ?Current Outpatient Medications:  ?  amLODipine (NORVASC) 10 MG tablet, TAKE ONE TABLET BY MOUTH EVERY DAY (Patient taking differently: Take 10 mg by mouth daily.), Disp: 90 tablet, Rfl: 3 ?  amoxicillin-clavulanate (AUGMENTIN) 875-125 MG tablet, Take 1 tablet by mouth 3 (three) times daily for 9 days., Disp: 27 tablet, Rfl: 0 ?  Cholecalciferol (VITAMIN D3) 1000 units CAPS, Take 2 capsules by mouth daily. ,  Disp: , Rfl:  ?  Clotrimazole 1 % OINT, Apply 1 application. topically in the morning and at bedtime., Disp: , Rfl:  ?  COD LIVER OIL PO, Take 1 capsule by mouth daily. , Disp: , Rfl:  ?  latanoprost (XALATAN) 0.005 % ophthalmic solution, Place 1 drop into both eyes daily., Disp: , Rfl:  ?  losartan (COZAAR) 100 MG tablet, TAKE 1 TABLET BY MOUTH DAILY (Patient taking differently: Take 100 mg by mouth daily.), Disp: 90 tablet, Rfl: 1 ?  Misc Natural Products (TURMERIC CURCUMIN) CAPS, Take 1 capsule by mouth daily., Disp: ,  Rfl:  ?  mometasone (ELOCON) 0.1 % lotion, PLACE 2 TO 4 DROPS INTO RIGHT EAR EACH WEEK, Disp: , Rfl:  ?  Multiple Vitamins-Minerals (PRESERVISION AREDS 2 PO), Take 1 tablet by mouth 2 (two) times daily., Disp: , Rfl:  ?  timolol (TIMOPTIC) 0.5 % ophthalmic solution, INSTILL 1 DROP INTO LEFT EYE EVERY DAY, Disp: , Rfl: 6 ?  Trolamine Salicylate (ASPERCREME) 10 % LOTN, , Disp: , Rfl:  ?  vitamin B-12 (CYANOCOBALAMIN) 1000 MCG tablet, Take 1,000 mcg by mouth daily., Disp: , Rfl:  ? ?EXAM: ? ?VITALS per patient if applicable: ? ?GENERAL: alert, oriented, appears well and in no acute distress ? ?HEENT: atraumatic, conjunttiva clear, no obvious abnormalities on inspection of external nose and ears ? ?NECK: normal movements of the head and neck ? ?LUNGS: on inspection no signs of respiratory distress, breathing rate appears normal, no obvious gross SOB, gasping or wheezing ? ?CV: no obvious cyanosis ? ?MS: moves all visible extremities without noticeable abnormality ? ?PSYCH/NEURO: pleasant and cooperative, no obvious depression or anxiety, speech and thought processing grossly intact ? ?ASSESSMENT AND PLAN: ? ?Discussed the following assessment and plan: ? ?Hyponatremia - Plan: Basic metabolic panel ? ?Bandemia - Plan: CBC with Differential/Platelet ? ?Antibiotic-associated diarrhea ? ?Diverticulitis ? ?Antibiotic-associated diarrhea ?c dif pcr was negative during admission;  But stools have become watery again and repeat screen is necessary .    ? ?Diverticulitis ?Reviewed dietary management and recommended bland diet  ? ?  ?I discussed the assessment and treatment plan with the patient. The patient was provided an opportunity to ask questions and all were answered. The patient agreed with the plan and demonstrated an understanding of the instructions. ?  ?The patient was advised to call back or seek an in-person evaluation if the symptoms worsen or if the condition fails to improve as anticipated. ? ?A total of 20  minutes of face to face time was spent in a virtual visit with patient in reviewing her symptoms,  her recent current bowel pattern,  current antibiotic use,   and post visit therapeutics and diagnostics   ? ?Crecencio Mc, MD   ?

## 2021-07-10 NOTE — Telephone Encounter (Signed)
noted 

## 2021-07-11 DIAGNOSIS — K521 Toxic gastroenteritis and colitis: Secondary | ICD-10-CM | POA: Insufficient documentation

## 2021-07-11 NOTE — Assessment & Plan Note (Signed)
c dif pcr was negative during admission;  But stools have become watery again and repeat screen is necessary .    ?

## 2021-07-11 NOTE — Assessment & Plan Note (Signed)
Reviewed dietary management and recommended bland diet  ?

## 2021-07-19 ENCOUNTER — Encounter: Payer: Self-pay | Admitting: Internal Medicine

## 2021-07-19 ENCOUNTER — Ambulatory Visit (INDEPENDENT_AMBULATORY_CARE_PROVIDER_SITE_OTHER): Payer: Medicare HMO | Admitting: Internal Medicine

## 2021-07-19 VITALS — BP 132/70 | HR 77 | Temp 97.5°F | Ht 66.0 in | Wt 228.0 lb

## 2021-07-19 DIAGNOSIS — Z09 Encounter for follow-up examination after completed treatment for conditions other than malignant neoplasm: Secondary | ICD-10-CM | POA: Diagnosis not present

## 2021-07-19 DIAGNOSIS — K5792 Diverticulitis of intestine, part unspecified, without perforation or abscess without bleeding: Secondary | ICD-10-CM | POA: Diagnosis not present

## 2021-07-19 DIAGNOSIS — E871 Hypo-osmolality and hyponatremia: Secondary | ICD-10-CM

## 2021-07-19 DIAGNOSIS — D649 Anemia, unspecified: Secondary | ICD-10-CM

## 2021-07-19 LAB — BASIC METABOLIC PANEL
BUN: 20 mg/dL (ref 6–23)
CO2: 27 mEq/L (ref 19–32)
Calcium: 9 mg/dL (ref 8.4–10.5)
Chloride: 105 mEq/L (ref 96–112)
Creatinine, Ser: 0.92 mg/dL (ref 0.40–1.20)
GFR: 61.56 mL/min (ref >60.00)
Glucose, Bld: 112 mg/dL — ABNORMAL HIGH (ref 70–99)
Potassium: 4.6 mEq/L (ref 3.5–5.1)
Sodium: 137 mEq/L (ref 135–145)

## 2021-07-19 LAB — CBC WITH DIFFERENTIAL/PLATELET
Basophils Absolute: 0.1 10*3/uL (ref 0.0–0.1)
Basophils Relative: 1.1 % (ref 0.0–3.0)
Eosinophils Absolute: 0.1 10*3/uL (ref 0.0–0.7)
Eosinophils Relative: 1.2 % (ref 0.0–5.0)
HCT: 37.3 % (ref 36.0–46.0)
Hemoglobin: 12.5 g/dL (ref 12.0–15.0)
Lymphocytes Relative: 27.2 % (ref 12.0–46.0)
Lymphs Abs: 1.5 10*3/uL (ref 0.7–4.0)
MCHC: 33.5 g/dL (ref 30.0–36.0)
MCV: 93.3 fl (ref 78.0–100.0)
Monocytes Absolute: 0.4 10*3/uL (ref 0.1–1.0)
Monocytes Relative: 7.2 % (ref 3.0–12.0)
Neutro Abs: 3.4 10*3/uL (ref 1.4–7.7)
Neutrophils Relative %: 63.3 % (ref 43.0–77.0)
Platelets: 264 10*3/uL (ref 150.0–400.0)
RBC: 4 Mil/uL (ref 3.87–5.11)
RDW: 12.9 % (ref 11.5–15.5)
WBC: 5.4 10*3/uL (ref 4.0–10.5)

## 2021-07-19 LAB — IBC + FERRITIN
Ferritin: 159.4 ng/mL (ref 10.0–291.0)
Iron: 76 ug/dL (ref 42–145)
Saturation Ratios: 28.1 % (ref 20.0–50.0)
TIBC: 270.2 ug/dL (ref 250.0–450.0)
Transferrin: 193 mg/dL — ABNORMAL LOW (ref 212.0–360.0)

## 2021-07-19 LAB — SEDIMENTATION RATE: Sed Rate: 41 mm/hr — ABNORMAL HIGH (ref 0–30)

## 2021-07-19 LAB — C-REACTIVE PROTEIN: CRP: <1 mg/dL (ref 0.5–20.0)

## 2021-07-19 NOTE — Progress Notes (Signed)
? ?Subjective:  ?Patient ID: Savannah Hood, female    DOB: 1945/05/08  Age: 76 y.o. MRN: [id number] ? ?CC: The primary encounter diagnosis was Anemia, unspecified type. Diagnoses of Hyponatremia, Diverticulitis, and Hospital discharge follow-up were also pertinent to this visit. ? ? ?This visit occurred during the SARS-CoV-2 public health emergency.  Safety protocols were in place, including screening questions prior to the visit, additional usage of staff PPE, and extensive cleaning of exam room while observing appropriate contact time as indicated for disinfecting solutions.   ? ?HPI ? ? ?Savannah Hood presents for follow up on recent hospitalization for diverticulitis  ?Marland Kitchen   ?She was admitted to [facility] on April 14 with 10 day history of persistent LLQ pain radiating to umbilicus.  .  Sigmoid Diverticulitis seen on CT . Marland Kitchen  Treated with iv abx and transitioned to augmentin tid at discharge.  She was evaluated in the interim for c dificile colitis due to the development of watery stools after discharge,  and the test was negative.  Her bowel movements have started to improve in consistency.   ? ?CT results: ?IMPRESSION:  ?Sigmoid colon wall thickening with adjacent inflammatory changes. May reflect infectious/inflammatory colitis or diverticulitis (no  ?definite culprit diverticulum identified). Colonoscopy is recommended to exclude neoplastic wall thickening.  ? ?Was rechecked for C dif after discharge due to recurrence of watery diarrhea   negative.   Currently stools are soft and small.  Eating bland diet of chicken,  cooked veggies ,  canned fruits. No   nausea.  Eating fiber one bars .  Some LLQ pain relieved with stooling.  ? ?Energy level has been  low , nighttime stools have stopped since abx was finished.  Sees GI Tuesday  ? ?Outpatient Medications Prior to Visit  ?Medication Sig Dispense Refill  ? amLODipine (NORVASC) 10 MG tablet TAKE ONE TABLET BY MOUTH EVERY DAY (Patient taking differently:  Take 10 mg by mouth daily.) 90 tablet 3  ? Cholecalciferol (VITAMIN D3) 1000 units CAPS Take 2 capsules by mouth daily.     ? Clotrimazole 1 % OINT Apply 1 application. topically in the morning and at bedtime.    ? COD LIVER OIL PO Take 1 capsule by mouth daily.     ? latanoprost (XALATAN) 0.005 % ophthalmic solution Place 1 drop into both eyes daily.    ? losartan (COZAAR) 100 MG tablet TAKE 1 TABLET BY MOUTH DAILY (Patient taking differently: Take 100 mg by mouth daily.) 90 tablet 1  ? Misc Natural Products (TURMERIC CURCUMIN) CAPS Take 1 capsule by mouth daily.    ? mometasone (ELOCON) 0.1 % lotion PLACE 2 TO 4 DROPS INTO RIGHT EAR EACH WEEK    ? Multiple Vitamins-Minerals (PRESERVISION AREDS 2 PO) Take 1 tablet by mouth 2 (two) times daily.    ? timolol (TIMOPTIC) 0.5 % ophthalmic solution INSTILL 1 DROP INTO LEFT EYE EVERY DAY  6  ? Trolamine Salicylate (ASPERCREME) 10 % LOTN     ? vitamin B-12 (CYANOCOBALAMIN) 1000 MCG tablet Take 1,000 mcg by mouth daily.    ? ?No facility-administered medications prior to visit.  ? ? ?Review of Systems; ? ?Patient denies headache, fevers, malaise, unintentional weight loss, skin rash, eye pain, sinus congestion and sinus pain, sore throat, dysphagia,  hemoptysis , cough, dyspnea, wheezing, chest pain, palpitations, orthopnea, edema, abdominal pain, nausea, melena, diarrhea, constipation, flank pain, dysuria, hematuria, urinary  Frequency, nocturia, numbness, tingling, seizures,  Focal weakness, Loss of consciousness,  Tremor, insomnia, depression, anxiety, and suicidal ideation.   ? ? ? ?Objective:  ?BP 132/70 (BP Location: Left Arm, Patient Position: Sitting, Cuff Size: Large)   Pulse 77   Temp (!) 97.5 ?F (36.4 ?C) (Oral)   Ht '5\' 6"'$  (1.676 m)   Wt 228 lb (103.4 kg)   SpO2 96%   BMI 36.80 kg/m?  ? ?BP Readings from Last 3 Encounters:  ?07/19/21 132/70  ?07/07/21 (!) 120/56  ?06/12/21 (!) 142/78  ? ? ?Wt Readings from Last 3 Encounters:  ?07/19/21 228 lb (103.4 kg)   ?07/10/21 234 lb (106.1 kg)  ?07/05/21 234 lb 2.1 oz (106.2 kg)  ? ? ?General appearance: alert, cooperative and appears stated age ?Ears: normal TM's and external ear canals both ears ?Throat: lips, mucosa, and tongue normal; teeth and gums normal ?Neck: no adenopathy, no carotid bruit, supple, symmetrical, trachea midline and thyroid not enlarged, symmetric, no tenderness/mass/nodules ?Back: symmetric, no curvature. ROM normal. No CVA tenderness. ?Lungs: clear to auscultation bilaterally ?Heart: regular rate and rhythm, S1, S2 normal, no murmur, click, rub or gallop ?Abdomen: soft, non-tender; bowel sounds normal; no masses,  no organomegaly ?Pulses: 2+ and symmetric ?Skin: Skin color, texture, turgor normal. No rashes or lesions ?Lymph nodes: Cervical, supraclavicular, and axillary nodes normal. ? ?Lab Results  ?Component Value Date  ? HGBA1C 5.4 06/07/2020  ? HGBA1C 5.4 11/19/2018  ? HGBA1C 5.4 04/10/2017  ? ? ?Lab Results  ?Component Value Date  ? CREATININE 0.92 07/19/2021  ? CREATININE 1.02 (H) 07/07/2021  ? CREATININE 1.05 (H) 07/06/2021  ? ? ?Lab Results  ?Component Value Date  ? WBC 5.4 07/19/2021  ? HGB 12.5 07/19/2021  ? HCT 37.3 07/19/2021  ? PLT 264.0 07/19/2021  ? GLUCOSE 112 (H) 07/19/2021  ? CHOL 219 (H) 06/12/2021  ? TRIG 155.0 (H) 06/12/2021  ? HDL 50.40 06/12/2021  ? LDLDIRECT 141.0 06/12/2021  ? LDLCALC 138 (H) 06/12/2021  ? ALT 59 (H) 07/05/2021  ? AST 39 07/05/2021  ? NA 137 07/19/2021  ? K 4.6 07/19/2021  ? CL 105 07/19/2021  ? CREATININE 0.92 07/19/2021  ? BUN 20 07/19/2021  ? CO2 27 07/19/2021  ? TSH 2.20 06/12/2021  ? HGBA1C 5.4 06/07/2020  ? MICROALBUR 2.2 (H) 06/12/2021  ? ? ?No results found. ? ?Assessment & Plan:  ? ?Problem List Items Addressed This Visit   ? ? Hyponatremia  ? Relevant Orders  ? Basic metabolic panel (Completed)  ? Hospital discharge follow-up  ?  Patient is stable post discharge and has no new issues or questions about discharge plans at the visit today for hospital  follow up. All labs , imaging studies and progress notes from admission were reviewed with patient today   ? ?  ?  ? Diverticulitis  ?  Clinically resolving on bland diet ,  Post completion of augmentin therapy.  CRP, CBC are normal  ? ?  ?  ? Relevant Orders  ? Sedimentation rate (Completed)  ? C-reactive protein (Completed)  ? ?Other Visit Diagnoses   ? ? Anemia, unspecified type    -  Primary  ? Relevant Orders  ? CBC with Differential/Platelet (Completed)  ? IBC + Ferritin (Completed)  ? ?  ? ? ?I spent a total of   minutes with this patient in a face to face visit on the date of this encounter reviewing  his most recent with his cardiologist on    ,  his diet and eating habits   his  blood pressure readings ,  last office visit with me in this visit  in      and post visit ordering of testing and therapeutics.   ? ?Follow-up: No follow-ups on file. ? ? ?Crecencio Mc, MD ?

## 2021-07-19 NOTE — Patient Instructions (Signed)
Glad you 're feeling better! ? ?I would continue probiotics for a full 2 weeks post antibiotics ? ? ?We can repeat the CT scan after June 14 if there will be a long delay in your colonoscopy ? ? ?

## 2021-07-21 DIAGNOSIS — Z09 Encounter for follow-up examination after completed treatment for conditions other than malignant neoplasm: Secondary | ICD-10-CM | POA: Insufficient documentation

## 2021-07-21 NOTE — Assessment & Plan Note (Signed)
Patient is stable post discharge and has no new issues or questions about discharge plans at the visit today for hospital follow up. All labs , imaging studies and progress notes from admission were reviewed with patient today   

## 2021-07-21 NOTE — Assessment & Plan Note (Signed)
Clinically resolving on bland diet ,  Post completion of augmentin therapy.  CRP, CBC are normal  ?

## 2021-07-23 DIAGNOSIS — R102 Pelvic and perineal pain: Secondary | ICD-10-CM | POA: Diagnosis not present

## 2021-07-23 DIAGNOSIS — Z86010 Personal history of colonic polyps: Secondary | ICD-10-CM | POA: Diagnosis not present

## 2021-07-23 DIAGNOSIS — Z8371 Family history of colonic polyps: Secondary | ICD-10-CM | POA: Diagnosis not present

## 2021-07-23 DIAGNOSIS — E669 Obesity, unspecified: Secondary | ICD-10-CM | POA: Diagnosis not present

## 2021-07-23 DIAGNOSIS — Z6837 Body mass index (BMI) 37.0-37.9, adult: Secondary | ICD-10-CM | POA: Diagnosis not present

## 2021-07-23 DIAGNOSIS — R1032 Left lower quadrant pain: Secondary | ICD-10-CM | POA: Diagnosis not present

## 2021-07-23 DIAGNOSIS — K5732 Diverticulitis of large intestine without perforation or abscess without bleeding: Secondary | ICD-10-CM | POA: Diagnosis not present

## 2021-08-07 DIAGNOSIS — Z0100 Encounter for examination of eyes and vision without abnormal findings: Secondary | ICD-10-CM | POA: Diagnosis not present

## 2021-08-07 DIAGNOSIS — H401131 Primary open-angle glaucoma, bilateral, mild stage: Secondary | ICD-10-CM | POA: Diagnosis not present

## 2021-09-02 ENCOUNTER — Encounter: Payer: Self-pay | Admitting: Internal Medicine

## 2021-09-05 ENCOUNTER — Other Ambulatory Visit: Payer: Self-pay | Admitting: Internal Medicine

## 2021-09-05 DIAGNOSIS — K5792 Diverticulitis of intestine, part unspecified, without perforation or abscess without bleeding: Secondary | ICD-10-CM

## 2021-09-05 DIAGNOSIS — E871 Hypo-osmolality and hyponatremia: Secondary | ICD-10-CM

## 2021-09-06 ENCOUNTER — Telehealth: Payer: Self-pay | Admitting: Internal Medicine

## 2021-09-06 ENCOUNTER — Encounter: Payer: Self-pay | Admitting: Internal Medicine

## 2021-09-06 DIAGNOSIS — R1032 Left lower quadrant pain: Secondary | ICD-10-CM

## 2021-09-06 DIAGNOSIS — K5792 Diverticulitis of intestine, part unspecified, without perforation or abscess without bleeding: Secondary | ICD-10-CM

## 2021-09-06 NOTE — Telephone Encounter (Signed)
Pt called stating that the provider need to change the order to say without contrast for the ct scan on July 12

## 2021-09-06 NOTE — Telephone Encounter (Signed)
Left pt vm to call ofc . thanks 

## 2021-09-09 NOTE — Telephone Encounter (Signed)
Patient returned office phone call and read message that CT order has been corrected.

## 2021-09-09 NOTE — Telephone Encounter (Signed)
LM to let pt know that CT order has been corrected

## 2021-09-09 NOTE — Addendum Note (Signed)
Addended by: Crecencio Mc on: 09/09/2021 11:56 AM   Modules accepted: Orders

## 2021-09-11 IMAGING — MG DIGITAL SCREENING BILAT W/ TOMO W/ CAD
8 of 14 series · 8 of 40 positions shown · non-contrast
Comparison: Previous exam(s).

CLINICAL DATA: Screening.

EXAM:
DIGITAL SCREENING BILATERAL MAMMOGRAM WITH TOMO AND CAD

[L CC synth-2D (1 of 2)]
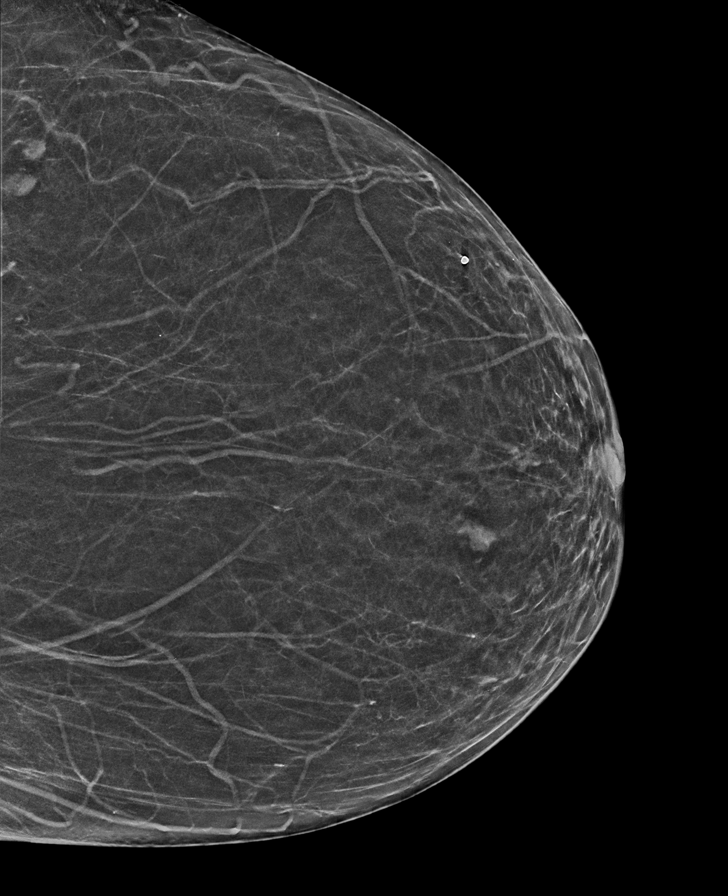

[R CC synth-2D (1 of 2)]
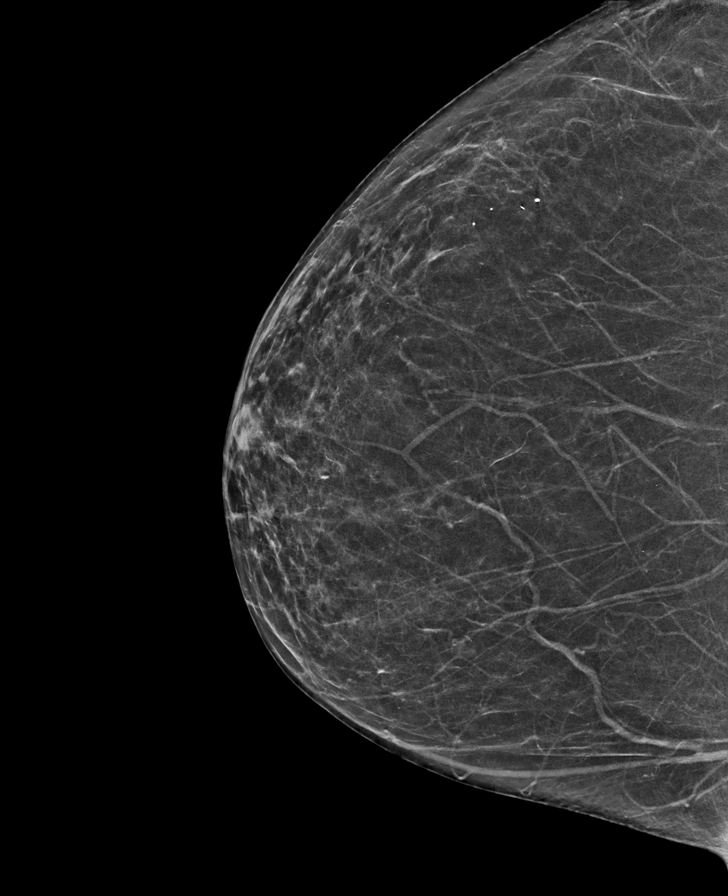

[L MLO synth-2D (1 of 2)]
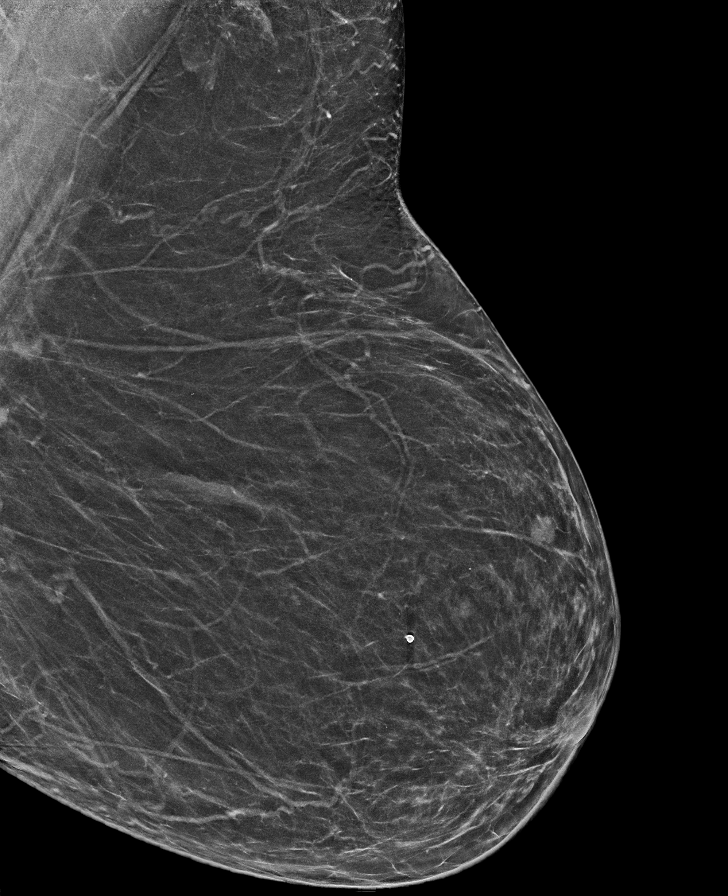

[R CC synth-2D (2 of 2)]
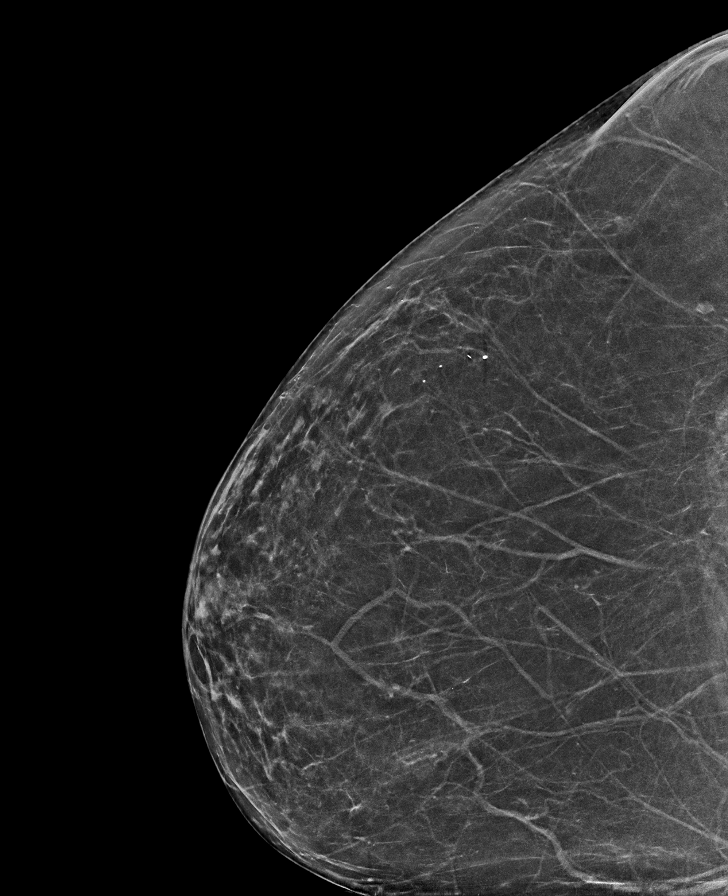

[L CC synth-2D (2 of 2)]
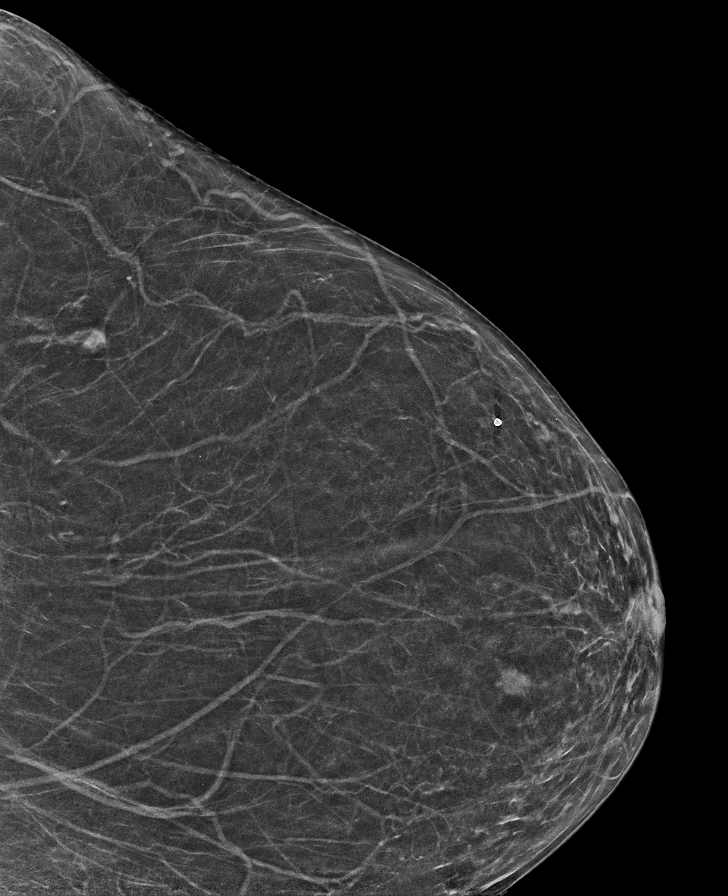

[R MLO synth-2D]
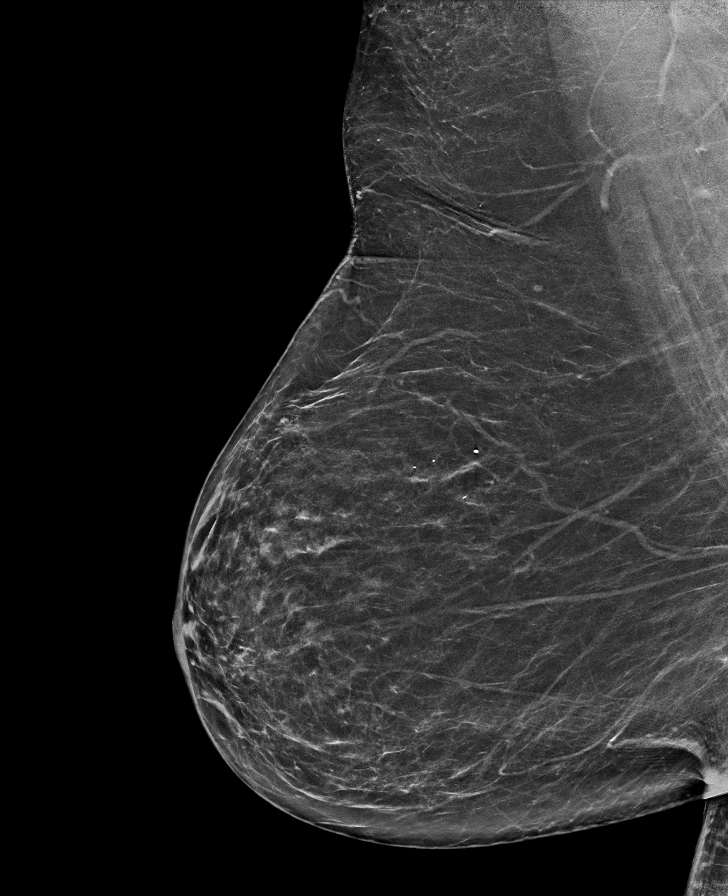

[L MLO synth-2D (2 of 2)]
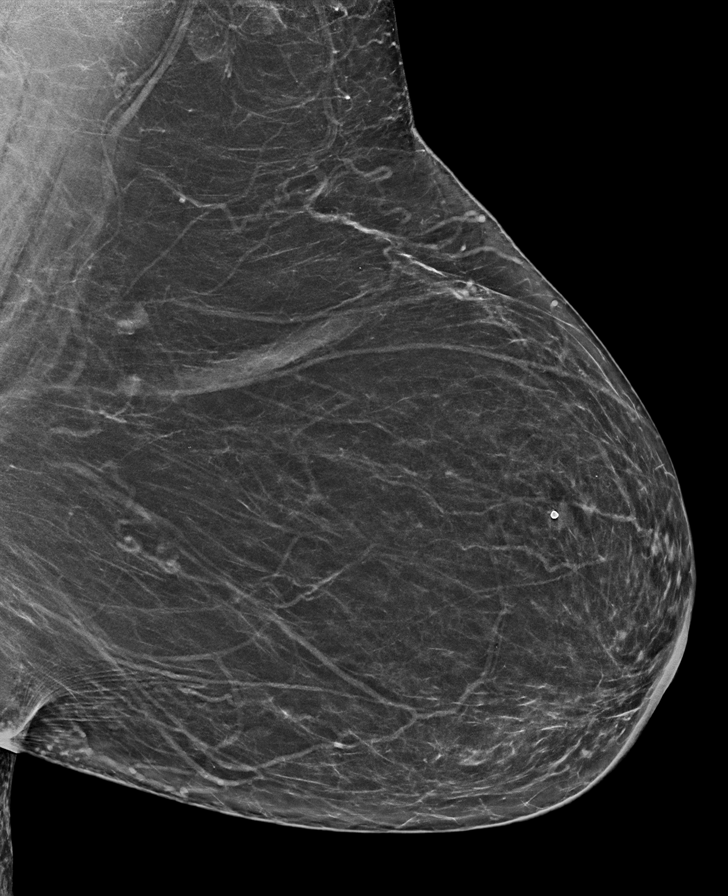

[L CC tomo · tomo slice 29/57.0]
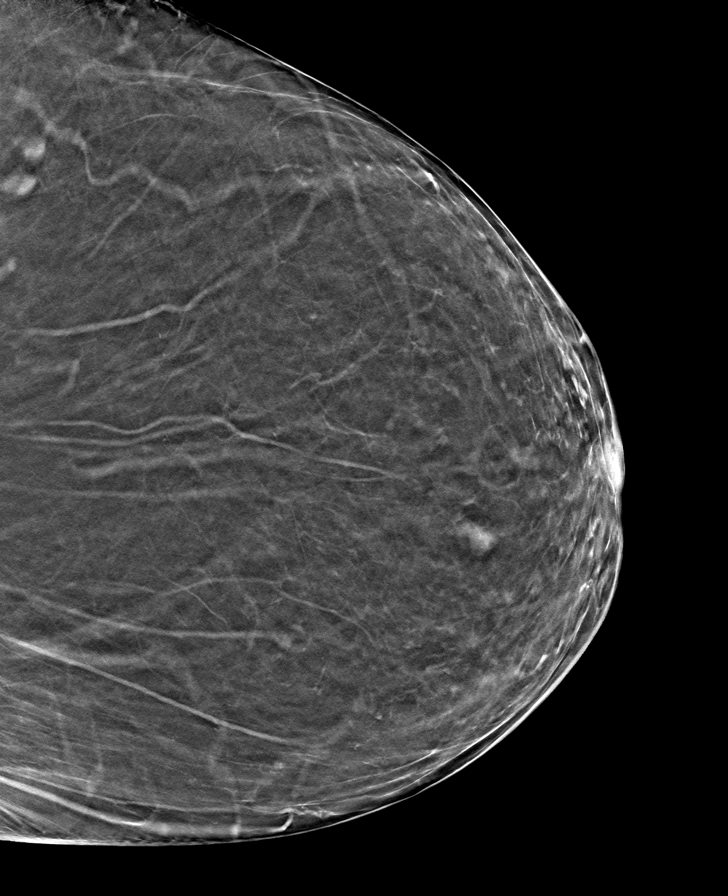

[8 of 40 positions shown; findings below may reference images not displayed]

ACR Breast Density Category b: There are scattered areas of
fibroglandular density.
FINDINGS: There are no findings suspicious for malignancy. Images were
processed with CAD.
IMPRESSION: No mammographic evidence of malignancy. A result letter of this
screening mammogram will be mailed directly to the patient.

RECOMMENDATION:
Screening mammogram in one year. (Code:[id number])

BI-RADS CATEGORY  1: Negative.

## 2021-09-27 ENCOUNTER — Other Ambulatory Visit: Payer: Medicare HMO

## 2021-09-30 ENCOUNTER — Other Ambulatory Visit (INDEPENDENT_AMBULATORY_CARE_PROVIDER_SITE_OTHER): Payer: Medicare HMO

## 2021-09-30 DIAGNOSIS — E871 Hypo-osmolality and hyponatremia: Secondary | ICD-10-CM | POA: Diagnosis not present

## 2021-09-30 LAB — BASIC METABOLIC PANEL
BUN: 23 mg/dL (ref 6–23)
CO2: 30 mEq/L (ref 19–32)
Calcium: 9.1 mg/dL (ref 8.4–10.5)
Chloride: 102 mEq/L (ref 96–112)
Creatinine, Ser: 0.88 mg/dL (ref 0.40–1.20)
GFR: 64.85 mL/min (ref >60.00)
Glucose, Bld: 82 mg/dL (ref 70–99)
Potassium: 4.7 mEq/L (ref 3.5–5.1)
Sodium: 140 mEq/L (ref 135–145)

## 2021-10-02 ENCOUNTER — Ambulatory Visit
Admission: RE | Admit: 2021-10-02 | Discharge: 2021-10-02 | Disposition: A | Discharge status: Home / Self Care | Payer: Medicare HMO | Source: Ambulatory Visit | Attending: Internal Medicine | Admitting: Internal Medicine

## 2021-10-02 DIAGNOSIS — D7389 Other diseases of spleen: Secondary | ICD-10-CM | POA: Diagnosis not present

## 2021-10-02 DIAGNOSIS — K6389 Other specified diseases of intestine: Secondary | ICD-10-CM | POA: Diagnosis not present

## 2021-10-02 DIAGNOSIS — K5792 Diverticulitis of intestine, part unspecified, without perforation or abscess without bleeding: Secondary | ICD-10-CM | POA: Diagnosis not present

## 2021-10-02 DIAGNOSIS — K429 Umbilical hernia without obstruction or gangrene: Secondary | ICD-10-CM | POA: Diagnosis not present

## 2021-10-02 DIAGNOSIS — K5730 Diverticulosis of large intestine without perforation or abscess without bleeding: Secondary | ICD-10-CM | POA: Diagnosis not present

## 2021-10-03 DIAGNOSIS — H903 Sensorineural hearing loss, bilateral: Secondary | ICD-10-CM | POA: Diagnosis not present

## 2021-10-03 DIAGNOSIS — H6123 Impacted cerumen, bilateral: Secondary | ICD-10-CM | POA: Diagnosis not present

## 2021-10-03 DIAGNOSIS — H6063 Unspecified chronic otitis externa, bilateral: Secondary | ICD-10-CM | POA: Diagnosis not present

## 2021-10-07 ENCOUNTER — Other Ambulatory Visit: Payer: Medicare HMO

## 2021-10-23 ENCOUNTER — Encounter: Payer: Self-pay | Admitting: Gastroenterology

## 2021-10-23 NOTE — H&P (Signed)
Pre-Procedure H&P   Patient ID: Savannah Hood is a 76 y.o. female.  Gastroenterology Provider: Annamaria Helling, DO  PCP: Savannah Mc, MD  Date: 10/24/2021  HPI [name] is a 76 y.o. female who presents today for Colonoscopy for Follow-up uncomplicated diverticulitis-April.  Patient had uncomplicated sigmoid diverticulitis in April of this year.  Treated with 10-day course of Augmentin with improving symptoms. Pt has fully recovered from her acute event. Normal BM, no pain/fever/chills. No melena or hematochezia  Last csy 09/2016 with Dr. Earlean Shawl- 1 sigmoid TA; sigmoid diverticulosis  Pt may have had colon resection at age 54- she is unsure;  S/p hysterectomy  Father- h/o polyps;   Past Medical History:  Diagnosis Date   Arthritis    Colon polyps    Glaucoma    Hyperlipidemia    Hypertension    Obesity     Past Surgical History:  Procedure Laterality Date   ABDOMINAL HYSTERECTOMY     total, secondary to uterine polyps, Dr. Rayford Halsted   COLON SURGERY      Family History Father- polyps No h/o GI disease or malignancy  Review of Systems  Constitutional:  Negative for activity change, appetite change, chills, diaphoresis, fatigue, fever and unexpected weight change.  HENT:  Negative for trouble swallowing and voice change.   Respiratory:  Negative for shortness of breath and wheezing.   Cardiovascular:  Negative for chest pain, palpitations and leg swelling.  Gastrointestinal:  Negative for abdominal distention, abdominal pain, anal bleeding, blood in stool, constipation, diarrhea, nausea, rectal pain and vomiting.  Musculoskeletal:  Negative for arthralgias and myalgias.  Skin:  Negative for color change and pallor.  Neurological:  Negative for dizziness, syncope and weakness.  Psychiatric/Behavioral:  Negative for confusion.   All other systems reviewed and are negative.    Medications No current facility-administered medications on  file prior to encounter.   Current Outpatient Medications on File Prior to Encounter  Medication Sig Dispense Refill   amLODipine (NORVASC) 10 MG tablet TAKE ONE TABLET BY MOUTH EVERY DAY (Patient taking differently: Take 10 mg by mouth daily.) 90 tablet 3   Cholecalciferol (VITAMIN D3) 1000 units CAPS Take 2 capsules by mouth daily.      Clotrimazole 1 % OINT Apply 1 application. topically in the morning and at bedtime.     COD LIVER OIL PO Take 1 capsule by mouth daily.      latanoprost (XALATAN) 0.005 % ophthalmic solution Place 1 drop into both eyes daily.     losartan (COZAAR) 100 MG tablet TAKE 1 TABLET BY MOUTH DAILY (Patient taking differently: Take 100 mg by mouth daily.) 90 tablet 1   Misc Natural Products (TURMERIC CURCUMIN) CAPS Take 1 capsule by mouth daily.     mometasone (ELOCON) 0.1 % lotion PLACE 2 TO 4 DROPS INTO RIGHT EAR EACH WEEK     Multiple Vitamins-Minerals (PRESERVISION AREDS 2 PO) Take 1 tablet by mouth 2 (two) times daily.     timolol (TIMOPTIC) 0.5 % ophthalmic solution INSTILL 1 DROP INTO LEFT EYE EVERY DAY  6   Trolamine Salicylate (ASPERCREME) 10 % LOTN      vitamin B-12 (CYANOCOBALAMIN) 1000 MCG tablet Take 1,000 mcg by mouth daily.      Pertinent medications related to GI and procedure were reviewed by me with the patient prior to the procedure   Current Facility-Administered Medications:    0.9 %  sodium chloride infusion, , Intravenous, Continuous, Annamaria Helling,  DO      Allergies  Allergen Reactions   Iodinated Contrast Media Other (See Comments)    Burning sensation in her veins, and nausea   Allergies were reviewed by me prior to the procedure  Objective   Body mass index is 37.2 kg/m. Vitals:   10/24/21 0759  BP: 139/67  Pulse: 78  Resp: 18  Temp: (!) 96.2 F (35.7 C)  TempSrc: Temporal  SpO2: 100%  Weight: 104.5 kg  Height: '5\' 6"'$  (1.676 m)     Physical Exam Vitals and nursing note reviewed.  Constitutional:       General: She is not in acute distress.    Appearance: Normal appearance. She is obese. She is not ill-appearing, toxic-appearing or diaphoretic.  HENT:     Head: Normocephalic and atraumatic.     Nose: Nose normal.     Mouth/Throat:     Mouth: Mucous membranes are moist.     Pharynx: Oropharynx is clear.  Eyes:     General: No scleral icterus.    Extraocular Movements: Extraocular movements intact.  Cardiovascular:     Rate and Rhythm: Normal rate and regular rhythm.     Heart sounds: Normal heart sounds. No murmur heard.    No friction rub. No gallop.  Pulmonary:     Effort: Pulmonary effort is normal. No respiratory distress.     Breath sounds: Normal breath sounds. No wheezing, rhonchi or rales.  Abdominal:     General: Bowel sounds are normal. There is no distension.     Palpations: Abdomen is soft.     Tenderness: There is no abdominal tenderness. There is no guarding or rebound.  Musculoskeletal:     Cervical back: Neck supple.     Right lower leg: No edema.     Left lower leg: No edema.  Skin:    General: Skin is warm and dry.     Coloration: Skin is not jaundiced or pale.  Neurological:     General: No focal deficit present.     Mental Status: She is alert and oriented to person, place, and time. Mental status is at baseline.  Psychiatric:        Mood and Affect: Mood normal.        Behavior: Behavior normal.        Thought Content: Thought content normal.        Judgment: Judgment normal.      Assessment:  [name] is a 76 y.o. female  who presents today for Colonoscopy for follow-up diverticulitis.  Plan:  Colonoscopy with possible intervention today  Colonoscopy with possible biopsy, control of bleeding, polypectomy, and interventions as necessary has been discussed with the patient/patient representative. Informed consent was obtained from the patient/patient representative after explaining the indication, nature, and risks of the procedure  including but not limited to death, bleeding, perforation, missed neoplasm/lesions, cardiorespiratory compromise, and reaction to medications. Opportunity for questions was given and appropriate answers were provided. Patient/patient representative has verbalized understanding is amenable to undergoing the procedure.   Annamaria Helling, DO  [facility]  Portions of the record may have been created with voice recognition software. Occasional wrong-word or 'sound-a-like' substitutions may have occurred due to the inherent limitations of voice recognition software.  Read the chart carefully and recognize, using context, where substitutions may have occurred.

## 2021-10-24 ENCOUNTER — Encounter
Admission: RE | Disposition: A | Discharge status: Home / Self Care | Payer: Self-pay | Source: Home / Self Care | Attending: Gastroenterology

## 2021-10-24 ENCOUNTER — Ambulatory Visit: Payer: Medicare HMO | Admitting: Anesthesiology

## 2021-10-24 ENCOUNTER — Ambulatory Visit
Admission: RE | Admit: 2021-10-24 | Discharge: 2021-10-24 | Disposition: A | Discharge status: Home / Self Care | Payer: Medicare HMO | Attending: Gastroenterology | Admitting: Gastroenterology

## 2021-10-24 ENCOUNTER — Encounter: Payer: Self-pay | Admitting: Gastroenterology

## 2021-10-24 DIAGNOSIS — Z09 Encounter for follow-up examination after completed treatment for conditions other than malignant neoplasm: Secondary | ICD-10-CM | POA: Insufficient documentation

## 2021-10-24 DIAGNOSIS — D126 Benign neoplasm of colon, unspecified: Secondary | ICD-10-CM | POA: Diagnosis not present

## 2021-10-24 DIAGNOSIS — H409 Unspecified glaucoma: Secondary | ICD-10-CM | POA: Diagnosis not present

## 2021-10-24 DIAGNOSIS — K5140 Inflammatory polyps of colon without complications: Secondary | ICD-10-CM | POA: Diagnosis not present

## 2021-10-24 DIAGNOSIS — E669 Obesity, unspecified: Secondary | ICD-10-CM | POA: Diagnosis not present

## 2021-10-24 DIAGNOSIS — I10 Essential (primary) hypertension: Secondary | ICD-10-CM | POA: Diagnosis not present

## 2021-10-24 DIAGNOSIS — M1990 Unspecified osteoarthritis, unspecified site: Secondary | ICD-10-CM | POA: Insufficient documentation

## 2021-10-24 DIAGNOSIS — G4730 Sleep apnea, unspecified: Secondary | ICD-10-CM | POA: Insufficient documentation

## 2021-10-24 DIAGNOSIS — K635 Polyp of colon: Secondary | ICD-10-CM | POA: Diagnosis not present

## 2021-10-24 DIAGNOSIS — K5732 Diverticulitis of large intestine without perforation or abscess without bleeding: Secondary | ICD-10-CM | POA: Diagnosis not present

## 2021-10-24 DIAGNOSIS — D123 Benign neoplasm of transverse colon: Secondary | ICD-10-CM | POA: Diagnosis not present

## 2021-10-24 DIAGNOSIS — Z6837 Body mass index (BMI) 37.0-37.9, adult: Secondary | ICD-10-CM | POA: Insufficient documentation

## 2021-10-24 DIAGNOSIS — G2581 Restless legs syndrome: Secondary | ICD-10-CM | POA: Diagnosis not present

## 2021-10-24 DIAGNOSIS — K5730 Diverticulosis of large intestine without perforation or abscess without bleeding: Secondary | ICD-10-CM | POA: Diagnosis not present

## 2021-10-24 HISTORY — PX: COLONOSCOPY: SHX5424

## 2021-10-24 SURGERY — COLONOSCOPY
Anesthesia: General

## 2021-10-24 MED ORDER — SODIUM CHLORIDE 0.9 % IV SOLN
INTRAVENOUS | Status: DC
Start: 2021-10-24 — End: 2021-10-24
  Administered 2021-10-24: 1000 mL via INTRAVENOUS

## 2021-10-24 MED ORDER — PROPOFOL 10 MG/ML IV BOLUS
INTRAVENOUS | Status: DC | PRN
  Administered 2021-10-24: 50 mg via INTRAVENOUS

## 2021-10-24 MED ORDER — LIDOCAINE HCL (CARDIAC) PF 100 MG/5ML IV SOSY
PREFILLED_SYRINGE | INTRAVENOUS | Status: DC | PRN
  Administered 2021-10-24: 80 mg via INTRAVENOUS

## 2021-10-24 MED ORDER — PROPOFOL 500 MG/50ML IV EMUL
INTRAVENOUS | Status: DC | PRN
  Administered 2021-10-24: 50 ug/kg/min via INTRAVENOUS

## 2021-10-24 NOTE — Anesthesia Postprocedure Evaluation (Signed)
Anesthesia Post Note  Patient: Savannah Hood  Procedure(s) Performed: COLONOSCOPY  Patient location during evaluation: Endoscopy Anesthesia Type: General Level of consciousness: awake and alert Pain management: pain level controlled Vital Signs Assessment: post-procedure vital signs reviewed and stable Respiratory status: spontaneous breathing, nonlabored ventilation, respiratory function stable and patient connected to nasal cannula oxygen Cardiovascular status: blood pressure returned to baseline and stable Postop Assessment: no apparent nausea or vomiting Anesthetic complications: no   No notable events documented.   Last Vitals:  Vitals:   10/24/21 0935 10/24/21 0941  BP: 108/72   Pulse:  64  Resp:    Temp: (!) 35.6 C   SpO2:      Last Pain:  Vitals:   10/24/21 0935  TempSrc: Temporal  PainSc: 0-No pain                 Dimas Millin

## 2021-10-24 NOTE — Op Note (Signed)
[facility] Patient Name: Savannah Hood Procedure Date: 10/24/2021 8:28 AM MRN: [id number] Account #: [id number] Date of Birth: [birth year] Admit Type: Outpatient Age: 76 Room: [facility] ENDO ROOM 3 Gender: Female Note Status: Finalized Instrument Name: Peds Colonoscope [id number] Procedure:             Colonoscopy Indications:           Follow-up of diverticulitis Providers:             Annamaria Helling DO, DO Referring MD:          Deborra Medina, MD (Referring MD) Medicines:             Monitored Anesthesia Care Complications:         No immediate complications. Estimated blood loss:                         Minimal. Procedure:             Pre-Anesthesia Assessment:                        - Prior to the procedure, a History and Physical was                         performed, and patient medications and allergies were                         reviewed. The patient is competent. The risks and                         benefits of the procedure and the sedation options and                         risks were discussed with the patient. All questions                         were answered and informed consent was obtained.                         Patient identification and proposed procedure were                         verified by the physician, the nurse, the anesthetist                         and the technician in the endoscopy suite. Mental                         Status Examination: alert and oriented. Airway                         Examination: normal oropharyngeal airway and neck                         mobility. Respiratory Examination: clear to                         auscultation. CV Examination: RRR, no murmurs, no S3  or S4. Prophylactic Antibiotics: The patient does not                         require prophylactic antibiotics. Prior                         Anticoagulants: The patient has taken no previous                          anticoagulant or antiplatelet agents. ASA Grade                         Assessment: II - A patient with mild systemic disease.                         After reviewing the risks and benefits, the patient                         was deemed in satisfactory condition to undergo the                         procedure. The anesthesia plan was to use monitored                         anesthesia care (MAC). Immediately prior to                         administration of medications, the patient was                         re-assessed for adequacy to receive sedatives. The                         heart rate, respiratory rate, oxygen saturations,                         blood pressure, adequacy of pulmonary ventilation, and                         response to care were monitored throughout the                         procedure. The physical status of the patient was                         re-assessed after the procedure.                        After obtaining informed consent, the colonoscope was                         passed under direct vision. Throughout the procedure,                         the patient's blood pressure, pulse, and oxygen                         saturations were monitored continuously. The  Colonoscope was introduced through the anus and                         advanced to the the terminal ileum, with                         identification of the appendiceal orifice and IC                         valve. The colonoscopy was technically difficult and                         complex due to multiple diverticula in the colon,                         sleep apnea and RLS. The patient tolerated the                         procedure well. The quality of the bowel preparation                         was evaluated using the BBPS [facility] Bowel Preparation                         Scale) with scores of: Right Colon = 3, Transverse                         Colon = 3  and Left Colon = 3 (entire mucosa seen well                         with no residual staining, small fragments of stool or                         opaque liquid). The total BBPS score equals 9. The                         terminal ileum, ileocecal valve, appendiceal orifice,                         and rectum were photographed. Findings:      The perianal and digital rectal examinations were normal. Pertinent       negatives include normal sphincter tone.      The terminal ileum appeared normal. Estimated blood loss: none.      Multiple small-mouthed diverticula were found in the recto-sigmoid colon       and sigmoid colon. Estimated blood loss: none.      A 3 to 4 mm polyp was found in the transverse colon. The polyp was       sessile. The polyp was removed with a cold snare. Resection and       retrieval were complete. Estimated blood loss was minimal.      Two sessile polyps were found in the sigmoid colon. The polyps were 1 to       2 mm in size. These polyps were removed with a jumbo cold forceps.       Resection and retrieval were complete. Estimated blood loss was minimal.      A  3 to 4 mm polyp was found in the sigmoid colon. The polyp was sessile.       The polyp was removed with a cold snare. Resection and retrieval were       complete. Placed with sigmoid polyps taken with forceps. Estimated blood       loss was minimal.      A 5 to 7 mm polyp was found in the sigmoid colon. The polyp was sessile.       The polyp was removed with a cold snare. Resection and retrieval were       complete. Estimated blood loss was minimal. Area was tattooed with an       injection of 1 mL of [place] ink. Placed just distal to the polyp (~25 cm)       Due to positioning and persistent patient movement due to RLS, this       polyp was technically difficult to resect. Clean up around the area was       performed with forceps. This polyp was placed in a separate pathology       bottle. Estimated blood  loss was minimal.      The exam was otherwise without abnormality on direct and retroflexion       views. Impression:            - The examined portion of the ileum was normal.                        - Diverticulosis in the recto-sigmoid colon and in the                         sigmoid colon.                        - One 3 to 4 mm polyp in the transverse colon, removed                         with a cold snare. Resected and retrieved.                        - Two 1 to 2 mm polyps in the sigmoid colon, removed                         with a jumbo cold forceps. Resected and retrieved.                        - One 3 to 4 mm polyp in the sigmoid colon, removed                         with a cold snare. Resected and retrieved.                        - One 5 to 7 mm polyp in the sigmoid colon, removed                         with a cold snare. Resected and retrieved. Tattooed.                        - The examination was otherwise normal on direct and  retroflexion views. Recommendation:        - Patient has a contact number available for                         emergencies. The signs and symptoms of potential                         delayed complications were discussed with the patient.                         Return to normal activities tomorrow. Written                         discharge instructions were provided to the patient.                        - Discharge patient to home.                        - Resume previous diet.                        - Continue present medications.                        - No aspirin, ibuprofen, naproxen, or other                         non-steroidal anti-inflammatory drugs for 5 days after                         polyp removal.                        - Await pathology results.                        - Repeat colonoscopy in 6 months to review the                         polypectomy site.                        - Return to GI office as  previously scheduled.                        - The findings and recommendations were discussed with                         the patient. Procedure Code(s):     --- Professional ---                        [phone], Colonoscopy, flexible; with removal of                         tumor(s), polyp(s), or other lesion(s) by snare                         technique  91916, 62, Colonoscopy, flexible; with biopsy, single                         or multiple                        45381, Colonoscopy, flexible; with directed submucosal                         injection(s), any substance Diagnosis Code(s):     --- Professional ---                        K63.5, Polyp of colon                        K57.32, Diverticulitis of large intestine without                         perforation or abscess without bleeding                        K57.30, Diverticulosis of large intestine without                         perforation or abscess without bleeding CPT copyright 2019 American Medical Association. All rights reserved. The codes documented in this report are preliminary and upon coder review may  be revised to meet current compliance requirements. Attending Participation:      I personally performed the entire procedure. Volney American, DO Annamaria Helling DO, DO 10/24/2021 9:21:03 AM This report has been signed electronically. Number of Addenda: 0 Note Initiated On: 10/24/2021 8:28 AM Scope Withdrawal Time: 0 hours 31 minutes 27 seconds  Total Procedure Duration: 0 hours 38 minutes 15 seconds  Estimated Blood Loss:  Estimated blood loss was minimal.      [facility]

## 2021-10-24 NOTE — Interval H&P Note (Signed)
History and Physical Interval Note: Preprocedure H&P from 10/24/21  was reviewed and there was no interval change after seeing and examining the patient.  Written consent was obtained from the patient after discussion of risks, benefits, and alternatives. Patient has consented to proceed with Colonoscopy with possible intervention   10/24/2021 8:20 AM  Savannah Hood  has presented today for surgery, with the diagnosis of Diverticulitis large intestine w/o perforation or abscess w/o bleeding (K57.32).  The various methods of treatment have been discussed with the patient and family. After consideration of risks, benefits and other options for treatment, the patient has consented to  Procedure(s): COLONOSCOPY (N/A) as a surgical intervention.  The patient's history has been reviewed, patient examined, no change in status, stable for surgery.  I have reviewed the patient's chart and labs.  Questions were answered to the patient's satisfaction.     Annamaria Helling

## 2021-10-24 NOTE — Transfer of Care (Signed)
Immediate Anesthesia Transfer of Care Note  Patient: Savannah Hood  Procedure(s) Performed: COLONOSCOPY  Patient Location: PACU  Anesthesia Type:General  Level of Consciousness: sedated  Airway & Oxygen Therapy: Patient Spontanous Breathing and Patient connected to nasal cannula oxygen  Post-op Assessment: Report given to RN and Post -op Vital signs reviewed and stable  Post vital signs: Reviewed and stable  Last Vitals:  Vitals Value Taken Time  BP 92/57 10/24/21 0921  Temp    Pulse 64 10/24/21 0921  Resp 14 10/24/21 0921  SpO2 92 % 10/24/21 0921  Vitals shown include unvalidated device data.  Last Pain:  Vitals:   10/24/21 0915  TempSrc:   PainSc: 0-No pain         Complications: No notable events documented.

## 2021-10-24 NOTE — Anesthesia Preprocedure Evaluation (Signed)
Anesthesia Evaluation  Patient identified by MRN, date of birth, ID band Patient awake    Reviewed: Allergy & Precautions, NPO status , Patient's Chart, lab work & pertinent test results  Airway Mallampati: III  TM Distance: >3 FB Neck ROM: full    Dental  (+) Poor Dentition   Pulmonary neg pulmonary ROS, neg shortness of breath, neg sleep apnea, neg COPD, neg recent URI,    Pulmonary exam normal        Cardiovascular hypertension, (-) CAD, (-) Past MI and (-) CABG negative cardio ROS Normal cardiovascular exam     Neuro/Psych negative neurological ROS  negative psych ROS   GI/Hepatic negative GI ROS, Neg liver ROS,   Endo/Other  negative endocrine ROS  Renal/GU negative Renal ROS  negative genitourinary   Musculoskeletal   Abdominal   Peds  Hematology negative hematology ROS (+)   Anesthesia Other Findings Past Medical History: No date: Arthritis No date: Colon polyps No date: Glaucoma No date: Hyperlipidemia No date: Hypertension No date: Obesity  Past Surgical History: No date: ABDOMINAL HYSTERECTOMY     Comment:  total, secondary to uterine polyps, Dr. Rayford Halsted No date: COLON SURGERY  BMI    Body Mass Index: 37.20 kg/m      Reproductive/Obstetrics negative OB ROS                             Anesthesia Physical Anesthesia Plan  ASA: 2  Anesthesia Plan: General   Post-op Pain Management: Minimal or no pain anticipated   Induction: Intravenous  PONV Risk Score and Plan: 3 and Propofol infusion, TIVA and Ondansetron  Airway Management Planned: Nasal Cannula  Additional Equipment: None  Intra-op Plan:   Post-operative Plan:   Informed Consent: I have reviewed the patients History and Physical, chart, labs and discussed the procedure including the risks, benefits and alternatives for the proposed anesthesia with the patient or authorized representative who has  indicated his/her understanding and acceptance.     Dental advisory given  Plan Discussed with: CRNA and Surgeon  Anesthesia Plan Comments: (Discussed risks of anesthesia with patient, including possibility of difficulty with spontaneous ventilation under anesthesia necessitating airway intervention, PONV, and rare risks such as cardiac or respiratory or neurological events, and allergic reactions. Discussed the role of CRNA in patient's perioperative care. Patient understands.)        Anesthesia Quick Evaluation

## 2021-10-25 ENCOUNTER — Encounter: Payer: Self-pay | Admitting: Gastroenterology

## 2021-10-25 LAB — HM COLONOSCOPY

## 2021-10-25 LAB — SURGICAL PATHOLOGY

## 2021-11-26 DIAGNOSIS — Z6837 Body mass index (BMI) 37.0-37.9, adult: Secondary | ICD-10-CM | POA: Diagnosis not present

## 2021-11-26 DIAGNOSIS — E669 Obesity, unspecified: Secondary | ICD-10-CM | POA: Diagnosis not present

## 2021-11-26 DIAGNOSIS — K5730 Diverticulosis of large intestine without perforation or abscess without bleeding: Secondary | ICD-10-CM | POA: Diagnosis not present

## 2021-11-26 DIAGNOSIS — Z8371 Family history of colonic polyps: Secondary | ICD-10-CM | POA: Diagnosis not present

## 2021-11-26 DIAGNOSIS — D126 Benign neoplasm of colon, unspecified: Secondary | ICD-10-CM | POA: Diagnosis not present

## 2021-11-26 DIAGNOSIS — R1032 Left lower quadrant pain: Secondary | ICD-10-CM | POA: Diagnosis not present

## 2021-12-30 ENCOUNTER — Other Ambulatory Visit: Payer: Self-pay | Admitting: Internal Medicine

## 2022-01-13 ENCOUNTER — Encounter: Payer: Self-pay | Admitting: Internal Medicine

## 2022-02-06 DIAGNOSIS — H401131 Primary open-angle glaucoma, bilateral, mild stage: Secondary | ICD-10-CM | POA: Diagnosis not present

## 2022-02-20 ENCOUNTER — Ambulatory Visit
Admission: RE | Admit: 2022-02-20 | Discharge: 2022-02-20 | Disposition: A | Discharge status: Home / Self Care | Payer: Medicare HMO | Source: Ambulatory Visit | Attending: Internal Medicine | Admitting: Internal Medicine

## 2022-02-20 DIAGNOSIS — Z1231 Encounter for screening mammogram for malignant neoplasm of breast: Secondary | ICD-10-CM | POA: Diagnosis not present

## 2022-02-24 ENCOUNTER — Other Ambulatory Visit: Payer: Self-pay | Admitting: Internal Medicine

## 2022-04-03 DIAGNOSIS — H6123 Impacted cerumen, bilateral: Secondary | ICD-10-CM | POA: Diagnosis not present

## 2022-04-03 DIAGNOSIS — H903 Sensorineural hearing loss, bilateral: Secondary | ICD-10-CM | POA: Diagnosis not present

## 2022-05-05 ENCOUNTER — Telehealth: Payer: Self-pay | Admitting: Internal Medicine

## 2022-05-05 NOTE — Telephone Encounter (Signed)
Left message for patient to call back and schedule Medicare Annual Wellness Visit (AWV).   Please offer to do by telephone.  Left office number and my jabber [phone].  Last AWV:05/14/2021   Please schedule at anytime with Nurse Health Advisor.

## 2022-05-12 ENCOUNTER — Telehealth: Payer: Self-pay | Admitting: Internal Medicine

## 2022-05-12 NOTE — Telephone Encounter (Signed)
Patient returned my call and I missed the call. I left another message for patient to return my call and schedule AWV.

## 2022-05-12 NOTE — Telephone Encounter (Signed)
Contacted Savannah Hood to schedule their annual wellness visit. Appointment made for 05/19/2022.  Thank you,  [place] Direct dial  [phone]

## 2022-05-19 ENCOUNTER — Ambulatory Visit (INDEPENDENT_AMBULATORY_CARE_PROVIDER_SITE_OTHER): Payer: Medicare HMO

## 2022-05-19 VITALS — Ht 66.0 in | Wt 229.0 lb

## 2022-05-19 DIAGNOSIS — Z1231 Encounter for screening mammogram for malignant neoplasm of breast: Secondary | ICD-10-CM

## 2022-05-19 DIAGNOSIS — Z0000 Encounter for general adult medical examination without abnormal findings: Secondary | ICD-10-CM | POA: Diagnosis not present

## 2022-05-19 NOTE — Patient Instructions (Addendum)
Savannah Hood , Thank you for taking time to come for your Medicare Wellness Visit. I appreciate your ongoing commitment to your health goals. Please review the following plan we discussed and let me know if I can assist you in the future.   These are the goals we discussed:  Goals       Patient Stated     I would like to lose a little weight (pt-stated)      Healthy diet choices  Stay hydrated Stay active      Other     Follow up with Primary Care Provider      As needed.        This is a list of the screening recommended for you and due dates:  Health Maintenance  Topic Date Due   COVID-19 Vaccine (7 - 2023-24 season) 06/04/2022*   Mammogram  02/21/2023   Medicare Annual Wellness Visit  05/20/2023   Colon Cancer Screening  10/26/2026   DTaP/Tdap/Td vaccine (4 - Td or Tdap) 01/05/2031   Pneumonia Vaccine  Completed   Flu Shot  Completed   DEXA scan (bone density measurement)  Completed   Hepatitis C Screening: USPSTF Recommendation to screen - Ages 25-79 yo.  Completed   Zoster (Shingles) Vaccine  Completed   HPV Vaccine  Aged Out  *Topic was postponed. The date shown is not the original due date.    Advanced directives: on file  Conditions/risks identified: none new  Next appointment: Follow up in one year for your annual wellness visit    Preventive Care 65 Years and Older, Female Preventive care refers to lifestyle choices and visits with your health care provider that can promote health and wellness. What does preventive care include? A yearly physical exam. This is also called an annual well check. Dental exams once or twice a year. Routine eye exams. Ask your health care provider how often you should have your eyes checked. Personal lifestyle choices, including: Daily care of your teeth and gums. Regular physical activity. Eating a healthy diet. Avoiding tobacco and drug use. Limiting alcohol use. Practicing safe sex. Taking low-dose aspirin every  day. Taking vitamin and mineral supplements as recommended by your health care provider. What happens during an annual well check? The services and screenings done by your health care provider during your annual well check will depend on your age, overall health, lifestyle risk factors, and family history of disease. Counseling  Your health care provider may ask you questions about your: Alcohol use. Tobacco use. Drug use. Emotional well-being. Home and relationship well-being. Sexual activity. Eating habits. History of falls. Memory and ability to understand (cognition). Work and work Statistician. Reproductive health. Screening  You may have the following tests or measurements: Height, weight, and BMI. Blood pressure. Lipid and cholesterol levels. These may be checked every 5 years, or more frequently if you are over 46 years old. Skin check. Lung cancer screening. You may have this screening every year starting at age 48 if you have a 30-pack-year history of smoking and currently smoke or have quit within the past 15 years. Fecal occult blood test (FOBT) of the stool. You may have this test every year starting at age 37. Flexible sigmoidoscopy or colonoscopy. You may have a sigmoidoscopy every 5 years or a colonoscopy every 10 years starting at age 35. Hepatitis C blood test. Hepatitis B blood test. Sexually transmitted disease (STD) testing. Diabetes screening. This is done by checking your blood sugar (glucose) after you have  not eaten for a while (fasting). You may have this done every 1-3 years. Bone density scan. This is done to screen for osteoporosis. You may have this done starting at age 75. Mammogram. This may be done every 1-2 years. Talk to your health care provider about how often you should have regular mammograms. Talk with your health care provider about your test results, treatment options, and if necessary, the need for more tests. Vaccines  Your health care  provider may recommend certain vaccines, such as: Influenza vaccine. This is recommended every year. Tetanus, diphtheria, and acellular pertussis (Tdap, Td) vaccine. You may need a Td booster every 10 years. Zoster vaccine. You may need this after age 63. Pneumococcal 13-valent conjugate (PCV13) vaccine. One dose is recommended after age 54. Pneumococcal polysaccharide (PPSV23) vaccine. One dose is recommended after age 45. Talk to your health care provider about which screenings and vaccines you need and how often you need them. This information is not intended to replace advice given to you by your health care provider. Make sure you discuss any questions you have with your health care provider. Document Released: 04/06/2015 Document Revised: 11/28/2015 Document Reviewed: 01/09/2015 Elsevier Interactive Patient Education  2017 [place] Prevention in the Home Falls can cause injuries. They can happen to people of all ages. There are many things you can do to make your home safe and to help prevent falls. What can I do on the outside of my home? Regularly fix the edges of walkways and driveways and fix any cracks. Remove anything that might make you trip as you walk through a door, such as a raised step or threshold. Trim any bushes or trees on the path to your home. Use bright outdoor lighting. Clear any walking paths of anything that might make someone trip, such as rocks or tools. Regularly check to see if handrails are loose or broken. Make sure that both sides of any steps have handrails. Any raised decks and porches should have guardrails on the edges. Have any leaves, snow, or ice cleared regularly. Use sand or salt on walking paths during winter. Clean up any spills in your garage right away. This includes oil or grease spills. What can I do in the bathroom? Use night lights. Install grab bars by the toilet and in the tub and shower. Do not use towel bars as grab  bars. Use non-skid mats or decals in the tub or shower. If you need to sit down in the shower, use a plastic, non-slip stool. Keep the floor dry. Clean up any water that spills on the floor as soon as it happens. Remove soap buildup in the tub or shower regularly. Attach bath mats securely with double-sided non-slip rug tape. Do not have throw rugs and other things on the floor that can make you trip. What can I do in the bedroom? Use night lights. Make sure that you have a light by your bed that is easy to reach. Do not use any sheets or blankets that are too big for your bed. They should not hang down onto the floor. Have a firm chair that has side arms. You can use this for support while you get dressed. Do not have throw rugs and other things on the floor that can make you trip. What can I do in the kitchen? Clean up any spills right away. Avoid walking on wet floors. Keep items that you use a lot in easy-to-reach places. If you need to  reach something above you, use a strong step stool that has a grab bar. Keep electrical cords out of the way. Do not use floor polish or wax that makes floors slippery. If you must use wax, use non-skid floor wax. Do not have throw rugs and other things on the floor that can make you trip. What can I do with my stairs? Do not leave any items on the stairs. Make sure that there are handrails on both sides of the stairs and use them. Fix handrails that are broken or loose. Make sure that handrails are as long as the stairways. Check any carpeting to make sure that it is firmly attached to the stairs. Fix any carpet that is loose or worn. Avoid having throw rugs at the top or bottom of the stairs. If you do have throw rugs, attach them to the floor with carpet tape. Make sure that you have a light switch at the top of the stairs and the bottom of the stairs. If you do not have them, ask someone to add them for you. What else can I do to help prevent  falls? Wear shoes that: Do not have high heels. Have rubber bottoms. Are comfortable and fit you well. Are closed at the toe. Do not wear sandals. If you use a stepladder: Make sure that it is fully opened. Do not climb a closed stepladder. Make sure that both sides of the stepladder are locked into place. Ask someone to hold it for you, if possible. Clearly mark and make sure that you can see: Any grab bars or handrails. First and last steps. Where the edge of each step is. Use tools that help you move around (mobility aids) if they are needed. These include: Canes. Walkers. Scooters. Crutches. Turn on the lights when you go into a dark area. Replace any light bulbs as soon as they burn out. Set up your furniture so you have a clear path. Avoid moving your furniture around. If any of your floors are uneven, fix them. If there are any pets around you, be aware of where they are. Review your medicines with your doctor. Some medicines can make you feel dizzy. This can increase your chance of falling. Ask your doctor what other things that you can do to help prevent falls. This information is not intended to replace advice given to you by your health care provider. Make sure you discuss any questions you have with your health care provider. Document Released: 01/04/2009 Document Revised: 08/16/2015 Document Reviewed: 04/14/2014 Elsevier Interactive Patient Education  2017 [employer].

## 2022-05-19 NOTE — Progress Notes (Addendum)
Subjective:   Savannah Hood is a 77 y.o. female who presents for Medicare Annual (Subsequent) preventive examination.  Review of Systems    No ROS.  Medicare Wellness Virtual Visit.  Visual/audio telehealth visit, UTA vital signs.   See social history for additional risk factors.   Cardiac Risk Factors include: advanced age (>85mn, >>75women)     Objective:    Today's Vitals   05/19/22 1537  Weight: 229 lb (103.9 kg)  Height: '5\' 6"'$  (1.676 m)   Body mass index is 36.96 kg/m.     05/19/2022    3:34 PM 10/24/2021    7:57 AM 07/05/2021    1:00 PM 07/05/2021   10:06 AM 05/14/2021   12:48 PM 06/13/2020    4:47 PM 05/11/2020   11:42 AM  Advanced Directives  Does Patient Have a Medical Advance Directive? Yes Yes Yes Yes Yes Yes Yes  Type of AParamedicof [place] will [place]Living will Healthcare Power of [place]Living will [place]Living will [place]akesideLiving will [place]Living will  Does patient want to make changes to medical advance directive? No - Patient declined  No - Patient declined  No - Patient declined No - Patient declined No - Patient declined  Copy of [place] Chart? Yes - validated most recent copy scanned in chart (See row information)    No - copy requested  No - copy requested    Current Medications (verified) Outpatient Encounter Medications as of 05/19/2022  Medication Sig   amLODipine (NORVASC) 10 MG tablet TAKE ONE TABLET BY MOUTH EVERY DAY   Cholecalciferol (VITAMIN D3) 1000 units CAPS Take 2 capsules by mouth daily.    Clotrimazole 1 % OINT Apply 1 application. topically in the morning and at bedtime.   COD LIVER OIL PO Take 1 capsule by mouth daily.    latanoprost (XALATAN) 0.005 % ophthalmic solution Place 1 drop into both eyes daily.   losartan (COZAAR) 100 MG tablet TAKE 1 TABLET  BY MOUTH DAILY   Misc Natural Products (TURMERIC CURCUMIN) CAPS Take 1 capsule by mouth daily.   mometasone (ELOCON) 0.1 % lotion PLACE 2 TO 4 DROPS INTO RIGHT EAR EACH WEEK   Multiple Vitamins-Minerals (PRESERVISION AREDS 2 PO) Take 1 tablet by mouth 2 (two) times daily.   timolol (TIMOPTIC) 0.5 % ophthalmic solution INSTILL 1 DROP INTO LEFT EYE EVERY DAY   Trolamine Salicylate (ASPERCREME) 10 % LOTN    vitamin B-12 (CYANOCOBALAMIN) 1000 MCG tablet Take 1,000 mcg by mouth daily.   No facility-administered encounter medications on file as of 05/19/2022.    Allergies (verified) Iodinated contrast media   History: Past Medical History:  Diagnosis Date   Arthritis    Colon polyps    Glaucoma    Hyperlipidemia    Hypertension    Obesity    Past Surgical History:  Procedure Laterality Date   ABDOMINAL HYSTERECTOMY     total, secondary to uterine polyps, Dr. KRayford Halsted  COLON SURGERY     COLONOSCOPY N/A 10/24/2021   Procedure: COLONOSCOPY;  Surgeon: RAnnamaria Helling DO;  [facility]  Service: Gastroenterology;  Laterality: N/A;   Family History  Problem Relation Age of Onset   Arthritis Mother    Arthritis Sister    Cancer Father        prostate   Diabetes Father    Diabetes Paternal Grandmother  Breast cancer Neg Hx    Social History   Socioeconomic History   Marital status: Married    Spouse name: Not on file   Number of children: Not on file   Years of education: Not on file   Highest education level: Not on file  Occupational History   Not on file  Tobacco Use   Smoking status: Never   Smokeless tobacco: Never  Vaping Use   Vaping Use: Never used  Substance and Sexual Activity   Alcohol use: Yes    Comment: rarely   Drug use: No   Sexual activity: Not on file  Other Topics Concern   Not on file  Social History Narrative   Lives in [place] with husband, has 2 children. 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 Pets - 2 dogs.    Social  Determinants of Health   Financial Resource Strain: Low Risk  (05/12/2022)   Overall Financial Resource Strain (CARDIA)    Difficulty of Paying Living Expenses: Not hard at all  Food Insecurity: No Food Insecurity (05/12/2022)   Hunger Vital Sign    Worried About Running Out of Food in the Last Year: Never true    Ran Out of Food in the Last Year: Never true  Transportation Needs: No Transportation Needs (05/12/2022)   PRAPARE - Hydrologist (Medical): No    Lack of Transportation (Non-Medical): No  Physical Activity: Sufficiently Active (05/12/2022)   Exercise Vital Sign    Days of Exercise per Week: 5 days    Minutes of Exercise per Session: 40 min  Stress: No Stress Concern Present (05/12/2022)   [place]    Feeling of Stress : Not at all  Social Connections: Unknown (05/12/2022)   Social Connection and Isolation Panel [NHANES]    Frequency of Communication with Friends and Family: More than three times a week    Frequency of Social Gatherings with Friends and Family: More than three times a week    Attends Religious Services: Not on Advertising copywriter or Organizations: No    Attends Archivist Meetings: Never    Marital Status: Married    Tobacco Counseling Counseling given: Not Answered   Clinical Intake:  Pre-visit preparation completed: Yes        Diabetes: No  How often do you need to have someone help you when you read instructions, pamphlets, or other written materials from your doctor or pharmacy?: 1 - Never    Interpreter Needed?: No      Activities of Daily Living    05/12/2022   10:34 PM 07/05/2021    1:00 PM  In your present state of health, do you have any difficulty performing the following activities:  Hearing? 0 0  Vision? 0 1  Difficulty concentrating or making decisions? 0 0  Walking or climbing stairs? 0 1  Dressing or  bathing? 0 0  Doing errands, shopping? 0 0  Preparing Food and eating ? N   Using the Toilet? N   In the past six months, have you accidently leaked urine? N   Do you have problems with loss of bowel control? N   Managing your Medications? N   Managing your Finances? N   Housekeeping or managing your Housekeeping? N     Patient Care Team: Crecencio Mc, MD as PCP - General (Internal Medicine)  Indicate any recent Medical Services you may have  received from other than [facility] providers in the past year (date may be approximate).     Assessment:   This is a routine wellness examination for Sheema.  I connected with  Siona Montag on 05/19/22 by a audio enabled telemedicine application and verified that I am speaking with the correct person using two identifiers.  Patient Location: Home  Provider Location: Office/Clinic  I discussed the limitations of evaluation and management by telemedicine. The patient expressed understanding and agreed to proceed.   Patient Medicare AWV questionnaire was completed by the patient on 05/12/22, I have confirmed that all information answered by patient is correct and no changes since this date.   Hearing/Vision screen Hearing Screening - Comments:: Hearing aid, bilateral  Vision Screening - Comments:: Followed by [facility], Dr. Edison Pace Wears corrective lenses They have seen their ophthalmologist.  Dietary issues and exercise activities discussed: Current Exercise Habits: Home exercise routine, Type of exercise: calisthenics, Time (Minutes): 40, Frequency (Times/Week): 5, Weekly Exercise (Minutes/Week): 200, Intensity: Mild Intermittent fast Mediterranean diet Lean meat diet Good water intake    Goals Addressed               This Visit's Progress     Patient Stated     I would like to lose a little weight (pt-stated)        Healthy diet choices  Stay hydrated Stay active       Depression Screen    05/19/2022     3:35 PM 07/19/2021   10:50 AM 06/12/2021   10:13 AM 05/14/2021   12:51 PM 06/07/2020    4:00 PM 05/11/2020   11:40 AM 05/11/2019    1:27 PM  PHQ 2/9 Scores  PHQ - 2 Score 0 0 0 0 0 0 0  PHQ- 9 Score     1      Fall Risk    05/12/2022   10:34 PM 07/19/2021   10:50 AM 07/10/2021   11:02 AM 06/12/2021   10:13 AM 05/14/2021   12:50 PM  Fall Risk   Falls in the past year? 0 0 0 0 0  Number falls in past yr:     0  Risk for fall due to :  No Fall Risks No Fall Risks No Fall Risks   Follow up  Falls evaluation completed Falls evaluation completed Falls evaluation completed Falls evaluation completed    [place]: Home free of loose throw rugs in walkways, pet beds, electrical cords, etc? Yes  Adequate lighting in your home to reduce risk of falls? Yes   ASSISTIVE DEVICES UTILIZED TO PREVENT FALLS: Life alert? No  Use of a cane, walker or w/c? No  Grab bars in the bathroom? No  Shower chair or bench in shower? No  Elevated toilet seat or a handicapped toilet? No   TIMED UP AND GO: Was the test performed? No .   Cognitive Function:        05/19/2022    3:42 PM 05/11/2019    1:29 PM  6CIT Screen  What Year? 0 points 0 points  What month? 0 points 0 points  What time? 0 points 0 points  Count back from 20 0 points 0 points  Months in reverse 0 points 0 points  Repeat phrase 0 points 0 points  Total Score 0 points 0 points    Immunizations Immunization History  Administered Date(s) Administered   Fluad Quad(high Dose 65+) 11/19/2018, 01/01/2022  Influenza Split 01/27/2011, 01/17/2016   Influenza, High Dose Seasonal PF 01/09/2017, 12/22/2017   Influenza,inj,Quad PF,6+ Mos 03/15/2013, 12/15/2013   Influenza-Unspecified 12/26/2019, 12/20/2020   PFIZER(Purple Top)SARS-COV-2 Vaccination 04/14/2019, 05/05/2019, 12/19/2019, 06/29/2020   Pfizer Covid-19 Vaccine Bivalent Booster 47yr & up 12/28/2020, 12/24/2021   Pneumococcal Conjugate-13  08/28/2015   Pneumococcal Polysaccharide-23 03/11/2013   Respiratory Syncytial Virus Vaccine,Recomb Aduvanted(Arexvy) 01/08/2022   Tdap 01/27/2011, 11/10/2020, 01/04/2021   Zoster Recombinat (Shingrix) 09/09/2017, 12/02/2017   Zoster, Live 11/20/2011   Screening Tests Health Maintenance  Topic Date Due   COVID-19 Vaccine (7 - 2023-24 season) 06/04/2022 (Originally 02/18/2022)   MAMMOGRAM  02/21/2023   Medicare Annual Wellness (AWV)  05/20/2023   COLONOSCOPY (Pts 45-433yrInsurance coverage will need to be confirmed)  10/26/2026   DTaP/Tdap/Td (4 - Td or Tdap) 01/05/2031   Pneumonia Vaccine 6554Years old  Completed   INFLUENZA VACCINE  Completed   DEXA SCAN  Completed   Hepatitis C Screening  Completed   Zoster Vaccines- Shingrix  Completed   HPV VACCINES  Aged Out    Health Maintenance  There are no preventive care reminders to display for this patient.  Lung Cancer Screening: (Low Dose CT Chest recommended if Age 75-80ears, 30 pack-year currently smoking OR have quit w/in 15years.) does not qualify.   Hepatitis C Screening: Completed 08/2016.  Vision Screening: Recommended annual ophthalmology exams for early detection of glaucoma and other disorders of the eye.  Dental Screening: Recommended annual dental exams for proper oral hygiene  Community Resource Referral / Chronic Care Management: CRR required this visit?  No   CCM required this visit?  No      Plan:     I have personally reviewed and noted the following in the patient's chart:   Medical and social history Use of alcohol, tobacco or illicit drugs  Current medications and supplements including opioid prescriptions. Patient is not currently taking opioid prescriptions. Functional ability and status Nutritional status Physical activity Advanced directives List of other physicians Hospitalizations, surgeries, and ER visits in previous 12 months Vitals Screenings to include cognitive, depression, and  falls Referrals and appointments  In addition, I have reviewed and discussed with patient certain preventive protocols, quality metrics, and best practice recommendations. A written personalized care plan for preventive services as well as general preventive health recommendations were provided to patient.     DeIndian HillsLPN   [id number]   I have reviewed the above information and agree with above.   TeDeborra MedinaMD

## 2022-06-16 ENCOUNTER — Ambulatory Visit (INDEPENDENT_AMBULATORY_CARE_PROVIDER_SITE_OTHER): Payer: Medicare HMO | Admitting: Internal Medicine

## 2022-06-16 ENCOUNTER — Ambulatory Visit (INDEPENDENT_AMBULATORY_CARE_PROVIDER_SITE_OTHER): Payer: Medicare HMO

## 2022-06-16 VITALS — BP 128/70 | HR 80 | Temp 98.3°F | Resp 16 | Ht 67.0 in | Wt 228.4 lb

## 2022-06-16 DIAGNOSIS — I10 Essential (primary) hypertension: Secondary | ICD-10-CM | POA: Diagnosis not present

## 2022-06-16 DIAGNOSIS — R059 Cough, unspecified: Secondary | ICD-10-CM

## 2022-06-16 DIAGNOSIS — E7849 Other hyperlipidemia: Secondary | ICD-10-CM

## 2022-06-16 DIAGNOSIS — Z0001 Encounter for general adult medical examination with abnormal findings: Secondary | ICD-10-CM

## 2022-06-16 DIAGNOSIS — E6609 Other obesity due to excess calories: Secondary | ICD-10-CM

## 2022-06-16 DIAGNOSIS — R944 Abnormal results of kidney function studies: Secondary | ICD-10-CM

## 2022-06-16 DIAGNOSIS — M5431 Sciatica, right side: Secondary | ICD-10-CM | POA: Diagnosis not present

## 2022-06-16 DIAGNOSIS — E2839 Other primary ovarian failure: Secondary | ICD-10-CM | POA: Diagnosis not present

## 2022-06-16 DIAGNOSIS — E66812 Obesity, class 2: Secondary | ICD-10-CM

## 2022-06-16 DIAGNOSIS — Z6836 Body mass index (BMI) 36.0-36.9, adult: Secondary | ICD-10-CM | POA: Diagnosis not present

## 2022-06-16 LAB — CBC WITH DIFFERENTIAL/PLATELET
Basophils Absolute: 0 10*3/uL (ref 0.0–0.1)
Basophils Relative: 0.7 % (ref 0.0–3.0)
Eosinophils Absolute: 0.2 10*3/uL (ref 0.0–0.7)
Eosinophils Relative: 3.1 % (ref 0.0–5.0)
HCT: 41.8 % (ref 36.0–46.0)
Hemoglobin: 14.2 g/dL (ref 12.0–15.0)
Lymphocytes Relative: 18.6 % (ref 12.0–46.0)
Lymphs Abs: 1.1 10*3/uL (ref 0.7–4.0)
MCHC: 34 g/dL (ref 30.0–36.0)
MCV: 91.7 fl (ref 78.0–100.0)
Monocytes Absolute: 0.4 10*3/uL (ref 0.1–1.0)
Monocytes Relative: 6.5 % (ref 3.0–12.0)
Neutro Abs: 4.2 10*3/uL (ref 1.4–7.7)
Neutrophils Relative %: 71.1 % (ref 43.0–77.0)
Platelets: 189 10*3/uL (ref 150.0–400.0)
RBC: 4.56 Mil/uL (ref 3.87–5.11)
RDW: 13.4 % (ref 11.5–15.5)
WBC: 5.9 10*3/uL (ref 4.0–10.5)

## 2022-06-16 LAB — COMPREHENSIVE METABOLIC PANEL
ALT: 16 U/L (ref 0–35)
AST: 19 U/L (ref 0–37)
Albumin: 3.9 g/dL (ref 3.5–5.2)
Alkaline Phosphatase: 90 U/L (ref 39–117)
BUN: 20 mg/dL (ref 6–23)
CO2: 25 mEq/L (ref 19–32)
Calcium: 9.1 mg/dL (ref 8.4–10.5)
Chloride: 104 mEq/L (ref 96–112)
Creatinine, Ser: 1.01 mg/dL (ref 0.40–1.20)
GFR: 54.69 mL/min — ABNORMAL LOW (ref >60.00)
Glucose, Bld: 109 mg/dL — ABNORMAL HIGH (ref 70–99)
Potassium: 4.2 mEq/L (ref 3.5–5.1)
Sodium: 137 mEq/L (ref 135–145)
Total Bilirubin: 0.6 mg/dL (ref 0.2–1.2)
Total Protein: 6.5 g/dL (ref 6.0–8.3)

## 2022-06-16 LAB — LIPID PANEL
Cholesterol: 268 mg/dL — ABNORMAL HIGH (ref 0–200)
HDL: 53 mg/dL (ref >39.00)
LDL Cholesterol: 180 mg/dL — ABNORMAL HIGH (ref 0–99)
NonHDL: 214.55
Total CHOL/HDL Ratio: 5
Triglycerides: 175 mg/dL — ABNORMAL HIGH (ref 0.0–149.0)
VLDL: 35 mg/dL (ref 0.0–40.0)

## 2022-06-16 LAB — LDL CHOLESTEROL, DIRECT: Direct LDL: 161 mg/dL

## 2022-06-16 MED ORDER — IPRATROPIUM BROMIDE 0.03 % NA SOLN: 2.0000 | Freq: Two times a day (BID) | NASAL | 12 refills | Status: DC

## 2022-06-16 MED ORDER — BENZONATATE 200 MG PO CAPS: 200.0000 mg | ORAL_CAPSULE | Freq: Three times a day (TID) | ORAL | 0 refills | Status: DC | PRN

## 2022-06-16 MED ORDER — OMEPRAZOLE 40 MG PO CPDR: 40.0000 mg | DELAYED_RELEASE_CAPSULE | Freq: Every day | ORAL | 0 refills | Status: DC

## 2022-06-16 NOTE — Patient Instructions (Addendum)
Your annual mammogram AND  DEXA  SCAN have been ordered.  Please call [facility] to call to make your appointments  .  The phone number for [facility] is  [phone]  Your cough may be coming from reflux, allergies, inflammation or post nasal drip (PND). I'm going to treat you for all 4 to try to "knock it out":  Continue nighttime  benadryl and add Atrovent nasal spray in the evening and in the morning   Omeprazole once daily in the evening for reflux induced cough  Tessalon perles (capsules) to suppress the cough; use every 8 to 12 hours   If the cough does not improve  With these measures,  Let me know

## 2022-06-16 NOTE — Assessment & Plan Note (Signed)
She has lost 11 lbs with portion reduction and intermittent fasting

## 2022-06-16 NOTE — Assessment & Plan Note (Addendum)
Started after a mechanical fall in Autumn 2022 and lasted over a year;   it was the worst  in early December , but has resolved after massage and PT

## 2022-06-16 NOTE — Progress Notes (Unsigned)
Patient ID: Savannah Hood, female    DOB: 23-Aug-1945  Age: 77 y.o. MRN: [id number]  The patient is here for annual  wellness examination and management of other chronic and acute problems.   The risk factors are reflected in the social history.  The roster of all physicians providing medical care to patient - is listed in the Snapshot section of the chart.  Activities of daily living:  The patient is 100% independent in all ADLs: dressing, toileting, feeding as well as independent mobility  Home safety : The patient has smoke detectors in the home. They wear seatbelts.  There are no firearms at home. There is no violence in the home.   There is no risks for hepatitis, STDs or HIV. There is no   history of blood transfusion. They have no travel history to infectious disease endemic areas of the world.  The patient has seen their dentist in the last six month. They have seen their eye doctor in the last year. They admit to slight hearing difficulty with regard to whispered voices and some television programs.  They have deferred audiologic testing in the last year.  They do not  have excessive sun exposure. Discussed the need for sun protection: hats, long sleeves and use of sunscreen if there is significant sun exposure.   Diet: the importance of a healthy diet is discussed. They do have a healthy diet.  The benefits of regular aerobic exercise were discussed. She walks 4 times per week ,  20 minutes.   Depression screen: there are no signs or vegative symptoms of depression- irritability, change in appetite, anhedonia, sadness/tearfullness.  Cognitive assessment: the patient manages all their financial and personal affairs and is actively engaged. They could relate day,date,year and events; recalled 2/3 objects at 3 minutes; performed clock-face test normally.  The following portions of the patient's history were reviewed and updated as appropriate: allergies, current medications, past  family history, past medical history,  past surgical history, past social history  and problem list.  Visual acuity was not assessed per patient preference since she has regular follow up with her ophthalmologist. Hearing and body mass index were assessed and reviewed.   During the course of the visit the patient was educated and counseled about appropriate screening and preventive services including : fall prevention , diabetes screening, nutrition counseling, colorectal cancer screening, and recommended immunizations.    CC: There were no encounter diagnoses.  1) persistent cough :  produces scant mucus  has been occurring for years, some PND>  .  Taking benadryl History Savannah Hood has a past medical history of Arthritis, Colon polyps, Glaucoma, Hyperlipidemia, Hypertension, and Obesity.   She has a past surgical history that includes Abdominal hysterectomy; Colon surgery; and Colonoscopy (N/A, 10/24/2021).   Her family history includes Arthritis in her mother and sister; Cancer in her father; Diabetes in her father and paternal grandmother.She reports that she has never smoked. She has never used smokeless tobacco. She reports current alcohol use. She reports that she does not use drugs.  Outpatient Medications Prior to Visit  Medication Sig Dispense Refill   Ascorbic Acid (VITAMIN C) 1000 MG tablet Take 1,000 mg by mouth daily.     Probiotic Product (CULTURELLE PROBIOTICS PO) Take 1 capsule by mouth daily.     amLODipine (NORVASC) 10 MG tablet TAKE ONE TABLET BY MOUTH EVERY DAY 90 tablet 3   Cholecalciferol (VITAMIN D3) 1000 units CAPS Take 2 capsules by mouth daily.  Clotrimazole 1 % OINT Apply 1 application. topically in the morning and at bedtime.     COD LIVER OIL PO Take 1 capsule by mouth daily.      latanoprost (XALATAN) 0.005 % ophthalmic solution Place 1 drop into both eyes daily.     losartan (COZAAR) 100 MG tablet TAKE 1 TABLET BY MOUTH DAILY 90 tablet 1   Misc Natural  Products (TURMERIC CURCUMIN) CAPS Take 1 capsule by mouth daily.     mometasone (ELOCON) 0.1 % lotion PLACE 2 TO 4 DROPS INTO RIGHT EAR EACH WEEK     Multiple Vitamins-Minerals (PRESERVISION AREDS 2 PO) Take 1 tablet by mouth 2 (two) times daily.     timolol (TIMOPTIC) 0.5 % ophthalmic solution INSTILL 1 DROP INTO LEFT EYE EVERY DAY  6   Trolamine Salicylate (ASPERCREME) 10 % LOTN      vitamin B-12 (CYANOCOBALAMIN) 1000 MCG tablet Take 1,000 mcg by mouth daily.     No facility-administered medications prior to visit.    Review of Systems  Objective:  BP 128/70   Pulse 80   Temp 98.3 F (36.8 C)   Resp 16   Ht 5\' 7"  (1.702 m)   Wt 228 lb 6.4 oz (103.6 kg)   SpO2 98%   BMI 35.77 kg/m   Physical Exam  Assessment & Plan:  There are no diagnoses linked to this encounter.    I provided 40 minutes of  face-to-face time during this encounter reviewing patient's current problems and past surgeries,  recent labs and imaging studies, providing counseling on the above mentioned problems , and coordination  of care .   Follow-up: No follow-ups on file.   Crecencio Mc, MD

## 2022-06-17 ENCOUNTER — Encounter: Payer: Self-pay | Admitting: Internal Medicine

## 2022-06-17 DIAGNOSIS — R059 Cough, unspecified: Secondary | ICD-10-CM | POA: Insufficient documentation

## 2022-06-17 LAB — TSH: TSH: 2.79 u[IU]/mL (ref 0.35–5.50)

## 2022-06-17 NOTE — Assessment & Plan Note (Signed)
Discussed the possible etiologies of persistent cough including but not limited to post nasal drip,  Allergic rhinitis,  Asthma,  GERD and chronic cyclic cough due to persistent irritation.  She is not taking an ACE Inhibitor.  Suggested treating for allergic rhinitis,  PND and GERD an using cough suppressant to break cycle and reevaluation in a few weeks.  Chest x ray ordered.

## 2022-06-17 NOTE — Assessment & Plan Note (Signed)

## 2022-06-17 NOTE — Assessment & Plan Note (Signed)
Blood pressure is at goal on  amlodipine and Cozaar.  Lab Results  Component Value Date   NA 137 06/16/2022   K 4.2 06/16/2022   CL 104 06/16/2022   CO2 25 06/16/2022   Lab Results  Component Value Date   CREATININE 1.01 06/16/2022

## 2022-06-17 NOTE — Assessment & Plan Note (Signed)
She has aortic atherosclerosis ; statin therapy was again advised regardless of risk computed with FRC or AHA.  She continues to decline statin therapy   Lab Results  Component Value Date   CHOL 268 (H) 06/16/2022   HDL 53.00 06/16/2022   LDLCALC 180 (H) 06/16/2022   LDLDIRECT 161.0 06/16/2022   TRIG 175.0 (H) 06/16/2022   CHOLHDL 5 06/16/2022

## 2022-06-18 ENCOUNTER — Encounter: Payer: Self-pay | Admitting: Internal Medicine

## 2022-06-18 DIAGNOSIS — R7301 Impaired fasting glucose: Secondary | ICD-10-CM

## 2022-06-18 DIAGNOSIS — E7849 Other hyperlipidemia: Secondary | ICD-10-CM

## 2022-06-18 NOTE — Addendum Note (Signed)
Addended by: Crecencio Mc on: 06/18/2022 08:45 PM   Modules accepted: Orders

## 2022-06-23 NOTE — Telephone Encounter (Signed)
Pt called in for a lab appt. Pt its booked for 4/12.

## 2022-07-04 ENCOUNTER — Other Ambulatory Visit (INDEPENDENT_AMBULATORY_CARE_PROVIDER_SITE_OTHER): Payer: Medicare HMO

## 2022-07-04 DIAGNOSIS — E7849 Other hyperlipidemia: Secondary | ICD-10-CM

## 2022-07-04 DIAGNOSIS — R7301 Impaired fasting glucose: Secondary | ICD-10-CM

## 2022-07-04 DIAGNOSIS — R944 Abnormal results of kidney function studies: Secondary | ICD-10-CM | POA: Diagnosis not present

## 2022-07-04 LAB — BASIC METABOLIC PANEL
BUN: 21 mg/dL (ref 6–23)
CO2: 27 mEq/L (ref 19–32)
Calcium: 9.1 mg/dL (ref 8.4–10.5)
Chloride: 104 mEq/L (ref 96–112)
Creatinine, Ser: 1.06 mg/dL (ref 0.40–1.20)
GFR: 51.59 mL/min — ABNORMAL LOW (ref >60.00)
Glucose, Bld: 99 mg/dL (ref 70–99)
Potassium: 4.4 mEq/L (ref 3.5–5.1)
Sodium: 139 mEq/L (ref 135–145)

## 2022-07-04 LAB — LIPID PANEL
Cholesterol: 246 mg/dL — ABNORMAL HIGH (ref 0–200)
HDL: 51.7 mg/dL (ref >39.00)
LDL Cholesterol: 164 mg/dL — ABNORMAL HIGH (ref 0–99)
NonHDL: 193.96
Total CHOL/HDL Ratio: 5
Triglycerides: 148 mg/dL (ref 0.0–149.0)
VLDL: 29.6 mg/dL (ref 0.0–40.0)

## 2022-07-04 LAB — HEMOGLOBIN A1C: Hgb A1c MFr Bld: 5.4 % (ref 4.6–6.5)

## 2022-07-04 LAB — LDL CHOLESTEROL, DIRECT: Direct LDL: 155 mg/dL

## 2022-07-07 ENCOUNTER — Other Ambulatory Visit: Payer: Self-pay | Admitting: Internal Medicine

## 2022-07-07 MED ORDER — ATORVASTATIN CALCIUM 20 MG PO TABS: 20.0000 mg | ORAL_TABLET | ORAL | 0 refills | Status: DC

## 2022-07-24 ENCOUNTER — Telehealth: Payer: Self-pay | Admitting: Internal Medicine

## 2022-07-24 DIAGNOSIS — E7849 Other hyperlipidemia: Secondary | ICD-10-CM

## 2022-07-24 NOTE — Telephone Encounter (Signed)
Lab has been ordered. Pt is aware that we cannot draw an A1c because she just had it done on 07/04/2022 and insurance will not pay for it. Pt gave a verbal understanding.

## 2022-07-24 NOTE — Telephone Encounter (Signed)
Patient called and stated Dr Darrick Huntsman wanted to redo her labs in 3 weeks to check liver. Appointment has been scheduled. Also, patient would like her A1c checked to make sure the medication is not increasing her levels.

## 2022-07-28 NOTE — Addendum Note (Signed)
Addended by: Warden Fillers on: 07/28/2022 12:18 PM   Modules accepted: Orders

## 2022-08-04 ENCOUNTER — Other Ambulatory Visit (INDEPENDENT_AMBULATORY_CARE_PROVIDER_SITE_OTHER): Payer: Medicare HMO

## 2022-08-04 DIAGNOSIS — E7849 Other hyperlipidemia: Secondary | ICD-10-CM | POA: Diagnosis not present

## 2022-08-05 LAB — HEPATIC FUNCTION PANEL
ALT: 21 U/L (ref 0–35)
AST: 22 U/L (ref 0–37)
Albumin: 3.9 g/dL (ref 3.5–5.2)
Alkaline Phosphatase: 90 U/L (ref 39–117)
Bilirubin, Direct: 0.1 mg/dL (ref 0.0–0.3)
Total Bilirubin: 0.5 mg/dL (ref 0.2–1.2)
Total Protein: 6.4 g/dL (ref 6.0–8.3)

## 2022-08-05 LAB — BASIC METABOLIC PANEL
BUN: 22 mg/dL (ref 6–23)
CO2: 27 mEq/L (ref 19–32)
Calcium: 9.2 mg/dL (ref 8.4–10.5)
Chloride: 104 mEq/L (ref 96–112)
Creatinine, Ser: 1.01 mg/dL (ref 0.40–1.20)
GFR: 54.64 mL/min — ABNORMAL LOW (ref >60.00)
Glucose, Bld: 93 mg/dL (ref 70–99)
Potassium: 4.8 mEq/L (ref 3.5–5.1)
Sodium: 138 mEq/L (ref 135–145)

## 2022-08-14 DIAGNOSIS — Z0100 Encounter for examination of eyes and vision without abnormal findings: Secondary | ICD-10-CM | POA: Diagnosis not present

## 2022-08-14 DIAGNOSIS — H3530 Unspecified macular degeneration: Secondary | ICD-10-CM | POA: Diagnosis not present

## 2022-08-14 DIAGNOSIS — H2513 Age-related nuclear cataract, bilateral: Secondary | ICD-10-CM | POA: Diagnosis not present

## 2022-08-14 DIAGNOSIS — H401131 Primary open-angle glaucoma, bilateral, mild stage: Secondary | ICD-10-CM | POA: Diagnosis not present

## 2022-08-25 ENCOUNTER — Other Ambulatory Visit: Payer: Self-pay | Admitting: Internal Medicine

## 2022-09-21 ENCOUNTER — Other Ambulatory Visit: Payer: Self-pay | Admitting: Internal Medicine

## 2022-09-23 MED ORDER — ATORVASTATIN CALCIUM 20 MG PO TABS: 20.0000 mg | ORAL_TABLET | ORAL | 0 refills | Status: DC

## 2022-09-30 DIAGNOSIS — Z0100 Encounter for examination of eyes and vision without abnormal findings: Secondary | ICD-10-CM | POA: Diagnosis not present

## 2022-10-01 DIAGNOSIS — H6121 Impacted cerumen, right ear: Secondary | ICD-10-CM | POA: Diagnosis not present

## 2022-10-01 DIAGNOSIS — H903 Sensorineural hearing loss, bilateral: Secondary | ICD-10-CM | POA: Diagnosis not present

## 2022-11-06 ENCOUNTER — Encounter (INDEPENDENT_AMBULATORY_CARE_PROVIDER_SITE_OTHER): Payer: Self-pay

## 2022-12-18 ENCOUNTER — Ambulatory Visit: Payer: Medicare HMO | Admitting: Internal Medicine

## 2022-12-18 VITALS — BP 120/70 | HR 64 | Temp 97.9°F | Resp 16 | Ht 67.0 in | Wt 225.4 lb

## 2022-12-18 DIAGNOSIS — R059 Cough, unspecified: Secondary | ICD-10-CM

## 2022-12-18 DIAGNOSIS — E785 Hyperlipidemia, unspecified: Secondary | ICD-10-CM | POA: Diagnosis not present

## 2022-12-18 DIAGNOSIS — E7849 Other hyperlipidemia: Secondary | ICD-10-CM

## 2022-12-18 DIAGNOSIS — M5431 Sciatica, right side: Secondary | ICD-10-CM

## 2022-12-18 DIAGNOSIS — I700 Atherosclerosis of aorta: Secondary | ICD-10-CM

## 2022-12-18 DIAGNOSIS — I10 Essential (primary) hypertension: Secondary | ICD-10-CM

## 2022-12-18 DIAGNOSIS — N1831 Chronic kidney disease, stage 3a: Secondary | ICD-10-CM | POA: Diagnosis not present

## 2022-12-18 LAB — COMPREHENSIVE METABOLIC PANEL
ALT: 24 U/L (ref 0–35)
AST: 24 U/L (ref 0–37)
Albumin: 3.9 g/dL (ref 3.5–5.2)
Alkaline Phosphatase: 88 U/L (ref 39–117)
BUN: 22 mg/dL (ref 6–23)
CO2: 27 mEq/L (ref 19–32)
Calcium: 9.1 mg/dL (ref 8.4–10.5)
Chloride: 107 mEq/L (ref 96–112)
Creatinine, Ser: 0.99 mg/dL (ref 0.40–1.20)
GFR: 55.82 mL/min — ABNORMAL LOW (ref >60.00)
Glucose, Bld: 101 mg/dL — ABNORMAL HIGH (ref 70–99)
Potassium: 4.7 mEq/L (ref 3.5–5.1)
Sodium: 140 mEq/L (ref 135–145)
Total Bilirubin: 0.7 mg/dL (ref 0.2–1.2)
Total Protein: 6.3 g/dL (ref 6.0–8.3)

## 2022-12-18 LAB — LDL CHOLESTEROL, DIRECT: Direct LDL: 83 mg/dL

## 2022-12-18 LAB — LIPID PANEL
Cholesterol: 168 mg/dL (ref 0–200)
HDL: 62.3 mg/dL (ref >39.00)
LDL Cholesterol: 81 mg/dL (ref 0–99)
NonHDL: 105.76
Total CHOL/HDL Ratio: 3
Triglycerides: 126 mg/dL (ref 0.0–149.0)
VLDL: 25.2 mg/dL (ref 0.0–40.0)

## 2022-12-18 LAB — CK: Total CK: 75 U/L (ref 7–177)

## 2022-12-18 NOTE — Progress Notes (Signed)
Subjective:  Patient ID: Savannah Hood, female    DOB: [birth year]  Age: 77 y.o. MRN: [id number]  CC: The primary encounter diagnosis was Abdominal aortic atherosclerosis (HCC). Diagnoses of Cough in adult patient, Primary hypertension, Hyperlipidemia LDL goal <100, Chronic sciatica, right, Stage 3a chronic kidney disease (HCC), and Familial hyperlipidemia, high LDL were also pertinent to this visit.   HPI Savannah Hood presents for  Chief Complaint  Patient presents with   Medical Management of Chronic Issues   1) Aortic atherosclerosis Reviewed findings of prior CT scan today..  Patient is tolerating high potency statin therapy    2) OVF:IEPPIRJJOACZ: patient checks blood pressure twice weekly at home.  Readings have been for the most part <130/80 at rest . Patient is following a reduced salt diet most days and is taking medications as prescribed   3) OBESITY: WEIGHT STABLE   4)  SCIATICA RESOLVED WITH PT, MASSAGE AND ACUPUNCTURE    Outpatient Medications Prior to Visit  Medication Sig Dispense Refill   amLODipine (NORVASC) 10 MG tablet TAKE ONE TABLET BY MOUTH EVERY DAY 90 tablet 3   Ascorbic Acid (VITAMIN C) 1000 MG tablet Take 1,000 mg by mouth daily.     atorvastatin (LIPITOR) 20 MG tablet Take 1 tablet (20 mg total) by mouth 2 (two) times a week. 26 tablet 0   Cholecalciferol (VITAMIN D3) 1000 units CAPS Take 2 capsules by mouth daily.      Clotrimazole 1 % OINT Apply 1 application. topically in the morning and at bedtime.     COD LIVER OIL PO Take 1 capsule by mouth daily.      latanoprost (XALATAN) 0.005 % ophthalmic solution Place 1 drop into both eyes daily.     losartan (COZAAR) 100 MG tablet TAKE 1 TABLET BY MOUTH DAILY 90 tablet 1   Misc Natural Products (TURMERIC CURCUMIN) CAPS Take 1 capsule by mouth daily.     mometasone (ELOCON) 0.1 % lotion PLACE 2 TO 4 DROPS INTO RIGHT EAR EACH WEEK     Multiple Vitamins-Minerals (PRESERVISION AREDS 2 PO) Take 1  tablet by mouth 2 (two) times daily.     timolol (TIMOPTIC) 0.5 % ophthalmic solution INSTILL 1 DROP INTO LEFT EYE EVERY DAY  6   Trolamine Salicylate (ASPERCREME) 10 % LOTN      vitamin B-12 (CYANOCOBALAMIN) 1000 MCG tablet Take 1,000 mcg by mouth daily.     benzonatate (TESSALON) 200 MG capsule Take 1 capsule (200 mg total) by mouth 3 (three) times daily as needed for cough. 90 capsule 0   ipratropium (ATROVENT) 0.03 % nasal spray Place 2 sprays into both nostrils every 12 (twelve) hours. 30 mL 12   omeprazole (PRILOSEC) 40 MG capsule Take 1 capsule (40 mg total) by mouth daily. 30 capsule 0   Probiotic Product (CULTURELLE PROBIOTICS PO) Take 1 capsule by mouth daily.     No facility-administered medications prior to visit.    Review of Systems;  Patient denies headache, fevers, malaise, unintentional weight loss, skin rash, eye pain, sinus congestion and sinus pain, sore throat, dysphagia,  hemoptysis , cough, dyspnea, wheezing, chest pain, palpitations, orthopnea, edema, abdominal pain, nausea, melena, diarrhea, constipation, flank pain, dysuria, hematuria, urinary  Frequency, nocturia, numbness, tingling, seizures,  Focal weakness, Loss of consciousness,  Tremor, insomnia, depression, anxiety, and suicidal ideation.      Objective:  BP 120/70   Pulse 64   Temp 97.9 F (36.6 C)   Resp 16   Ht  5\' 7"  (1.702 m)   Wt 225 lb 6.4 oz (102.2 kg)   SpO2 98%   BMI 35.30 kg/m   BP Readings from Last 3 Encounters:  12/18/22 120/70  06/16/22 128/70  10/24/21 116/66    Wt Readings from Last 3 Encounters:  12/18/22 225 lb 6.4 oz (102.2 kg)  06/16/22 228 lb 6.4 oz (103.6 kg)  05/19/22 229 lb (103.9 kg)    Physical Exam Vitals reviewed.  Constitutional:      General: She is not in acute distress.    Appearance: Normal appearance. She is normal weight. She is not ill-appearing, toxic-appearing or diaphoretic.  HENT:     Head: Normocephalic.  Eyes:     General: No scleral icterus.        Right eye: No discharge.        Left eye: No discharge.     Conjunctiva/sclera: Conjunctivae normal.  Cardiovascular:     Rate and Rhythm: Normal rate and regular rhythm.     Heart sounds: Normal heart sounds.  Pulmonary:     Effort: Pulmonary effort is normal. No respiratory distress.     Breath sounds: Normal breath sounds.  Musculoskeletal:        General: Normal range of motion.  Skin:    General: Skin is warm and dry.  Neurological:     General: No focal deficit present.     Mental Status: She is alert and oriented to person, place, and time. Mental status is at baseline.  Psychiatric:        Mood and Affect: Mood normal.        Behavior: Behavior normal.        Thought Content: Thought content normal.        Judgment: Judgment normal.    Lab Results  Component Value Date   HGBA1C 5.4 07/04/2022   HGBA1C 5.4 06/07/2020   HGBA1C 5.4 11/19/2018    Lab Results  Component Value Date   CREATININE 0.99 12/18/2022   CREATININE 1.01 08/04/2022   CREATININE 1.06 07/04/2022    Lab Results  Component Value Date   WBC 5.9 06/16/2022   HGB 14.2 06/16/2022   HCT 41.8 06/16/2022   PLT 189.0 06/16/2022   GLUCOSE 101 (H) 12/18/2022   CHOL 168 12/18/2022   TRIG 126.0 12/18/2022   HDL 62.30 12/18/2022   LDLDIRECT 83.0 12/18/2022   LDLCALC 81 12/18/2022   ALT 24 12/18/2022   AST 24 12/18/2022   NA 140 12/18/2022   K 4.7 12/18/2022   CL 107 12/18/2022   CREATININE 0.99 12/18/2022   BUN 22 12/18/2022   CO2 27 12/18/2022   TSH 2.79 06/16/2022   HGBA1C 5.4 07/04/2022   MICROALBUR 2.2 (H) 06/12/2021    MM 3D SCREEN BREAST BILATERAL  Result Date: 02/21/2022 CLINICAL DATA:  Screening. EXAM: DIGITAL SCREENING BILATERAL MAMMOGRAM WITH TOMOSYNTHESIS AND CAD TECHNIQUE: Bilateral screening digital craniocaudal and mediolateral oblique mammograms were obtained. Bilateral screening digital breast tomosynthesis was performed. The images were evaluated with computer-aided  detection. COMPARISON:  Previous exam(s). ACR Breast Density Category a: The breast tissue is almost entirely fatty. FINDINGS: There are no findings suspicious for malignancy. IMPRESSION: No mammographic evidence of malignancy. A result letter of this screening mammogram will be mailed directly to the patient. RECOMMENDATION: Screening mammogram in one year. (Code:[id number]-B-01Y) BI-RADS CATEGORY  1: Negative. Electronically Signed   By: Sande Brothers M.D.   On: 02/21/2022 11:00    Assessment & Plan:  .Abdominal aortic atherosclerosis (HCC)  Assessment & Plan: Reviewed findings of prior CT scan today..  Patient is tolerating high potency statin therapy  WITH ATORVASTAITN 20 MG    Cough in adult patient Assessment & Plan: Resolved w   Primary hypertension Assessment & Plan: Well controlled on current regimen. Renal function stable, no changes today.    Hyperlipidemia LDL goal <100 -     Comprehensive metabolic panel -     CK -     Lipid panel -     LDL cholesterol, direct  Chronic sciatica, right Assessment & Plan: Started after a mechanical fall in Autumn 2022 and lasted over a year;   it was the worst  in early December , but has resolved after massage and PT    Stage 3a chronic kidney disease (HCC) Assessment & Plan: Renal function is at  baseline with avoidance of NSAIDs.  She is on an ARB for control of hypertension, and avoiding NSAID use  Lab Results  Component Value Date   CREATININE 0.99 12/18/2022   Lab Results  Component Value Date   NA 140 12/18/2022   K 4.7 12/18/2022   CL 107 12/18/2022   CO2 27 12/18/2022      Familial hyperlipidemia, high LDL Assessment & Plan:  She has aortic atherosclerosis ; statin therapy tolerated and LDL is < 100  Lab Results  Component Value Date   CHOL 168 12/18/2022   HDL 62.30 12/18/2022   LDLCALC 81 12/18/2022   LDLDIRECT 83.0 12/18/2022   TRIG 126.0 12/18/2022   CHOLHDL 3 12/18/2022       Follow-up: No follow-ups  on file.   Sherlene Shams, MD

## 2022-12-18 NOTE — Assessment & Plan Note (Signed)
Reviewed findings of prior CT scan today..  Patient is tolerating high potency statin therapy  WITH ATORVASTAITN 20 MG

## 2022-12-18 NOTE — Patient Instructions (Signed)
Exercise: (supported back extension, standing)  Stand against a counter or sofa with your buttocks resting on the edge and your hands on the edge as well on either side of your back  Slowly lean backwards,  Bending from the waist, until you feel slight discomfort. Restore yourself to vertical position (up straight) Repeat the back extension 10 times , each time extending a little farther).  What should you expect? The pain should recede from the calf/thigh/buttocks but may localize to the lower back  If it does not,  Or if it makes the leg pain worse, STOP doing it.   If it results in improvement,  Repeat the exercise every 2-3 hours while awake and STOP THE OTHER EXERCISES that do the opposite motion (back flexion)

## 2022-12-18 NOTE — Assessment & Plan Note (Addendum)
Resolved w

## 2022-12-19 NOTE — Assessment & Plan Note (Signed)
She has aortic atherosclerosis ; statin therapy tolerated and LDL is < 100  Lab Results  Component Value Date   CHOL 168 12/18/2022   HDL 62.30 12/18/2022   LDLCALC 81 12/18/2022   LDLDIRECT 83.0 12/18/2022   TRIG 126.0 12/18/2022   CHOLHDL 3 12/18/2022

## 2022-12-19 NOTE — Progress Notes (Signed)
Subjective:  Patient ID: Savannah Hood, female    DOB: [birth year]  Age: 77 y.o. MRN: [id number]  CC: The primary encounter diagnosis was Abdominal aortic atherosclerosis (HCC). Diagnoses of Cough in adult patient, Primary hypertension, Hyperlipidemia LDL goal <100, Chronic sciatica, right, Stage 3a chronic kidney disease (HCC), and Familial hyperlipidemia, high LDL were also pertinent to this visit.   HPI [name] presents for  Chief Complaint  Patient presents with   Medical Management of Chronic Issues   1) Aortic atherosclerosis Reviewed findings of prior CT scan today..  Patient is tolerating high potency statin therapy    2) OVF:IEPPIRJJOACZ: patient checks blood pressure twice weekly at home.  Readings have been for the most part <130/80 at rest . Patient is following a reduced salt diet most days and is taking medications as prescribed   3) OBESITY: WEIGHT STABLE   4)  SCIATICA RESOLVED WITH PT, MASSAGE AND ACUPUNCTURE    Outpatient Medications Prior to Visit  Medication Sig Dispense Refill   amLODipine (NORVASC) 10 MG tablet TAKE ONE TABLET BY MOUTH EVERY DAY 90 tablet 3   Ascorbic Acid (VITAMIN C) 1000 MG tablet Take 1,000 mg by mouth daily.     atorvastatin (LIPITOR) 20 MG tablet Take 1 tablet (20 mg total) by mouth 2 (two) times a week. 26 tablet 0   Cholecalciferol (VITAMIN D3) 1000 units CAPS Take 2 capsules by mouth daily.      Clotrimazole 1 % OINT Apply 1 application. topically in the morning and at bedtime.     COD LIVER OIL PO Take 1 capsule by mouth daily.      latanoprost (XALATAN) 0.005 % ophthalmic solution Place 1 drop into both eyes daily.     losartan (COZAAR) 100 MG tablet TAKE 1 TABLET BY MOUTH DAILY 90 tablet 1   Misc Natural Products (TURMERIC CURCUMIN) CAPS Take 1 capsule by mouth daily.     mometasone (ELOCON) 0.1 % lotion PLACE 2 TO 4 DROPS INTO RIGHT EAR EACH WEEK     Multiple Vitamins-Minerals (PRESERVISION AREDS 2 PO) Take 1  tablet by mouth 2 (two) times daily.     timolol (TIMOPTIC) 0.5 % ophthalmic solution INSTILL 1 DROP INTO LEFT EYE EVERY DAY  6   Trolamine Salicylate (ASPERCREME) 10 % LOTN      vitamin B-12 (CYANOCOBALAMIN) 1000 MCG tablet Take 1,000 mcg by mouth daily.     benzonatate (TESSALON) 200 MG capsule Take 1 capsule (200 mg total) by mouth 3 (three) times daily as needed for cough. 90 capsule 0   ipratropium (ATROVENT) 0.03 % nasal spray Place 2 sprays into both nostrils every 12 (twelve) hours. 30 mL 12   omeprazole (PRILOSEC) 40 MG capsule Take 1 capsule (40 mg total) by mouth daily. 30 capsule 0   Probiotic Product (CULTURELLE PROBIOTICS PO) Take 1 capsule by mouth daily.     No facility-administered medications prior to visit.    Review of Systems;  Patient denies headache, fevers, malaise, unintentional weight loss, skin rash, eye pain, sinus congestion and sinus pain, sore throat, dysphagia,  hemoptysis , cough, dyspnea, wheezing, chest pain, palpitations, orthopnea, edema, abdominal pain, nausea, melena, diarrhea, constipation, flank pain, dysuria, hematuria, urinary  Frequency, nocturia, numbness, tingling, seizures,  Focal weakness, Loss of consciousness,  Tremor, insomnia, depression, anxiety, and suicidal ideation.      Objective:  BP 120/70   Pulse 64   Temp 97.9 F (36.6 C)   Resp 16   Ht  5\' 7"  (1.702 m)   Wt 225 lb 6.4 oz (102.2 kg)   SpO2 98%   BMI 35.30 kg/m   BP Readings from Last 3 Encounters:  12/18/22 120/70  06/16/22 128/70  10/24/21 116/66    Wt Readings from Last 3 Encounters:  12/18/22 225 lb 6.4 oz (102.2 kg)  06/16/22 228 lb 6.4 oz (103.6 kg)  05/19/22 229 lb (103.9 kg)    Physical Exam Vitals reviewed.  Constitutional:      General: She is not in acute distress.    Appearance: Normal appearance. She is normal weight. She is not ill-appearing, toxic-appearing or diaphoretic.  HENT:     Head: Normocephalic.  Eyes:     General: No scleral icterus.        Right eye: No discharge.        Left eye: No discharge.     Conjunctiva/sclera: Conjunctivae normal.  Cardiovascular:     Rate and Rhythm: Normal rate and regular rhythm.     Heart sounds: Normal heart sounds.  Pulmonary:     Effort: Pulmonary effort is normal. No respiratory distress.     Breath sounds: Normal breath sounds.  Musculoskeletal:        General: Normal range of motion.  Skin:    General: Skin is warm and dry.  Neurological:     General: No focal deficit present.     Mental Status: She is alert and oriented to person, place, and time. Mental status is at baseline.  Psychiatric:        Mood and Affect: Mood normal.        Behavior: Behavior normal.        Thought Content: Thought content normal.        Judgment: Judgment normal.    Lab Results  Component Value Date   HGBA1C 5.4 07/04/2022   HGBA1C 5.4 06/07/2020   HGBA1C 5.4 11/19/2018    Lab Results  Component Value Date   CREATININE 0.99 12/18/2022   CREATININE 1.01 08/04/2022   CREATININE 1.06 07/04/2022    Lab Results  Component Value Date   WBC 5.9 06/16/2022   HGB 14.2 06/16/2022   HCT 41.8 06/16/2022   PLT 189.0 06/16/2022   GLUCOSE 101 (H) 12/18/2022   CHOL 168 12/18/2022   TRIG 126.0 12/18/2022   HDL 62.30 12/18/2022   LDLDIRECT 83.0 12/18/2022   LDLCALC 81 12/18/2022   ALT 24 12/18/2022   AST 24 12/18/2022   NA 140 12/18/2022   K 4.7 12/18/2022   CL 107 12/18/2022   CREATININE 0.99 12/18/2022   BUN 22 12/18/2022   CO2 27 12/18/2022   TSH 2.79 06/16/2022   HGBA1C 5.4 07/04/2022   MICROALBUR 2.2 (H) 06/12/2021    MM 3D SCREEN BREAST BILATERAL  Result Date: 02/21/2022 CLINICAL DATA:  Screening. EXAM: DIGITAL SCREENING BILATERAL MAMMOGRAM WITH TOMOSYNTHESIS AND CAD TECHNIQUE: Bilateral screening digital craniocaudal and mediolateral oblique mammograms were obtained. Bilateral screening digital breast tomosynthesis was performed. The images were evaluated with computer-aided  detection. COMPARISON:  Previous exam(s). ACR Breast Density Category a: The breast tissue is almost entirely fatty. FINDINGS: There are no findings suspicious for malignancy. IMPRESSION: No mammographic evidence of malignancy. A result letter of this screening mammogram will be mailed directly to the patient. RECOMMENDATION: Screening mammogram in one year. (Code:[id number]-B-01Y) BI-RADS CATEGORY  1: Negative. Electronically Signed   By: Sande Brothers M.D.   On: 02/21/2022 11:00    Assessment & Plan:  .Abdominal aortic atherosclerosis (HCC)  Assessment & Plan: Reviewed findings of prior CT scan today..  Patient is tolerating high potency statin therapy  WITH ATORVASTAITN 20 MG    Cough in adult patient Assessment & Plan: Resolved w   Primary hypertension Assessment & Plan: Well controlled on current regimen. Renal function stable, no changes today.    Hyperlipidemia LDL goal <100 -     Comprehensive metabolic panel -     CK -     Lipid panel -     LDL cholesterol, direct  Chronic sciatica, right Assessment & Plan: Started after a mechanical fall in Autumn 2022 and lasted over a year;   it was the worst  in early December , but has resolved after massage and PT    Stage 3a chronic kidney disease (HCC) Assessment & Plan: Renal function is at  baseline with avoidance of NSAIDs.  She is on an ARB for control of hypertension, and avoiding NSAID use  Lab Results  Component Value Date   CREATININE 0.99 12/18/2022   Lab Results  Component Value Date   NA 140 12/18/2022   K 4.7 12/18/2022   CL 107 12/18/2022   CO2 27 12/18/2022      Familial hyperlipidemia, high LDL Assessment & Plan:  She has aortic atherosclerosis ; statin therapy tolerated and LDL is < 100  Lab Results  Component Value Date   CHOL 168 12/18/2022   HDL 62.30 12/18/2022   LDLCALC 81 12/18/2022   LDLDIRECT 83.0 12/18/2022   TRIG 126.0 12/18/2022   CHOLHDL 3 12/18/2022       Follow-up: No follow-ups  on file.   Sherlene Shams, MD

## 2022-12-19 NOTE — Assessment & Plan Note (Signed)
Renal function is at  baseline with avoidance of NSAIDs.  She is on an ARB for control of hypertension, and avoiding NSAID use  Lab Results  Component Value Date   CREATININE 0.99 12/18/2022   Lab Results  Component Value Date   NA 140 12/18/2022   K 4.7 12/18/2022   CL 107 12/18/2022   CO2 27 12/18/2022

## 2022-12-19 NOTE — Assessment & Plan Note (Signed)
Started after a mechanical fall in Autumn 2022 and lasted over a year;   it was the worst  in early December , but has resolved after massage and PT

## 2022-12-19 NOTE — Assessment & Plan Note (Signed)
Well controlled on current regimen. Renal function stable, no changes today. 

## 2022-12-20 ENCOUNTER — Other Ambulatory Visit: Payer: Self-pay | Admitting: Internal Medicine

## 2022-12-22 MED ORDER — ATORVASTATIN CALCIUM 20 MG PO TABS: 20.0000 mg | ORAL_TABLET | ORAL | 0 refills | Status: DC

## 2022-12-29 ENCOUNTER — Other Ambulatory Visit: Payer: Self-pay | Admitting: Internal Medicine

## 2023-02-03 DIAGNOSIS — H903 Sensorineural hearing loss, bilateral: Secondary | ICD-10-CM | POA: Diagnosis not present

## 2023-02-24 DIAGNOSIS — H401131 Primary open-angle glaucoma, bilateral, mild stage: Secondary | ICD-10-CM | POA: Diagnosis not present

## 2023-02-26 ENCOUNTER — Ambulatory Visit
Admission: RE | Admit: 2023-02-26 | Discharge: 2023-02-26 | Disposition: A | Discharge status: Home / Self Care | Payer: Medicare HMO | Source: Ambulatory Visit | Attending: Internal Medicine | Admitting: Internal Medicine

## 2023-02-26 DIAGNOSIS — E2839 Other primary ovarian failure: Secondary | ICD-10-CM

## 2023-02-26 DIAGNOSIS — M85852 Other specified disorders of bone density and structure, left thigh: Secondary | ICD-10-CM | POA: Diagnosis not present

## 2023-02-26 DIAGNOSIS — Z780 Asymptomatic menopausal state: Secondary | ICD-10-CM | POA: Diagnosis not present

## 2023-02-26 DIAGNOSIS — Z1231 Encounter for screening mammogram for malignant neoplasm of breast: Secondary | ICD-10-CM | POA: Diagnosis not present

## 2023-03-04 ENCOUNTER — Other Ambulatory Visit: Payer: Self-pay | Admitting: Internal Medicine

## 2023-03-04 DIAGNOSIS — H2513 Age-related nuclear cataract, bilateral: Secondary | ICD-10-CM | POA: Diagnosis not present

## 2023-03-04 DIAGNOSIS — H401131 Primary open-angle glaucoma, bilateral, mild stage: Secondary | ICD-10-CM | POA: Diagnosis not present

## 2023-03-04 DIAGNOSIS — H43813 Vitreous degeneration, bilateral: Secondary | ICD-10-CM | POA: Diagnosis not present

## 2023-03-04 DIAGNOSIS — H353132 Nonexudative age-related macular degeneration, bilateral, intermediate dry stage: Secondary | ICD-10-CM | POA: Diagnosis not present

## 2023-03-09 ENCOUNTER — Other Ambulatory Visit: Payer: Self-pay | Admitting: Internal Medicine

## 2023-05-18 ENCOUNTER — Encounter: Payer: Self-pay | Admitting: Internal Medicine

## 2023-05-18 ENCOUNTER — Telehealth: Payer: Self-pay | Admitting: Internal Medicine

## 2023-05-18 NOTE — Telephone Encounter (Signed)
 Left message to call and reschedule appointment to next available physical slot. Provider out of office on 06/22/2023.

## 2023-05-22 ENCOUNTER — Ambulatory Visit: Payer: Medicare HMO | Admitting: *Deleted

## 2023-05-22 VITALS — Ht 67.0 in | Wt 225.0 lb

## 2023-05-22 DIAGNOSIS — Z0000 Encounter for general adult medical examination without abnormal findings: Secondary | ICD-10-CM | POA: Diagnosis not present

## 2023-05-22 NOTE — Patient Instructions (Signed)
 Savannah Hood , Thank you for taking time to come for your Medicare Wellness Visit. I appreciate your ongoing commitment to your health goals. Please review the following plan we discussed and let me know if I can assist you in the future.   Referrals/Orders/Follow-Ups/Clinician Recommendations: None  This is a list of the screening recommended for you and due dates:  Health Maintenance  Topic Date Due   COVID-19 Vaccine (7 - 2024-25 season) 11/23/2022   Mammogram  02/26/2024   Medicare Annual Wellness Visit  05/21/2024   Colon Cancer Screening  10/26/2026   DTaP/Tdap/Td vaccine (4 - Td or Tdap) 01/05/2031   Pneumonia Vaccine  Completed   Flu Shot  Completed   DEXA scan (bone density measurement)  Completed   Hepatitis C Screening  Completed   Zoster (Shingles) Vaccine  Completed   HPV Vaccine  Aged Out    Advanced directives: (In Chart) A copy of your advanced directives are scanned into your chart should your provider ever need it.  Next Medicare Annual Wellness Visit scheduled for next year: Yes  05/25/24 @ 2:20

## 2023-05-22 NOTE — Progress Notes (Signed)
 Subjective:   Savannah Hood is a 78 y.o. who presents for a Medicare Wellness preventive visit.  Visit Complete: Virtual I connected with  Savannah Hood on 05/22/23 by a audio enabled telemedicine application and verified that I am speaking with the correct person using two identifiers.  Patient Location: Home  Provider Location: Home Office  I discussed the limitations of evaluation and management by telemedicine. The patient expressed understanding and agreed to proceed.  Vital Signs: Because this visit was a virtual/telehealth visit, some criteria may be missing or patient reported. Any vitals not documented were not able to be obtained and vitals that have been documented are patient reported.  VideoDeclined- This patient declined Librarian, academic. Therefore the visit was completed with audio only.  AWV Questionnaire: Yes: Patient Medicare AWV questionnaire was completed by the patient on 05/19/23; I have confirmed that all information answered by patient is correct and no changes since this date.  Cardiac Risk Factors include: advanced age (>62men, >49 women);obesity (BMI >30kg/m2);dyslipidemia;hypertension     Objective:    Today's Vitals   05/22/23 0907  Weight: 225 lb (102.1 kg)  Height: 5\' 7"  (1.702 m)   Body mass index is 35.24 kg/m.     05/22/2023    9:18 AM 05/19/2022    3:34 PM 10/24/2021    7:57 AM 07/05/2021    1:00 PM 07/05/2021   10:06 AM 05/14/2021   12:48 PM 06/13/2020    4:47 PM  Advanced Directives  Does Patient Have a Medical Advance Directive? Yes Yes Yes Yes Yes Yes Yes  Type of Estate agent of [place];Living will Healthcare Power of [place];Living will Healthcare Power of [place];Living will Healthcare Power of [employer] of [place];Living will Healthcare Power of [place];Living will Healthcare Power of [place];Living will  Does patient want to make changes to medical  advance directive? No - Patient declined No - Patient declined  No - Patient declined  No - Patient declined No - Patient declined  Copy of Healthcare Power of Attorney in Chart? Yes - validated most recent copy scanned in chart (See row information) Yes - validated most recent copy scanned in chart (See row information)    No - copy requested     Current Medications (verified) Outpatient Encounter Medications as of 05/22/2023  Medication Sig   amLODipine (NORVASC) 10 MG tablet TAKE ONE TABLET BY MOUTH EVERY DAY   Ascorbic Acid (VITAMIN C) 1000 MG tablet Take 1,000 mg by mouth daily.   atorvastatin (LIPITOR) 20 MG tablet TAKE ONE TABLET BY MOUTH TWO TIMES A WEEK   Cholecalciferol (VITAMIN D3) 1000 units CAPS Take 2 capsules by mouth daily.    Clotrimazole 1 % OINT Apply 1 application. topically in the morning and at bedtime.   COD LIVER OIL PO Take 1 capsule by mouth daily.    latanoprost (XALATAN) 0.005 % ophthalmic solution Place 1 drop into both eyes daily.   losartan (COZAAR) 100 MG tablet TAKE 1 TABLET BY MOUTH DAILY   Misc Natural Products (TURMERIC CURCUMIN) CAPS Take 1 capsule by mouth daily.   mometasone (ELOCON) 0.1 % lotion PLACE 2 TO 4 DROPS INTO RIGHT EAR EACH WEEK   Multiple Vitamins-Minerals (PRESERVISION AREDS 2 PO) Take 1 tablet by mouth 2 (two) times daily.   timolol (TIMOPTIC) 0.5 % ophthalmic solution INSTILL 1 DROP INTO LEFT EYE EVERY DAY   Trolamine Salicylate (ASPERCREME) 10 % LOTN    vitamin B-12 (CYANOCOBALAMIN) 1000 MCG  tablet Take 1,000 mcg by mouth daily.   No facility-administered encounter medications on file as of 05/22/2023.    Allergies (verified) Iodinated contrast media   History: Past Medical History:  Diagnosis Date   Arthritis    Colon polyps    Diverticulitis 07/05/2021   Glaucoma    Hyperlipidemia    Hypertension    Hyponatremia 07/05/2021   Obesity    Past Surgical History:  Procedure Laterality Date   ABDOMINAL HYSTERECTOMY     total,  secondary to uterine polyps, Dr. Harold Hedge   COLON SURGERY     COLONOSCOPY N/A 10/24/2021   Procedure: COLONOSCOPY;  Surgeon: Jaynie Collins, DO;  Location: [facility] ENDOSCOPY;  Service: Gastroenterology;  Laterality: N/A;   Family History  Problem Relation Age of Onset   Arthritis Mother    Arthritis Sister    Cancer Father        prostate   Diabetes Father    Diabetes Paternal Grandmother    Breast cancer Neg Hx    Social History   Socioeconomic History   Marital status: Married    Spouse name: Not on file   Number of children: Not on file   Years of education: Not on file   Highest education level: Doctorate  Occupational History   Not on file  Tobacco Use   Smoking status: Never   Smokeless tobacco: Never  Vaping Use   Vaping status: Never Used  Substance and Sexual Activity   Alcohol use: Yes    Comment: rarely   Drug use: No   Sexual activity: Not on file  Other Topics Concern   Not on file  Social History Narrative   Lives in [place] with husband, has 2 children. 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 Pets - 2 dogs.    Social Drivers of Corporate investment banker Strain: Low Risk  (05/19/2023)   Overall Financial Resource Strain (CARDIA)    Difficulty of Paying Living Expenses: Not hard at all  Food Insecurity: No Food Insecurity (05/19/2023)   Hunger Vital Sign    Worried About Running Out of Food in the Last Year: Never true    Ran Out of Food in the Last Year: Never true  Transportation Needs: No Transportation Needs (05/19/2023)   PRAPARE - Administrator, Civil Service (Medical): No    Lack of Transportation (Non-Medical): No  Physical Activity: Sufficiently Active (05/19/2023)   Exercise Vital Sign    Days of Exercise per Week: 5 days    Minutes of Exercise per Session: 40 min  Stress: No Stress Concern Present (05/19/2023)   [employer] of Occupational Health - Occupational Stress Questionnaire    Feeling of Stress : Not at all  Social  Connections: Moderately Integrated (05/19/2023)   Social Connection and Isolation Panel [NHANES]    Frequency of Communication with Friends and Family: Three times a week    Frequency of Social Gatherings with Friends and Family: Three times a week    Attends Religious Services: 1 to 4 times per year    Active Member of Clubs or Organizations: No    Attends Banker Meetings: Never    Marital Status: Married    Tobacco Counseling Counseling given: Not Answered    Clinical Intake:  Pre-visit preparation completed: Yes  Pain : No/denies pain     BMI - recorded: 35.24 Nutritional Status: BMI > 30  Obese Nutritional Risks: None Diabetes: No  How often do you need to have someone  help you when you read instructions, pamphlets, or other written materials from your doctor or pharmacy?: 1 - Never  Interpreter Needed?: No  Information entered by :: R. Belton Peplinski LPN   Activities of Daily Living     05/19/2023   12:09 PM  In your present state of health, do you have any difficulty performing the following activities:  Hearing? 1  Comment wears aids  Vision? 0  Difficulty concentrating or making decisions? 0  Walking or climbing stairs? 0  Dressing or bathing? 0  Doing errands, shopping? 0  Preparing Food and eating ? N  Using the Toilet? N  In the past six months, have you accidently leaked urine? N  Do you have problems with loss of bowel control? N  Managing your Medications? N  Managing your Finances? N  Housekeeping or managing your Housekeeping? N    Patient Care Team: Sherlene Shams, MD as PCP - General (Internal Medicine)  Indicate any recent Medical Services you may have received from other than [facility] providers in the past year (date may be approximate).     Assessment:   This is a routine wellness examination for Savannah Hood.  Hearing/Vision screen Hearing Screening - Comments:: Wears aids Vision Screening - Comments:: glasses   Goals Addressed              This Visit's Progress    Patient Stated       Wants to continue to exercise on a regular basis and eat well       Depression Screen     05/22/2023    9:13 AM 05/19/2022    3:35 PM 07/19/2021   10:50 AM 06/12/2021   10:13 AM 05/14/2021   12:51 PM 06/07/2020    4:00 PM 05/11/2020   11:40 AM  PHQ 2/9 Scores  PHQ - 2 Score 0 0 0 0 0 0 0  PHQ- 9 Score 0     1     Fall Risk     05/19/2023   12:09 PM 05/12/2022   10:34 PM 07/19/2021   10:50 AM 07/10/2021   11:02 AM 06/12/2021   10:13 AM  Fall Risk   Falls in the past year? 0 0 0 0 0  Number falls in past yr: 0      Injury with Fall? 0      Risk for fall due to : No Fall Risks  No Fall Risks No Fall Risks No Fall Risks  Follow up Falls prevention discussed;Falls evaluation completed  Falls evaluation completed Falls evaluation completed Falls evaluation completed    MEDICARE RISK AT HOME:  Medicare Risk at Home Any stairs in or around the home?: (Patient-Rptd) Yes If so, are there any without handrails?: (Patient-Rptd) No Home free of loose throw rugs in walkways, pet beds, electrical cords, etc?: (Patient-Rptd) Yes Adequate lighting in your home to reduce risk of falls?: (Patient-Rptd) Yes Life alert?: (Patient-Rptd) No Use of a cane, walker or w/c?: (Patient-Rptd) No Grab bars in the bathroom?: (Patient-Rptd) Yes Shower chair or bench in shower?: (Patient-Rptd) No Elevated toilet seat or a handicapped toilet?: (Patient-Rptd) Yes  TIMED UP AND GO:  Was the test performed?  No  Cognitive Function: 6CIT completed        05/22/2023    9:18 AM 05/19/2022    3:42 PM 05/11/2019    1:29 PM  6CIT Screen  What Year? 0 points 0 points 0 points  What month? 0 points 0 points 0 points  What time? 0 points 0 points 0 points  Count back from 20 0 points 0 points 0 points  Months in reverse 0 points 0 points 0 points  Repeat phrase 0 points 0 points 0 points  Total Score 0 points 0 points 0 points     Immunizations Immunization History  Administered Date(s) Administered   Fluad Quad(high Dose 65+) 11/19/2018, 01/01/2022   Influenza Split 01/27/2011, 01/17/2016   Influenza, High Dose Seasonal PF 01/09/2017, 12/22/2017   Influenza,inj,Quad PF,6+ Mos 03/15/2013, 12/15/2013   Influenza-Unspecified 12/26/2019, 12/20/2020, 01/29/2023   PFIZER(Purple Top)SARS-COV-2 Vaccination 04/14/2019, 05/05/2019, 12/19/2019, 06/29/2020   Pfizer Covid-19 Vaccine Bivalent Booster 74yrs & up 12/28/2020, 12/24/2021   Pneumococcal Conjugate-13 08/28/2015   Pneumococcal Polysaccharide-23 03/11/2013   Respiratory Syncytial Virus Vaccine,Recomb Aduvanted(Arexvy) 01/08/2022   Tdap 01/27/2011, 11/10/2020, 01/04/2021   Zoster Recombinant(Shingrix) 09/09/2017, 12/02/2017   Zoster, Live 11/20/2011    Screening Tests Health Maintenance  Topic Date Due   COVID-19 Vaccine (7 - 2024-25 season) 11/23/2022   Medicare Annual Wellness (AWV)  05/20/2023   MAMMOGRAM  02/26/2024   Colonoscopy  10/26/2026   DTaP/Tdap/Td (4 - Td or Tdap) 01/05/2031   Pneumonia Vaccine 3+ Years old  Completed   INFLUENZA VACCINE  Completed   DEXA SCAN  Completed   Hepatitis C Screening  Completed   Zoster Vaccines- Shingrix  Completed   HPV VACCINES  Aged Out    Health Maintenance  Health Maintenance Due  Topic Date Due   COVID-19 Vaccine (7 - 2024-25 season) 11/23/2022   Medicare Annual Wellness (AWV)  05/20/2023   Health Maintenance Items Addressed: See Nurse Notes  Additional Screening:  Vision Screening: Recommended annual ophthalmology exams for early detection of glaucoma and other disorders of the eye. Up to date [facility]  Dental Screening: Recommended annual dental exams for proper oral hygiene  Community Resource Referral / Chronic Care Management: CRR required this visit?  No   CCM required this visit?  No     Plan:     I have personally reviewed and noted the following in the patient's chart:    Medical and social history Use of alcohol, tobacco or illicit drugs  Current medications and supplements including opioid prescriptions. Patient is not currently taking opioid prescriptions. Functional ability and status Nutritional status Physical activity Advanced directives List of other physicians Hospitalizations, surgeries, and ER visits in previous 12 months Vitals Screenings to include cognitive, depression, and falls Referrals and appointments  In addition, I have reviewed and discussed with patient certain preventive protocols, quality metrics, and best practice recommendations. A written personalized care plan for preventive services as well as general preventive health recommendations were provided to patient.     Sydell Axon, LPN   1/61/0960   After Visit Summary: (MyChart) Due to this being a telephonic visit, the after visit summary with patients personalized plan was offered to patient via MyChart   Notes: Nothing significant to report at this time.

## 2023-05-25 ENCOUNTER — Other Ambulatory Visit: Payer: Self-pay | Admitting: Internal Medicine

## 2023-06-10 ENCOUNTER — Other Ambulatory Visit: Payer: Self-pay | Admitting: Internal Medicine

## 2023-06-22 ENCOUNTER — Encounter: Payer: Medicare HMO | Admitting: Internal Medicine

## 2023-07-06 ENCOUNTER — Encounter: Payer: Self-pay | Admitting: Internal Medicine

## 2023-07-06 ENCOUNTER — Ambulatory Visit: Payer: Medicare HMO | Admitting: Internal Medicine

## 2023-07-06 DIAGNOSIS — I10 Essential (primary) hypertension: Secondary | ICD-10-CM | POA: Diagnosis not present

## 2023-07-06 DIAGNOSIS — E6609 Other obesity due to excess calories: Secondary | ICD-10-CM

## 2023-07-06 DIAGNOSIS — E66812 Obesity, class 2: Secondary | ICD-10-CM | POA: Diagnosis not present

## 2023-07-06 DIAGNOSIS — R7301 Impaired fasting glucose: Secondary | ICD-10-CM | POA: Diagnosis not present

## 2023-07-06 DIAGNOSIS — I700 Atherosclerosis of aorta: Secondary | ICD-10-CM

## 2023-07-06 DIAGNOSIS — Z0000 Encounter for general adult medical examination without abnormal findings: Secondary | ICD-10-CM

## 2023-07-06 DIAGNOSIS — E559 Vitamin D deficiency, unspecified: Secondary | ICD-10-CM

## 2023-07-06 DIAGNOSIS — E7849 Other hyperlipidemia: Secondary | ICD-10-CM | POA: Diagnosis not present

## 2023-07-06 DIAGNOSIS — N183 Chronic kidney disease, stage 3 unspecified: Secondary | ICD-10-CM

## 2023-07-06 DIAGNOSIS — Z6836 Body mass index (BMI) 36.0-36.9, adult: Secondary | ICD-10-CM | POA: Diagnosis not present

## 2023-07-06 MED ORDER — ATORVASTATIN CALCIUM 20 MG PO TABS: ORAL_TABLET | ORAL | 1 refills | Status: DC

## 2023-07-06 NOTE — Patient Instructions (Addendum)
 By now you have probably received the message from [facility] about a miscalculation on the test that measures the albumin to creatinine ratio I your urine which helps us  determine if early kidney disease is present. I have reviewed your most recent screening test for nephropathy, which was done 2 years ago . The  corrected  calculation is still normal, so  no medication changes are needed

## 2023-07-06 NOTE — Assessment & Plan Note (Signed)
 Renal function is at  baseline with avoidance of NSAIDs.  She is on an ARB for control of hypertension, and avoiding NSAID use. Using turmeric  Lab Results  Component Value Date   CREATININE 0.99 12/18/2022   Lab Results  Component Value Date   NA 140 12/18/2022   K 4.7 12/18/2022   CL 107 12/18/2022   CO2 27 12/18/2022

## 2023-07-06 NOTE — Assessment & Plan Note (Signed)
Taking 2000 IUs daily

## 2023-07-06 NOTE — Progress Notes (Signed)
 Patient ID: Savannah Hood, female    DOB: 03/22/1946  Age: 78 y.o. MRN: [id number]  The patient is here for annual preventive examination and management of other chronic and acute problems.   The risk factors are reflected in the social history.  The roster of all physicians providing medical care to patient - is listed in the Snapshot section of the chart.  Activities of daily living:  The patient is 100% independent in all ADLs: dressing, toileting, feeding as well as independent mobility  Home safety : The patient has smoke detectors in the home. They wear seatbelts.  There are no firearms at home. There is no violence in the home.   There is no risks for hepatitis, STDs or HIV. There is no   history of blood transfusion. They have no travel history to infectious disease endemic areas of the world.  The patient has seen their dentist in the last six month. They have seen their eye doctor in the last year. They admit to slight hearing difficulty with regard to whispered voices and some television programs.  They have deferred audiologic testing in the last year.  They do not  have excessive sun exposure. Discussed the need for sun protection: hats, long sleeves and use of sunscreen if there is significant sun exposure.   Diet: the importance of a healthy diet is discussed. They do have a healthy diet.  The benefits of regular aerobic exercise were discussed. She rides a stationery bike 5 days per week    30 minutes daily    Depression screen: there are no signs or vegative symptoms of depression- irritability, change in appetite, anhedonia, sadness/tearfullness.  Cognitive assessment: the patient manages all their financial and personal affairs and is actively engaged. They could relate day,date,year and events; recalled 2/3 objects at 3 minutes; performed clock-face test normally.  The following portions of the patient's history were reviewed and updated as appropriate: allergies,  current medications, past family history, past medical history,  past surgical history, past social history  and problem list.  Visual acuity was not assessed per patient preference since she has regular follow up with her ophthalmologist. Hearing and body mass index were assessed and reviewed.   During the course of the visit the patient was educated and counseled about appropriate screening and preventive services including : fall prevention , diabetes screening, nutrition counseling, colorectal cancer screening, and recommended immunizations.    CC: The primary encounter diagnosis was Routine general medical examination at a health care facility. Diagnoses of Primary hypertension, Familial hyperlipidemia, high LDL, Impaired fasting glucose, Class 2 obesity due to excess calories without serious comorbidity with body mass index (BMI) of 36.0 to 36.9 in adult, Stage 3 chronic kidney disease, unspecified whether stage 3a or 3b CKD (HCC), Vitamin D deficiency, and Abdominal aortic atherosclerosis (HCC) were also pertinent to this visit.  No NEW OR unresolved issues   History Darlen has a past medical history of Allergy (About 40 years ago), Arthritis, Cataract (About 5 years ago), Chronic kidney disease (several years ago), Colon polyps, Diverticulitis (07/05/2021), Glaucoma, Hyperlipidemia, Hypertension, Hyponatremia (07/05/2021), and Obesity.   She has a past surgical history that includes Abdominal hysterectomy; Colon surgery; and Colonoscopy (N/A, 10/24/2021).   Her family history includes Arthritis in her mother and sister; Cancer in her father; Diabetes in her father and paternal grandmother; Hearing loss in her father and mother; Hypertension in her father and mother; Kidney disease in her father; Obesity in her maternal grandmother  and paternal grandfather; Stroke in her father; Vision loss in her maternal grandfather.She reports that she has never smoked. She has never used smokeless tobacco.  She reports that she does not currently use alcohol. She reports that she does not use drugs.  Outpatient Medications Prior to Visit  Medication Sig Dispense Refill   amLODipine (NORVASC) 10 MG tablet TAKE ONE TABLET BY MOUTH EVERY DAY 90 tablet 3   Ascorbic Acid (VITAMIN C) 1000 MG tablet Take 1,000 mg by mouth daily.     Cholecalciferol (VITAMIN D3) 1000 units CAPS Take 2 capsules by mouth daily.      Clotrimazole 1 % OINT Apply 1 application. topically in the morning and at bedtime.     COD LIVER OIL PO Take 1 capsule by mouth daily.      latanoprost (XALATAN) 0.005 % ophthalmic solution Place 1 drop into both eyes daily.     losartan (COZAAR) 100 MG tablet TAKE 1 TABLET BY MOUTH DAILY 90 tablet 1   Misc Natural Products (TURMERIC CURCUMIN) CAPS Take 1 capsule by mouth daily.     mometasone (ELOCON) 0.1 % lotion PLACE 2 TO 4 DROPS INTO RIGHT EAR EACH WEEK     Multiple Vitamins-Minerals (PRESERVISION AREDS 2 PO) Take 1 tablet by mouth 2 (two) times daily.     timolol (TIMOPTIC) 0.5 % ophthalmic solution INSTILL 1 DROP INTO LEFT EYE EVERY DAY  6   Trolamine Salicylate (ASPERCREME) 10 % LOTN      vitamin B-12 (CYANOCOBALAMIN) 1000 MCG tablet Take 1,000 mcg by mouth daily.     atorvastatin (LIPITOR) 20 MG tablet TAKE ONE TABLET BY MOUTH TWO TIMES A WEEK 26 tablet 0   No facility-administered medications prior to visit.    Review of Systems  Patient denies headache, fevers, malaise, unintentional weight loss, skin rash, eye pain, sinus congestion and sinus pain, sore throat, dysphagia,  hemoptysis , cough, dyspnea, wheezing, chest pain, palpitations, orthopnea, edema, abdominal pain, nausea, melena, diarrhea, constipation, flank pain, dysuria, hematuria, urinary  Frequency, nocturia, numbness, tingling, seizures,  Focal weakness, Loss of consciousness,  Tremor, insomnia, depression, anxiety, and suicidal ideation.     Objective:  There were no vitals taken for this visit.  Physical  Exam Vitals reviewed.  Constitutional:      General: She is not in acute distress.    Appearance: Normal appearance. She is well-developed. She is obese. She is not ill-appearing, toxic-appearing or diaphoretic.  HENT:     Head: Normocephalic.     Right Ear: Tympanic membrane, ear canal and external ear normal. There is no impacted cerumen.     Left Ear: Tympanic membrane, ear canal and external ear normal. There is no impacted cerumen.     Nose: Nose normal.     Mouth/Throat:     Mouth: Mucous membranes are moist.     Pharynx: Oropharynx is clear.  Eyes:     General: No scleral icterus.       Right eye: No discharge.        Left eye: No discharge.     Conjunctiva/sclera: Conjunctivae normal.     Pupils: Pupils are equal, round, and reactive to light.  Neck:     Thyroid: No thyromegaly.     Vascular: No carotid bruit or JVD.  Cardiovascular:     Rate and Rhythm: Normal rate and regular rhythm.     Heart sounds: Normal heart sounds.  Pulmonary:     Effort: Pulmonary effort is normal. No respiratory  distress.     Breath sounds: Normal breath sounds.  Chest:  Breasts:    Breasts are symmetrical.     Right: Normal. No swelling, inverted nipple, mass, nipple discharge, skin change or tenderness.     Left: Normal. No swelling, inverted nipple, mass, nipple discharge, skin change or tenderness.  Abdominal:     General: Bowel sounds are normal.     Palpations: Abdomen is soft. There is no mass.     Tenderness: There is no abdominal tenderness. There is no guarding or rebound.  Musculoskeletal:        General: Normal range of motion.     Cervical back: Normal range of motion and neck supple.  Lymphadenopathy:     Cervical: No cervical adenopathy.     Upper Body:     Right upper body: No supraclavicular, axillary or pectoral adenopathy.     Left upper body: No supraclavicular, axillary or pectoral adenopathy.  Skin:    General: Skin is warm and dry.  Neurological:     General:  No focal deficit present.     Mental Status: She is alert and oriented to person, place, and time. Mental status is at baseline.  Psychiatric:        Mood and Affect: Mood normal.        Behavior: Behavior normal.        Thought Content: Thought content normal.        Judgment: Judgment normal.    Assessment & Plan:  Routine general medical examination at a health care facility Assessment & Plan: age appropriate education and counseling updated, referrals for preventative services and immunizations addressed, dietary and smoking counseling addressed, most recent labs reviewed.  I have personally reviewed and have noted:   1) the patient's medical and social history 2) The pt's use of alcohol, tobacco, and illicit drugs 3) The patient's current medications and supplements 4) Functional ability including ADL's, fall risk, home safety risk, hearing and visual impairment 5) Diet and physical activities 6) Evidence for depression or mood disorder 7) The patient's height, weight, and BMI have been recorded in the chart    I have made referrals, and provided counseling and education based on review of the above    Primary hypertension Assessment & Plan: Well controlled on current regimen. Renal function stable, no changes today.   Orders: -     Comprehensive metabolic panel with GFR -     Microalbumin / creatinine urine ratio  Familial hyperlipidemia, high LDL -     Lipid panel -     LDL cholesterol, direct  Impaired fasting glucose -     Comprehensive metabolic panel with GFR -     Hemoglobin A1c  Class 2 obesity due to excess calories without serious comorbidity with body mass index (BMI) of 36.0 to 36.9 in adult -     CBC with Differential/Platelet -     TSH  Stage 3 chronic kidney disease, unspecified whether stage 3a or 3b CKD (HCC) Assessment & Plan: Renal function is at  baseline with avoidance of NSAIDs.  She is on an ARB for control of hypertension, and avoiding NSAID  use. Using turmeric  Lab Results  Component Value Date   CREATININE 0.99 12/18/2022   Lab Results  Component Value Date   NA 140 12/18/2022   K 4.7 12/18/2022   CL 107 12/18/2022   CO2 27 12/18/2022      Vitamin D deficiency Assessment & Plan: Taking 2000  IUs daily .  Will prescribe the weekly megadose for 3 months   Orders: -     VITAMIN D 25 Hydroxy (Vit-D Deficiency, Fractures)  Abdominal aortic atherosclerosis (HCC) Assessment & Plan: Reviewed findings of prior CT scan today..  Patient is tolerating high potency statin therapy  WITH ATORVASTATIN  20 MG    Other orders -     Atorvastatin Calcium; TAKE ONE TABLET BY MOUTH TWO TIMES A WEEK  Dispense: 26 tablet; Refill: 1 -     Ergocalciferol; Take 1 capsule (50,000 Units total) by mouth once a week.  Dispense: 12 capsule; Refill: 0      I provided 40 minutes of  face-to-face time during this encounter reviewing patient's current problems and past surgeries,  recent labs and imaging studies, providing counseling on the above mentioned problems , and coordination  of care .   Follow-up: No follow-ups on file.   Thersia Flax, MD

## 2023-07-06 NOTE — Assessment & Plan Note (Signed)
 Reviewed findings of prior CT scan today..  Patient is tolerating high potency statin therapy  WITH ATORVASTATIN  20 MG

## 2023-07-06 NOTE — Assessment & Plan Note (Signed)

## 2023-07-06 NOTE — Assessment & Plan Note (Signed)
Well controlled on current regimen. Renal function stable, no changes today. 

## 2023-07-07 ENCOUNTER — Encounter: Payer: Self-pay | Admitting: Internal Medicine

## 2023-07-07 LAB — COMPREHENSIVE METABOLIC PANEL WITH GFR
ALT: 14 U/L (ref 0–35)
AST: 20 U/L (ref 0–37)
Albumin: 4 g/dL (ref 3.5–5.2)
Alkaline Phosphatase: 83 U/L (ref 39–117)
BUN: 19 mg/dL (ref 6–23)
CO2: 24 meq/L (ref 19–32)
Calcium: 9 mg/dL (ref 8.4–10.5)
Chloride: 104 meq/L (ref 96–112)
Creatinine, Ser: 1.02 mg/dL (ref 0.40–1.20)
GFR: 53.65 mL/min — ABNORMAL LOW (ref >60.00)
Glucose, Bld: 115 mg/dL — ABNORMAL HIGH (ref 70–99)
Potassium: 4.3 meq/L (ref 3.5–5.1)
Sodium: 136 meq/L (ref 135–145)
Total Bilirubin: 0.7 mg/dL (ref 0.2–1.2)
Total Protein: 6.4 g/dL (ref 6.0–8.3)

## 2023-07-07 LAB — CBC WITH DIFFERENTIAL/PLATELET
Basophils Absolute: 0 10*3/uL (ref 0.0–0.1)
Basophils Relative: 0.9 % (ref 0.0–3.0)
Eosinophils Absolute: 0.1 10*3/uL (ref 0.0–0.7)
Eosinophils Relative: 2.5 % (ref 0.0–5.0)
HCT: 39.1 % (ref 36.0–46.0)
Hemoglobin: 13.3 g/dL (ref 12.0–15.0)
Lymphocytes Relative: 14 % (ref 12.0–46.0)
Lymphs Abs: 0.8 10*3/uL (ref 0.7–4.0)
MCHC: 34.1 g/dL (ref 30.0–36.0)
MCV: 93.4 fl (ref 78.0–100.0)
Monocytes Absolute: 0.5 10*3/uL (ref 0.1–1.0)
Monocytes Relative: 8.2 % (ref 3.0–12.0)
Neutro Abs: 4.3 10*3/uL (ref 1.4–7.7)
Neutrophils Relative %: 74.4 % (ref 43.0–77.0)
Platelets: 194 10*3/uL (ref 150.0–400.0)
RBC: 4.19 Mil/uL (ref 3.87–5.11)
RDW: 13.1 % (ref 11.5–15.5)
WBC: 5.8 10*3/uL (ref 4.0–10.5)

## 2023-07-07 LAB — VITAMIN D 25 HYDROXY (VIT D DEFICIENCY, FRACTURES): VITD: 21.97 ng/mL — ABNORMAL LOW (ref 30.00–100.00)

## 2023-07-07 LAB — LIPID PANEL
Cholesterol: 164 mg/dL (ref 0–200)
HDL: 52 mg/dL (ref >39.00)
LDL Cholesterol: 86 mg/dL (ref 0–99)
NonHDL: 111.57
Total CHOL/HDL Ratio: 3
Triglycerides: 126 mg/dL (ref 0.0–149.0)
VLDL: 25.2 mg/dL (ref 0.0–40.0)

## 2023-07-07 LAB — MICROALBUMIN / CREATININE URINE RATIO
Creatinine,U: 119.2 mg/dL
Microalb Creat Ratio: 55.3 mg/g — ABNORMAL HIGH (ref 0.0–30.0)
Microalb, Ur: 6.6 mg/dL — ABNORMAL HIGH (ref 0.0–1.9)

## 2023-07-07 LAB — LDL CHOLESTEROL, DIRECT: Direct LDL: 90 mg/dL

## 2023-07-07 LAB — TSH: TSH: 1.65 u[IU]/mL (ref 0.35–5.50)

## 2023-07-07 LAB — HEMOGLOBIN A1C: Hgb A1c MFr Bld: 5.5 % (ref 4.6–6.5)

## 2023-07-07 MED ORDER — ERGOCALCIFEROL 1.25 MG (50000 UT) PO CAPS: 50000.0000 [IU] | ORAL_CAPSULE | ORAL | 0 refills | Status: DC

## 2023-07-07 NOTE — Telephone Encounter (Signed)
 NOTED

## 2023-07-07 NOTE — Addendum Note (Signed)
 Addended by: Thersia Flax on: 07/07/2023 02:04 PM   Modules accepted: Orders

## 2023-08-13 DIAGNOSIS — J301 Allergic rhinitis due to pollen: Secondary | ICD-10-CM | POA: Diagnosis not present

## 2023-08-13 DIAGNOSIS — H903 Sensorineural hearing loss, bilateral: Secondary | ICD-10-CM | POA: Diagnosis not present

## 2023-08-13 DIAGNOSIS — H6983 Other specified disorders of Eustachian tube, bilateral: Secondary | ICD-10-CM | POA: Diagnosis not present

## 2023-09-17 DIAGNOSIS — H353132 Nonexudative age-related macular degeneration, bilateral, intermediate dry stage: Secondary | ICD-10-CM | POA: Diagnosis not present

## 2023-09-17 DIAGNOSIS — H401131 Primary open-angle glaucoma, bilateral, mild stage: Secondary | ICD-10-CM | POA: Diagnosis not present

## 2023-09-17 DIAGNOSIS — H2513 Age-related nuclear cataract, bilateral: Secondary | ICD-10-CM | POA: Diagnosis not present

## 2023-12-21 ENCOUNTER — Encounter: Payer: Self-pay | Admitting: Internal Medicine

## 2023-12-22 ENCOUNTER — Other Ambulatory Visit: Payer: Self-pay | Admitting: Internal Medicine

## 2023-12-28 ENCOUNTER — Other Ambulatory Visit: Payer: Self-pay | Admitting: Internal Medicine

## 2023-12-28 DIAGNOSIS — Z1231 Encounter for screening mammogram for malignant neoplasm of breast: Secondary | ICD-10-CM

## 2023-12-31 ENCOUNTER — Ambulatory Visit: Admitting: Internal Medicine

## 2024-01-11 ENCOUNTER — Ambulatory Visit: Admitting: Internal Medicine

## 2024-02-11 DIAGNOSIS — H6123 Impacted cerumen, bilateral: Secondary | ICD-10-CM | POA: Diagnosis not present

## 2024-02-11 DIAGNOSIS — H6063 Unspecified chronic otitis externa, bilateral: Secondary | ICD-10-CM | POA: Diagnosis not present

## 2024-02-11 DIAGNOSIS — H6983 Other specified disorders of Eustachian tube, bilateral: Secondary | ICD-10-CM | POA: Diagnosis not present

## 2024-02-11 DIAGNOSIS — J301 Allergic rhinitis due to pollen: Secondary | ICD-10-CM | POA: Diagnosis not present

## 2024-02-11 DIAGNOSIS — H903 Sensorineural hearing loss, bilateral: Secondary | ICD-10-CM | POA: Diagnosis not present

## 2024-02-12 ENCOUNTER — Ambulatory Visit: Admitting: Internal Medicine

## 2024-02-12 ENCOUNTER — Encounter: Payer: Self-pay | Admitting: Internal Medicine

## 2024-02-12 VITALS — BP 126/68 | HR 78 | Ht 67.0 in | Wt 224.0 lb

## 2024-02-12 DIAGNOSIS — Z6836 Body mass index (BMI) 36.0-36.9, adult: Secondary | ICD-10-CM

## 2024-02-12 DIAGNOSIS — E6609 Other obesity due to excess calories: Secondary | ICD-10-CM

## 2024-02-12 DIAGNOSIS — E559 Vitamin D deficiency, unspecified: Secondary | ICD-10-CM

## 2024-02-12 DIAGNOSIS — E7849 Other hyperlipidemia: Secondary | ICD-10-CM | POA: Diagnosis not present

## 2024-02-12 DIAGNOSIS — R7301 Impaired fasting glucose: Secondary | ICD-10-CM | POA: Diagnosis not present

## 2024-02-12 DIAGNOSIS — I10 Essential (primary) hypertension: Secondary | ICD-10-CM | POA: Diagnosis not present

## 2024-02-12 DIAGNOSIS — E66812 Obesity, class 2: Secondary | ICD-10-CM

## 2024-02-12 DIAGNOSIS — N1831 Chronic kidney disease, stage 3a: Secondary | ICD-10-CM

## 2024-02-12 LAB — LIPID PANEL
Cholesterol: 150 mg/dL (ref 0–200)
HDL: 58.6 mg/dL (ref >39.00)
LDL Cholesterol: 71 mg/dL (ref 0–99)
NonHDL: 91.81
Total CHOL/HDL Ratio: 3
Triglycerides: 106 mg/dL (ref 0.0–149.0)
VLDL: 21.2 mg/dL (ref 0.0–40.0)

## 2024-02-12 LAB — COMPREHENSIVE METABOLIC PANEL WITH GFR
ALT: 14 U/L (ref 0–35)
AST: 20 U/L (ref 0–37)
Albumin: 4 g/dL (ref 3.5–5.2)
Alkaline Phosphatase: 90 U/L (ref 39–117)
BUN: 21 mg/dL (ref 6–23)
CO2: 27 meq/L (ref 19–32)
Calcium: 9 mg/dL (ref 8.4–10.5)
Chloride: 106 meq/L (ref 96–112)
Creatinine, Ser: 0.91 mg/dL (ref 0.40–1.20)
GFR: 61.26 mL/min (ref >60.00)
Glucose, Bld: 99 mg/dL (ref 70–99)
Potassium: 4.6 meq/L (ref 3.5–5.1)
Sodium: 139 meq/L (ref 135–145)
Total Bilirubin: 0.7 mg/dL (ref 0.2–1.2)
Total Protein: 6.4 g/dL (ref 6.0–8.3)

## 2024-02-12 LAB — VITAMIN D 25 HYDROXY (VIT D DEFICIENCY, FRACTURES): VITD: 19.12 ng/mL — ABNORMAL LOW (ref 30.00–100.00)

## 2024-02-12 LAB — MICROALBUMIN / CREATININE URINE RATIO
Creatinine,U: 164 mg/dL
Microalb Creat Ratio: UNDETERMINED mg/g (ref 0.0–30.0)
Microalb, Ur: <0.7 mg/dL

## 2024-02-12 LAB — LDL CHOLESTEROL, DIRECT: Direct LDL: 66 mg/dL

## 2024-02-12 MED ORDER — LOSARTAN POTASSIUM 100 MG PO TABS: 100.0000 mg | ORAL_TABLET | Freq: Every day | ORAL | 3 refills | Status: AC

## 2024-02-12 MED ORDER — ATORVASTATIN CALCIUM 20 MG PO TABS: ORAL_TABLET | ORAL | 3 refills | Status: AC

## 2024-02-12 NOTE — Progress Notes (Unsigned)
 Subjective:  Patient ID: Savannah Hood, female    DOB: Apr 28, 1945  Age: 78 y.o. MRN: [id number]  CC: The primary encounter diagnosis was Stage 3a chronic kidney disease (HCC). Diagnoses of Primary hypertension, Impaired fasting glucose, Familial hyperlipidemia, high LDL, Vitamin D  deficiency, and Class 2 obesity due to excess calories without serious comorbidity with body mass index (BMI) of 36.0 to 36.9 in adult were also pertinent to this visit.   HPI Ashayla Subia presents for  Chief Complaint  Patient presents with   Medical Management of Chronic Issues    1) obesity:  has lost 10 lbs since last visit.  Using prepared meal programs to limit calories meal size ,  exercising for 45 minutes 5 days per week .  No knee or ankle pain   2) Hypertension: patient checks blood pressure twice weekly at home.  Readings have been for the most part <130/80 at rest . Patient is following a reduced salt diet most days and is taking medications as prescribed   Outpatient Medications Prior to Visit  Medication Sig Dispense Refill   amLODipine  (NORVASC ) 10 MG tablet TAKE ONE TABLET BY MOUTH EVERY DAY 90 tablet 3   Ascorbic Acid (VITAMIN C) 1000 MG tablet Take 1,000 mg by mouth daily.     Cholecalciferol (VITAMIN D3) 1000 units CAPS Take 2 capsules by mouth daily.      Clotrimazole 1 % OINT Apply 1 application. topically in the morning and at bedtime.     COD LIVER OIL PO Take 1 capsule by mouth daily.      latanoprost  (XALATAN ) 0.005 % ophthalmic solution Place 1 drop into both eyes daily.     Misc Natural Products (TURMERIC CURCUMIN) CAPS Take 1 capsule by mouth daily.     mometasone (ELOCON) 0.1 % lotion PLACE 2 TO 4 DROPS INTO RIGHT EAR EACH WEEK     Multiple Vitamins-Minerals (PRESERVISION AREDS 2 PO) Take 1 tablet by mouth 2 (two) times daily.     timolol  (TIMOPTIC ) 0.5 % ophthalmic solution INSTILL 1 DROP INTO LEFT EYE EVERY DAY  6   Trolamine Salicylate (ASPERCREME) 10 % LOTN       vitamin B-12 (CYANOCOBALAMIN) 1000 MCG tablet Take 1,000 mcg by mouth daily.     atorvastatin  (LIPITOR) 20 MG tablet TAKE ONE TABLET BY MOUTH TWO TIMES A WEEK 26 tablet 1   losartan  (COZAAR ) 100 MG tablet TAKE 1 TABLET BY MOUTH DAILY 90 tablet 1   ergocalciferol  (DRISDOL ) 1.25 MG (50000 UT) capsule Take 1 capsule (50,000 Units total) by mouth once a week. 12 capsule 0   No facility-administered medications prior to visit.    Review of Systems;  Patient denies headache, fevers, malaise, unintentional weight loss, skin rash, eye pain, sinus congestion and sinus pain, sore throat, dysphagia,  hemoptysis , cough, dyspnea, wheezing, chest pain, palpitations, orthopnea, edema, abdominal pain, nausea, melena, diarrhea, constipation, flank pain, dysuria, hematuria, urinary  Frequency, nocturia, numbness, tingling, seizures,  Focal weakness, Loss of consciousness,  Tremor, insomnia, depression, anxiety, and suicidal ideation.      Objective:  BP 126/68   Pulse 78   Ht 5' 7 (1.702 m)   Wt 224 lb (101.6 kg)   SpO2 96%   BMI 35.08 kg/m   BP Readings from Last 3 Encounters:  02/12/24 126/68  12/18/22 120/70  06/16/22 128/70    Wt Readings from Last 3 Encounters:  02/12/24 224 lb (101.6 kg)  05/22/23 225 lb (102.1 kg)  12/18/22  225 lb 6.4 oz (102.2 kg)    Physical Exam Vitals reviewed.  Constitutional:      General: She is not in acute distress.    Appearance: Normal appearance. She is normal weight. She is not ill-appearing, toxic-appearing or diaphoretic.  HENT:     Head: Normocephalic.  Eyes:     General: No scleral icterus.       Right eye: No discharge.        Left eye: No discharge.     Conjunctiva/sclera: Conjunctivae normal.  Cardiovascular:     Rate and Rhythm: Normal rate and regular rhythm.     Heart sounds: Normal heart sounds.  Pulmonary:     Effort: Pulmonary effort is normal. No respiratory distress.     Breath sounds: Normal breath sounds.  Musculoskeletal:         General: Normal range of motion.  Skin:    General: Skin is warm and dry.  Neurological:     General: No focal deficit present.     Mental Status: She is alert and oriented to person, place, and time. Mental status is at baseline.  Psychiatric:        Mood and Affect: Mood normal.        Behavior: Behavior normal.        Thought Content: Thought content normal.        Judgment: Judgment normal.     Lab Results  Component Value Date   HGBA1C 5.5 07/06/2023   HGBA1C 5.4 07/04/2022   HGBA1C 5.4 06/07/2020    Lab Results  Component Value Date   CREATININE 0.91 02/12/2024   CREATININE 1.02 07/06/2023   CREATININE 0.99 12/18/2022    Lab Results  Component Value Date   WBC 5.8 07/06/2023   HGB 13.3 07/06/2023   HCT 39.1 07/06/2023   PLT 194.0 07/06/2023   GLUCOSE 99 02/12/2024   CHOL 150 02/12/2024   TRIG 106.0 02/12/2024   HDL 58.60 02/12/2024   LDLDIRECT 66.0 02/12/2024   LDLCALC 71 02/12/2024   ALT 14 02/12/2024   AST 20 02/12/2024   NA 139 02/12/2024   K 4.6 02/12/2024   CL 106 02/12/2024   CREATININE 0.91 02/12/2024   BUN 21 02/12/2024   CO2 27 02/12/2024   TSH 1.65 07/06/2023   HGBA1C 5.5 07/06/2023   MICROALBUR <0.7 02/12/2024    Mammogram 3D SCREEN BREAST BILATERAL Result Date: 02/27/2023 CLINICAL DATA:  Screening. EXAM: DIGITAL SCREENING BILATERAL MAMMOGRAM WITH TOMOSYNTHESIS AND CAD TECHNIQUE: Bilateral screening digital craniocaudal and mediolateral oblique mammograms were obtained. Bilateral screening digital breast tomosynthesis was performed. The images were evaluated with computer-aided detection. COMPARISON:  Previous exam(s). ACR Breast Density Category b: There are scattered areas of fibroglandular density. FINDINGS: There are no findings suspicious for malignancy. IMPRESSION: No mammographic evidence of malignancy. A result letter of this screening mammogram will be mailed directly to the patient. RECOMMENDATION: Screening mammogram in one  year. (Code:SM-B-01Y) BI-RADS CATEGORY  1: Negative. Electronically Signed   By: Inocente Ast M.D.   On: 02/27/2023 15:57   DG Bone Density Result Date: 02/26/2023 EXAM: DUAL X-RAY ABSORPTIOMETRY (DXA) FOR BONE MINERAL DENSITY IMPRESSION: Your patient Enriqueta Augusta completed a BMD test on 02/26/2023 using the [employer] iDXA DXA System (software version: 14.10) manufactured by [employer]. The following summarizes the results of our evaluation. Technologist: SCE PATIENT BIOGRAPHICAL: Name: Alonzo, Loving Patient ID: [id number] Birth Date: May 27, 1945 Height: 66.0 in. Gender: Female Exam Date: 02/26/2023 Weight: 231.6 lbs. Indications:  Advanced Age, Caucasian, Height Loss, History of Fracture (Adult), Hysterectomy, Kidney Disease, Oophorectomy Bilateral, Parent Hip Fracture, Postmenopausal, Vitamin D  Deficiency, Family Hist. (Parent hip fracture) Fractures: Ankle, WRIST Treatments: Vitamin D  DENSITOMETRY RESULTS: Site      Region     Measured Date Measured Age WHO Classification Young Adult T-score BMD         %Change vs. Previous Significant Change (*) AP Spine L1-L4 (L2) 02/26/2023 75.5 Normal 2.2 1.461 g/cm2 -6.8% Yes AP Spine L1-L4 (L2) 07/02/2020 72.9 Normal 3.1 1.568 g/cm2 - - DualFemur Neck Left 02/26/2023 75.5 Osteopenia -1.9 0.771 g/cm2 0.9% - DualFemur Neck Left 07/02/2020 72.9 Osteopenia -2.0 0.764 g/cm2 - - DualFemur Total Mean 02/26/2023 75.5 Normal -1.0 0.877 g/cm2 -6.6% Yes DualFemur Total Mean 07/02/2020 72.9 Normal -0.5 0.939 g/cm2 - - ASSESSMENT: The BMD measured at Femur Neck Left is 0.771 g/cm2 with a T-score of -1.9.This patient is considered osteopenic according to World Health Organization [facility]) criteria. The scan quality is good. L2 was excluded due to degenerative changes. Compared with prior study, there has been a significant decrease in the spine. Compared with prior study, there has been a significant decrease in the total hip. World Science Writer [facility]) criteria  for post-menopausal, Caucasian Women: Normal:                   T-score at or above -1 SD Osteopenia/low bone mass: T-score between -1 and -2.5 SD Osteoporosis:             T-score at or below -2.5 SD RECOMMENDATIONS: 1. All patients should optimize calcium  and vitamin D  intake. 2. Consider FDA-approved medical therapies in postmenopausal women and men aged 53 years and older, based on the following: a. A hip or vertebral(clinical or morphometric) fracture b. T-score < -2.5 at the femoral neck or spine after appropriate evaluation to exclude secondary causes c. Low bone mass (T-score between -1.0 and -2.5 at the femoral neck or spine) and a 10-year probability of a hip fracture > 3% or a 10-year probability of a major osteoporosis-related fracture > 20% based on the [place] -adapted WHO algorithm 3. Clinician judgment and/or patient preferences may indicate treatment for people with 10-year fracture probabilities above or below these levels FOLLOW-UP: People with diagnosed cases of osteoporosis or at high risk for fracture should have regular bone mineral density tests. For patients eligible for Medicare, routine testing is allowed once every 2 years. The testing frequency can be increased to one year for patients who have rapidly progressing disease, those who are receiving or discontinuing medical therapy to restore bone mass, or have additional risk factors. I have reviewed this report, and agree with the above findings. [facility] Dear VERNEITA LITTIE KETTERING, Your patient ARNELL MAUSOLF completed a FRAX assessment on 02/26/2023 using the [facility] iDXA DXA System (analysis version: 14.10) manufactured by [employer]. The following summarizes the results of our evaluation. PATIENT BIOGRAPHICAL: Name: Margerite, Impastato Patient ID: [id number] Birth Date: 07-Jul-1945 Height:    66.0 in. Gender:     [age]    [age]       Weight:    231.6 lbs. Ethnicity:  White                            Exam Date:  02/26/2023 FRAX* RESULTS:  (version: 3.5) 10-year Probability of Fracture1 Major Osteoporotic Fracture2 Hip Fracture 29.6% 16.2% Population: [place]  (Caucasian)  Risk Factors: History of Fracture (Adult), Family Hist. (Parent hip fracture) Based on Femur (Left) Neck BMD 1 -The 10-year probability of fracture may be lower than reported if the patient has received treatment. 2 -Major Osteoporotic Fracture: Clinical Spine, Forearm, Hip or Shoulder *FRAX is a armed forces logistics/support/administrative officer of the [employer] of [employer] for Metabolic Bone Disease, a World Science Writer (WHO) [employer]. ASSESSMENT: The probability of a major osteoporotic fracture is 29.6% within the next ten years. The probability of a hip fracture is 16.2% within the next ten years. . Electronically Signed   By: Norman Hopper M.D.   On: 02/26/2023 11:28    Assessment & Plan:  .Stage 3a chronic kidney disease (HCC) -     Protein electrophoresis, serum -     IFE AND PE, RANDOM URINE -     Microalbumin / creatinine urine ratio  Primary hypertension Assessment & Plan: Well controlled on current regimen. Renal function stable, no changes today.   Lab Results  Component Value Date   CREATININE 0.91 02/12/2024   Lab Results  Component Value Date   NA 139 02/12/2024   K 4.6 02/12/2024   CL 106 02/12/2024   CO2 27 02/12/2024     Orders: -     Comprehensive metabolic panel with GFR  Impaired fasting glucose -     Comprehensive metabolic panel with GFR  Familial hyperlipidemia, high LDL -     LDL cholesterol, direct -     Lipid panel  Vitamin D  deficiency Assessment & Plan: Recurrent..  replacing with mega dose weekly  Orders: -     VITAMIN D  25 Hydroxy (Vit-D Deficiency, Fractures)  Class 2 obesity due to excess calories without serious comorbidity with body mass index (BMI) of 36.0 to 36.9 in adult Assessment & Plan: I have congratulated her in reduction of   BMI and encouraged  Continued weight loss  with goal of 10% of body weigh over the next 6 months using a low glycemic index diet and regular exercise a minimum of 5 days per week.     Other orders -     Atorvastatin  Calcium ; TAKE ONE TABLET BY MOUTH TWO TIMES A WEEK  Dispense: 26 tablet; Refill: 3 -     Losartan  Potassium; Take 1 tablet (100 mg total) by mouth daily.  Dispense: 90 tablet; Refill: 3 -     Ergocalciferol ; Take 1 capsule (50,000 Units total) by mouth once a week.  Dispense: 12 capsule; Refill: 0     I personally spent a total of 34 minutes in the care of the patient today including getting/reviewing separately obtained history, counseling and educating, placing orders, documenting clinical information in the EHR, and independently interpreting results .  Follow-up: Return in about 6 months (around 08/11/2024).   Verneita LITTIE Kettering, MD

## 2024-02-12 NOTE — Patient Instructions (Addendum)
 You are losing weight on your own with portion reduction and regular exercise (lifestyle changes!!)   If you want pharmacotherapy down the road, today we discussed an alternative to the GLP 1 agonists for weight loss called Contrave that is effective for helping  emotional eaters  lose or maintain previous weight loss. It is available for $100/month through [employer]  (patient support program by [employer] )  and will come from their mail order pharmacy .  If you are interested in starting this medication,  let me known scan the qr code on the handout,  which will take you to their website to set up your account.    Contrave contains wellbutrin and naltrexone.

## 2024-02-14 ENCOUNTER — Ambulatory Visit: Payer: Self-pay | Admitting: Internal Medicine

## 2024-02-14 MED ORDER — ERGOCALCIFEROL 1.25 MG (50000 UT) PO CAPS: 50000.0000 [IU] | ORAL_CAPSULE | ORAL | 0 refills | Status: AC

## 2024-02-14 NOTE — Assessment & Plan Note (Signed)
 Recurrent..  replacing with mega dose weekly

## 2024-02-14 NOTE — Assessment & Plan Note (Signed)
 I have congratulated her in reduction of   BMI and encouraged  Continued weight loss with goal of 10% of body weigh over the next 6 months using a low glycemic index diet and regular exercise a minimum of 5 days per week.

## 2024-02-14 NOTE — Assessment & Plan Note (Signed)
 Well controlled on current regimen. Renal function stable, no changes today.   Lab Results  Component Value Date   CREATININE 0.91 02/12/2024   Lab Results  Component Value Date   NA 139 02/12/2024   K 4.6 02/12/2024   CL 106 02/12/2024   CO2 27 02/12/2024

## 2024-02-16 LAB — PROTEIN ELECTROPHORESIS, SERUM
Albumin ELP: 3.8 g/dL (ref 3.8–4.8)
Alpha 1: 0.3 g/dL (ref 0.2–0.3)
Alpha 2: 0.7 g/dL (ref 0.5–0.9)
Beta 2: 0.3 g/dL (ref 0.2–0.5)
Beta Globulin: 0.4 g/dL (ref 0.4–0.6)
Gamma Globulin: 0.9 g/dL (ref 0.8–1.7)
Total Protein: 6.5 g/dL (ref 6.1–8.1)

## 2024-02-29 ENCOUNTER — Ambulatory Visit
Admission: RE | Admit: 2024-02-29 | Discharge: 2024-02-29 | Disposition: A | Source: Ambulatory Visit | Attending: Internal Medicine | Admitting: Internal Medicine

## 2024-02-29 DIAGNOSIS — Z1231 Encounter for screening mammogram for malignant neoplasm of breast: Secondary | ICD-10-CM | POA: Diagnosis not present

## 2024-03-14 ENCOUNTER — Encounter: Payer: Self-pay | Admitting: Internal Medicine

## 2024-05-25 ENCOUNTER — Ambulatory Visit: Payer: Medicare HMO

## 2024-08-12 ENCOUNTER — Ambulatory Visit: Admitting: Internal Medicine
